# Patient Record
Sex: Female | Born: 1995
Health system: Southern US, Community
[De-identification: ages and names within clinical notes are randomized; demographics above are authoritative.]

## PROBLEM LIST (undated history)

## (undated) DIAGNOSIS — K5090 Crohn's disease, unspecified, without complications: Secondary | ICD-10-CM

---

## 2018-03-16 ENCOUNTER — Emergency Department (HOSPITAL_COMMUNITY): Payer: Self-pay

## 2018-03-16 ENCOUNTER — Encounter (HOSPITAL_COMMUNITY): Payer: Self-pay

## 2018-03-16 ENCOUNTER — Emergency Department (HOSPITAL_COMMUNITY)
Admission: EM | Admit: 2018-03-16 | Discharge: 2018-03-16 | Disposition: A | Discharge status: Home / Self Care | Payer: Self-pay | Attending: Emergency Medicine | Admitting: Emergency Medicine

## 2018-03-16 ENCOUNTER — Other Ambulatory Visit: Payer: Self-pay

## 2018-03-16 DIAGNOSIS — E8809 Other disorders of plasma-protein metabolism, not elsewhere classified: Secondary | ICD-10-CM

## 2018-03-16 DIAGNOSIS — R770 Abnormality of albumin: Secondary | ICD-10-CM | POA: Insufficient documentation

## 2018-03-16 DIAGNOSIS — Z79899 Other long term (current) drug therapy: Secondary | ICD-10-CM | POA: Insufficient documentation

## 2018-03-16 DIAGNOSIS — D649 Anemia, unspecified: Secondary | ICD-10-CM

## 2018-03-16 DIAGNOSIS — R112 Nausea with vomiting, unspecified: Secondary | ICD-10-CM

## 2018-03-16 DIAGNOSIS — R197 Diarrhea, unspecified: Secondary | ICD-10-CM | POA: Insufficient documentation

## 2018-03-16 LAB — URINALYSIS, ROUTINE W REFLEX MICROSCOPIC
Bilirubin Urine: NEGATIVE
Glucose, UA: NEGATIVE mg/dL
Hgb urine dipstick: NEGATIVE
Ketones, ur: 5 mg/dL — AB
Leukocytes, UA: NEGATIVE
Nitrite: NEGATIVE
Protein, ur: 30 mg/dL — AB
Specific Gravity, Urine: 1.025 (ref 1.005–1.030)
pH: 6 (ref 5.0–8.0)

## 2018-03-16 LAB — CBC
HCT: 30.6 % — ABNORMAL LOW (ref 36.0–46.0)
Hemoglobin: 9.9 g/dL — ABNORMAL LOW (ref 12.0–15.0)
MCH: 27.3 pg (ref 26.0–34.0)
MCHC: 32.4 g/dL (ref 30.0–36.0)
MCV: 84.3 fL (ref 78.0–100.0)
Platelets: 421 10*3/uL — ABNORMAL HIGH (ref 150–400)
RBC: 3.63 MIL/uL — ABNORMAL LOW (ref 3.87–5.11)
RDW: 14.6 % (ref 11.5–15.5)
WBC: 5.4 10*3/uL (ref 4.0–10.5)

## 2018-03-16 LAB — TYPE AND SCREEN
ABO/RH(D): O POS
Antibody Screen: NEGATIVE

## 2018-03-16 LAB — RETICULOCYTES
RBC.: 3.56 MIL/uL — ABNORMAL LOW (ref 3.87–5.11)
Retic Count, Absolute: 35.6 10*3/uL (ref 19.0–186.0)
Retic Ct Pct: 1 % (ref 0.4–3.1)

## 2018-03-16 LAB — COMPREHENSIVE METABOLIC PANEL
ALT: 9 U/L — ABNORMAL LOW (ref 14–54)
AST: 11 U/L — ABNORMAL LOW (ref 15–41)
Albumin: 1.9 g/dL — ABNORMAL LOW (ref 3.5–5.0)
Alkaline Phosphatase: 80 U/L (ref 38–126)
Anion gap: 7 (ref 5–15)
BUN: 5 mg/dL — ABNORMAL LOW (ref 6–20)
CO2: 28 mmol/L (ref 22–32)
Calcium: 8.1 mg/dL — ABNORMAL LOW (ref 8.9–10.3)
Chloride: 100 mmol/L — ABNORMAL LOW (ref 101–111)
Creatinine, Ser: 0.54 mg/dL (ref 0.44–1.00)
GFR calc Af Amer: >60 mL/min (ref >60)
GFR calc non Af Amer: >60 mL/min (ref >60)
Glucose, Bld: 76 mg/dL (ref 65–99)
Potassium: 3.6 mmol/L (ref 3.5–5.1)
Sodium: 135 mmol/L (ref 135–145)
Total Bilirubin: 0.7 mg/dL (ref 0.3–1.2)
Total Protein: 6.5 g/dL (ref 6.5–8.1)

## 2018-03-16 LAB — IRON AND TIBC
Iron: 34 ug/dL (ref 28–170)
Saturation Ratios: 33 % — ABNORMAL HIGH (ref 10.4–31.8)
TIBC: 102 ug/dL — ABNORMAL LOW (ref 250–450)
UIBC: 68 ug/dL

## 2018-03-16 LAB — FOLATE: Folate: 7.4 ng/mL (ref >5.9)

## 2018-03-16 LAB — LIPASE, BLOOD: Lipase: 19 U/L (ref 11–51)

## 2018-03-16 LAB — ABO/RH: ABO/RH(D): O POS

## 2018-03-16 LAB — I-STAT BETA HCG BLOOD, ED (MC, WL, AP ONLY): I-stat hCG, quantitative: <5 m[IU]/mL (ref <5)

## 2018-03-16 LAB — RAPID HIV SCREEN (HIV 1/2 AB+AG)
HIV 1/2 Antibodies: NONREACTIVE
HIV-1 P24 Antigen - HIV24: NONREACTIVE

## 2018-03-16 LAB — FERRITIN: Ferritin: 212 ng/mL (ref 11–307)

## 2018-03-16 LAB — TSH: TSH: 1.657 u[IU]/mL (ref 0.350–4.500)

## 2018-03-16 LAB — VITAMIN B12: Vitamin B-12: 849 pg/mL (ref 180–914)

## 2018-03-16 LAB — POC OCCULT BLOOD, ED: Fecal Occult Bld: NEGATIVE

## 2018-03-16 MED ORDER — PREDNISONE 20 MG PO TABS: 40.0000 mg | ORAL_TABLET | Freq: Every day | ORAL | 0 refills | Status: DC

## 2018-03-16 MED ORDER — PREDNISONE 20 MG PO TABS
60.0000 mg | ORAL_TABLET | ORAL | Status: AC
  Administered 2018-03-16: 60 mg via ORAL
  Filled 2018-03-16: qty 3

## 2018-03-16 MED ORDER — SODIUM CHLORIDE 0.9 % IV SOLN
Freq: Once | INTRAVENOUS | Status: AC
Start: 2018-03-16 — End: 2018-03-16
  Administered 2018-03-16: 15:00:00 via INTRAVENOUS

## 2018-03-16 MED ORDER — ONDANSETRON HCL 4 MG/2ML IJ SOLN: 4.0000 mg | Freq: Once | INTRAMUSCULAR | Status: DC

## 2018-03-16 MED ORDER — IOPAMIDOL (ISOVUE-300) INJECTION 61%
INTRAVENOUS | Status: AC
  Administered 2018-03-16: 100 mL
  Filled 2018-03-16: qty 100

## 2018-03-16 MED ORDER — PANTOPRAZOLE SODIUM 20 MG PO TBEC: 20.0000 mg | DELAYED_RELEASE_TABLET | Freq: Two times a day (BID) | ORAL | 0 refills | Status: DC

## 2018-03-16 MED ORDER — DICYCLOMINE HCL 20 MG PO TABS: 20.0000 mg | ORAL_TABLET | Freq: Two times a day (BID) | ORAL | 0 refills | Status: DC

## 2018-03-16 NOTE — ED Notes (Signed)
EDP at bedside  

## 2018-03-16 NOTE — ED Provider Notes (Signed)
[facility] EMERGENCY DEPARTMENT Provider Note   CSN: [id number] Arrival date & time: 03/16/18  1130     History   Chief Complaint Chief Complaint  Patient presents with  . Abdominal Pain  . Emesis    HPI Jennifer Cain is a 22 y.o. female.  HPI   22 yo F with PMHx below here here with abd pain, emesis. Pt reports that for the past 3-4 months, she's had progressively worsening cramp like, diffuse abd pain. Pain comes and goes, is worse after eating. She's been unable to eat/dirnk due to this and has been drinking protein shakes only. She's had >30 lb weight loss. No night sweats. No alleviating factors. She's also had diarrhea, occasionally black/melanotic. No h/o ulcers. No h/o blood transfusions. No h/o anemia. No recent travel outside the [place]. No dietary limitations/new diets.  History reviewed. No pertinent past medical history.  There are no active problems to display for this patient.   History reviewed. No pertinent surgical history.   OB History   None      Home Medications    Prior to Admission medications   Medication Sig Start Date End Date Taking? Authorizing Provider  pantoprazole (PROTONIX) 40 MG tablet Take 40 mg by mouth 2 (two) times daily.    Yes [provider]    Family History No family history on file.  Social History Social History   Tobacco Use  . Smoking status: Never Smoker  . Smokeless tobacco: Never Used  Substance Use Topics  . Alcohol use: Never    Frequency: Never  . Drug use: Never     Allergies   Patient has no known allergies.   Review of Systems Review of Systems  Constitutional: Positive for appetite change, fatigue and unexpected weight change. Negative for chills and fever.  HENT: Negative for congestion, rhinorrhea and sore throat.   Eyes: Negative for visual disturbance.  Respiratory: Positive for shortness of breath. Negative for cough and wheezing.   Cardiovascular: Positive for leg  swelling. Negative for chest pain.  Gastrointestinal: Positive for abdominal pain, diarrhea, nausea and vomiting.  Genitourinary: Negative for dysuria, flank pain, vaginal bleeding and vaginal discharge.  Musculoskeletal: Negative for neck pain.  Skin: Negative for rash.  Allergic/Immunologic: Negative for immunocompromised state.  Neurological: Positive for weakness. Negative for syncope and headaches.  Hematological: Does not bruise/bleed easily.  All other systems reviewed and are negative.    Physical Exam Updated Vital Signs BP 114/82   Pulse 98   Temp 98.7 F (37.1 C) (Oral)   Resp 16   Ht 5' 4"  (1.626 m)   Wt 59 kg (130 lb)   LMP 02/08/2018   SpO2 100%   BMI 22.31 kg/m   Physical Exam  Constitutional: She is oriented to person, place, and time. She appears well-developed and well-nourished. She has a sickly appearance. No distress.  HENT:  Head: Normocephalic and atraumatic.  Subconj pallor  Eyes: Conjunctivae are normal.  Neck: Neck supple.  Cardiovascular: Normal rate, regular rhythm and normal heart sounds. Exam reveals no friction rub.  No murmur heard. Pulmonary/Chest: Effort normal and breath sounds normal. No respiratory distress. She has no wheezes. She has no rales.  Abdominal: She exhibits no distension. There is generalized tenderness. There is no rigidity, no rebound and no guarding.  Musculoskeletal: She exhibits no edema.  Neurological: She is alert and oriented to person, place, and time. She exhibits normal muscle tone.  Skin: Skin is warm. Capillary refill takes  less than 2 seconds. There is pallor.  Psychiatric: She has a normal mood and affect.  Nursing note and vitals reviewed.    ED Treatments / Results  Labs (all labs ordered are listed, but only abnormal results are displayed) Labs Reviewed  COMPREHENSIVE METABOLIC PANEL - Abnormal; Notable for the following components:      Result Value   Chloride 100 (*)    BUN 5 (*)    Calcium 8.1  (*)    Albumin 1.9 (*)    AST 11 (*)    ALT 9 (*)    All other components within normal limits  CBC - Abnormal; Notable for the following components:   RBC 3.63 (*)    Hemoglobin 9.9 (*)    HCT 30.6 (*)    Platelets 421 (*)    All other components within normal limits  URINALYSIS, ROUTINE W REFLEX MICROSCOPIC - Abnormal; Notable for the following components:   Color, Urine AMBER (*)    Ketones, ur 5 (*)    Protein, ur 30 (*)    Bacteria, UA FEW (*)    All other components within normal limits  LIPASE, BLOOD  TSH  RAPID HIV SCREEN (HIV 1/2 AB+AG)  VITAMIN B12  FOLATE  IRON AND TIBC  FERRITIN  RETICULOCYTES  I-STAT BETA HCG BLOOD, ED ([facility], [facility], [facility] ONLY)  POC OCCULT BLOOD, ED  TYPE AND SCREEN  ABO/RH    EKG None  Radiology No results found.  Procedures Procedures (including critical care time)  Medications Ordered in ED Medications  ondansetron (ZOFRAN) injection 4 mg (0 mg Intravenous Hold 03/16/18 1537)  0.9 %  sodium chloride infusion ( Intravenous New Bag/Given 03/16/18 1528)  iopamidol (ISOVUE-300) 61 % injection (100 mLs  Contrast Given 03/16/18 1627)     Initial Impression / Assessment and Plan / ED Course  I have reviewed the triage vital signs and the nursing notes.  Pertinent labs & imaging results that were available during my care of the patient were reviewed by me and considered in my medical decision making (see chart for details).     22 yo F with no known PMhx here with weakness, fatigue, weight loss, and abd pain with n/v/d. On exam, pt appears pale, fatigued, but non-toxic. Abdomen with diffuse TTP. Lab work obtained and is c/f significant malnutrition with albumin of 1.9. CBC also shows anemia with Hgb 9.9 - last value was 12 per records, but this was several months ago. UA with ketonuria and 6-10 WBCs, but pt has no significant dysuria, frequency.   ETiology of weight loss, anemia unclear. DDx includes underlying IBD, celiac's, PUD. Given duration  of sx and extent, will check CT. Will also send anemia panel. If CT is w/o surgical abnormality, suspect pt can be managed with antacids, outpt GI follow-up. I discussed the extreme importance of f/u with GI with pt, family in detail.  Patient care transferred to Dr. Vanita Panda at the end of my shift. Patient presentation, ED course, and plan of care discussed with review of all pertinent labs and imaging. Please see his/her note for further details regarding further ED course and disposition.   Final Clinical Impressions(s) / ED Diagnoses   Final diagnoses:  Nausea vomiting and diarrhea  Anemia, unspecified type  Hypoalbuminemia    ED Discharge Orders    None       Duffy Bruce, MD 03/16/18 [phone]

## 2018-03-16 NOTE — ED Notes (Signed)
Patient transported to CT 

## 2018-03-16 NOTE — ED Triage Notes (Signed)
PT reports abdominal pain, n/v, diarrhea x several months. Pt reports having burning in legs and unable to stand for long period of time.

## 2018-03-16 NOTE — Discharge Instructions (Signed)
As discussed, today's evaluation has been generally reassuring, though there is some consideration of an inflammatory condition in your intestines. It is important that you take medication as directed, and be sure to follow-up with our gastroenterology colleagues. Please call 1 of the groups listed above, to arrange for follow-up care.  Return here for concerning changes in your condition.

## 2018-03-16 NOTE — ED Notes (Signed)
Unsuccessful IV attempt x2.  

## 2018-03-16 NOTE — ED Notes (Signed)
EDP at beside attempting IV [place]

## 2018-03-16 NOTE — ED Provider Notes (Signed)
6:26 PM Patient is awake alert, sitting upright, accompanied by her parents. We discussed CT results, prior findings, prior evaluation. Patient has not seen a gastroenterologist since her initial presentation to another healthcare facility. CT suggests inflammatory bowel condition, consistent with patient's history of ongoing gastrointestinal issues, nausea, vomiting, diarrhea, weight loss. No evidence for malignancy, mass. Patient remains hemodynamically unremarkable. Patient will start Bentyl, steroids, follow-up with GI.   Carmin Muskrat, MD 03/16/18 1827

## 2018-03-16 NOTE — ED Notes (Signed)
Patient verbalizes understanding of discharge instructions. Opportunity for questioning and answers were provided. Armband removed by staff, pt discharged from ED.  

## 2018-04-12 ENCOUNTER — Encounter (HOSPITAL_COMMUNITY): Payer: Self-pay

## 2018-04-12 ENCOUNTER — Other Ambulatory Visit: Payer: Self-pay

## 2018-04-12 ENCOUNTER — Emergency Department (HOSPITAL_COMMUNITY)
Admission: EM | Admit: 2018-04-12 | Discharge: 2018-04-12 | Disposition: A | Discharge status: Home / Self Care | Payer: Self-pay | Attending: Emergency Medicine | Admitting: Emergency Medicine

## 2018-04-12 ENCOUNTER — Emergency Department (HOSPITAL_COMMUNITY): Payer: Self-pay

## 2018-04-12 DIAGNOSIS — R1010 Upper abdominal pain, unspecified: Secondary | ICD-10-CM | POA: Insufficient documentation

## 2018-04-12 DIAGNOSIS — Z79899 Other long term (current) drug therapy: Secondary | ICD-10-CM | POA: Insufficient documentation

## 2018-04-12 DIAGNOSIS — R109 Unspecified abdominal pain: Secondary | ICD-10-CM

## 2018-04-12 DIAGNOSIS — R112 Nausea with vomiting, unspecified: Secondary | ICD-10-CM | POA: Insufficient documentation

## 2018-04-12 LAB — COMPREHENSIVE METABOLIC PANEL
ALT: 7 U/L — ABNORMAL LOW (ref 14–54)
AST: 9 U/L — ABNORMAL LOW (ref 15–41)
Albumin: 2.2 g/dL — ABNORMAL LOW (ref 3.5–5.0)
Alkaline Phosphatase: 72 U/L (ref 38–126)
Anion gap: 10 (ref 5–15)
BUN: 6 mg/dL (ref 6–20)
CO2: 25 mmol/L (ref 22–32)
Calcium: 8.9 mg/dL (ref 8.9–10.3)
Chloride: 101 mmol/L (ref 101–111)
Creatinine, Ser: 0.71 mg/dL (ref 0.44–1.00)
GFR calc Af Amer: >60 mL/min (ref >60)
GFR calc non Af Amer: >60 mL/min (ref >60)
Glucose, Bld: 75 mg/dL (ref 65–99)
Potassium: 4.1 mmol/L (ref 3.5–5.1)
Sodium: 136 mmol/L (ref 135–145)
Total Bilirubin: 0.8 mg/dL (ref 0.3–1.2)
Total Protein: 7.7 g/dL (ref 6.5–8.1)

## 2018-04-12 LAB — CBC
HCT: 32.4 % — ABNORMAL LOW (ref 36.0–46.0)
Hemoglobin: 10.4 g/dL — ABNORMAL LOW (ref 12.0–15.0)
MCH: 27.5 pg (ref 26.0–34.0)
MCHC: 32.1 g/dL (ref 30.0–36.0)
MCV: 85.7 fL (ref 78.0–100.0)
Platelets: 582 10*3/uL — ABNORMAL HIGH (ref 150–400)
RBC: 3.78 MIL/uL — ABNORMAL LOW (ref 3.87–5.11)
RDW: 16.4 % — ABNORMAL HIGH (ref 11.5–15.5)
WBC: 5.2 10*3/uL (ref 4.0–10.5)

## 2018-04-12 LAB — I-STAT BETA HCG BLOOD, ED (MC, WL, AP ONLY): I-stat hCG, quantitative: <5 m[IU]/mL (ref <5)

## 2018-04-12 LAB — LIPASE, BLOOD: Lipase: 19 U/L (ref 11–51)

## 2018-04-12 MED ORDER — ONDANSETRON 4 MG PO TBDP
4.0000 mg | ORAL_TABLET | Freq: Once | ORAL | Status: AC
  Administered 2018-04-12: 4 mg via ORAL
  Filled 2018-04-12: qty 1

## 2018-04-12 MED ORDER — ONDANSETRON 4 MG PO TBDP: 4.0000 mg | ORAL_TABLET | Freq: Three times a day (TID) | ORAL | 0 refills | Status: DC | PRN

## 2018-04-12 NOTE — ED Triage Notes (Signed)
Pt states she was seen here last month and was diagnosed with Chron's disease. Pt states over the last month she has had increased fatigue, vomiting, and just general malaise. Pt appears fatigued in triage. Slightly tachycardic but afebrile.

## 2018-04-12 NOTE — Discharge Instructions (Signed)
Please read and follow all provided instructions.  Your diagnoses today include:  1. Pain of upper abdomen   2. Abdominal pain   3. Non-intractable vomiting with nausea, unspecified vomiting type    Tests performed today include:  Blood counts and electrolytes  Blood tests to check liver and kidney function  Test for pregnancy (in women)  Vital signs. See below for your results today.   Medications prescribed:   Zofran (ondansetron) - for nausea and vomiting  Take any prescribed medications only as directed.  Home care instructions:   Follow any educational materials contained in this packet.  Follow-up instructions: Please follow-up with your primary care provider in the next 3 days for further evaluation of your symptoms.    Return instructions:  SEEK IMMEDIATE MEDICAL ATTENTION IF:  The pain does not go away or becomes severe   A temperature above 101F develops   Repeated vomiting occurs (multiple episodes)   The pain becomes localized to portions of the abdomen. The right side could possibly be appendicitis. In an adult, the left lower portion of the abdomen could be colitis or diverticulitis.   Blood is being passed in stools or vomit (bright red or black tarry stools)   You develop chest pain, difficulty breathing, dizziness or fainting, or become confused, poorly responsive, or inconsolable (young children)  If you have any other emergent concerns regarding your health  Additional Information: Abdominal (belly) pain can be caused by many things. Your caregiver performed an examination and possibly ordered blood/urine tests and imaging (CT scan, x-rays, ultrasound). Many cases can be observed and treated at home after initial evaluation in the emergency department. Even though you are being discharged home, abdominal pain can be unpredictable. Therefore, you need a repeated exam if your pain does not resolve, returns, or worsens. Most patients with abdominal pain  don't have to be admitted to the hospital or have surgery, but serious problems like appendicitis and gallbladder attacks can start out as nonspecific pain. Many abdominal conditions cannot be diagnosed in one visit, so follow-up evaluations are very important.  Your vital signs today were: BP 119/89 (BP Location: Right Arm)    Pulse 99    Temp 98.3 F (36.8 C) (Oral)    Resp 16    LMP 02/10/2018 (Within Days)    SpO2 100%  If your blood pressure (bp) was elevated above 135/85 this visit, please have this repeated by your doctor within one month. --------------

## 2018-04-12 NOTE — ED Provider Notes (Signed)
[facility] EMERGENCY DEPARTMENT Provider Note   CSN: [id number] Arrival date & time: 04/12/18  1039     History   Chief Complaint Chief Complaint  Patient presents with  . Abdominal Pain    HPI Jennifer Cain is a 22 y.o. female.  Patient with no past surgical history presents to the emergency department with complaint of abdominal pain, nausea, and vomiting.  Patient was seen in the emergency department on 03/16/2018.  At that time she had a CT scan showing inflammatory changes.  Patient states that that time she was told she may have Crohn's disease.  She has not followed up with a GI physician and is never had a definitive diagnosis of this.  However, she continues to feel nauseous and have intermittent episodes of vomiting.  She is able to drink some fluids.  She denies any fevers.  Pain is described as waxing and waning with radiation into the chest.  It is in the upper abdomen.  No lower abdominal pain.  No diarrhea, bloody stools, or melena.  No urinary symptoms.  No easy bruising or bleeding reported.  At her last visit patient was prescribed Bentyl, prednisone, Protonix.  She states that these have helped "a little bit".  She states that she called at the 3 GI referral that she was given and was told that she needed a referral.  She does not have a primary care doctor. Denies heavy NSAID or EtOH use.      History reviewed. No pertinent past medical history.  There are no active problems to display for this patient.   History reviewed. No pertinent surgical history.   OB History   None      Home Medications    Prior to Admission medications   Medication Sig Start Date End Date Taking? Authorizing Provider  dicyclomine (BENTYL) 20 MG tablet Take 1 tablet (20 mg total) by mouth 2 (two) times daily. 03/16/18   Carmin Muskrat, MD  pantoprazole (PROTONIX) 20 MG tablet Take 1 tablet (20 mg total) by mouth 2 (two) times daily. 03/16/18   Carmin Muskrat, MD   predniSONE (DELTASONE) 20 MG tablet Take 2 tablets (40 mg total) by mouth daily with breakfast. For the next four days 03/16/18   Carmin Muskrat, MD    Family History History reviewed. No pertinent family history.  Social History Social History   Tobacco Use  . Smoking status: Never Smoker  . Smokeless tobacco: Never Used  Substance Use Topics  . Alcohol use: Never    Frequency: Never  . Drug use: Never     Allergies   Patient has no known allergies.   Review of Systems Review of Systems  Constitutional: Negative for fever.  HENT: Negative for rhinorrhea and sore throat.   Eyes: Negative for redness.  Respiratory: Negative for cough.   Cardiovascular: Positive for chest pain (radiation from abdomen).  Gastrointestinal: Positive for abdominal pain, nausea and vomiting. Negative for diarrhea.  Genitourinary: Negative for dysuria.  Musculoskeletal: Negative for myalgias.  Skin: Negative for rash.  Neurological: Negative for headaches.     Physical Exam Updated Vital Signs BP 119/89 (BP Location: Right Arm)   Pulse 99   Temp 98.3 F (36.8 C) (Oral)   Resp 16   LMP 02/10/2018 (Within Days)   SpO2 100%   Physical Exam  Constitutional: She appears well-developed and well-nourished.  HENT:  Head: Normocephalic and atraumatic.  Eyes: Conjunctivae are normal. Right eye exhibits no discharge. Left eye  exhibits no discharge.  Neck: Normal range of motion. Neck supple.  Cardiovascular: Normal rate, regular rhythm and normal heart sounds.  Pulmonary/Chest: Effort normal and breath sounds normal.  Abdominal: Soft. Bowel sounds are normal. There is tenderness (mild) in the right upper quadrant, epigastric area and left upper quadrant. There is no rebound, no guarding, no CVA tenderness, no tenderness at McBurney's point and negative Murphy's sign.  Neurological: She is alert.  Skin: Skin is warm and dry.  Psychiatric: She has a normal mood and affect.  Nursing note and  vitals reviewed.    ED Treatments / Results  Labs (all labs ordered are listed, but only abnormal results are displayed) Labs Reviewed  COMPREHENSIVE METABOLIC PANEL - Abnormal; Notable for the following components:      Result Value   Albumin 2.2 (*)    AST 9 (*)    ALT 7 (*)    All other components within normal limits  CBC - Abnormal; Notable for the following components:   RBC 3.78 (*)    Hemoglobin 10.4 (*)    HCT 32.4 (*)    RDW 16.4 (*)    Platelets 582 (*)    All other components within normal limits  LIPASE, BLOOD  URINALYSIS, ROUTINE W REFLEX MICROSCOPIC  I-STAT BETA HCG BLOOD, ED ([facility], [facility], [facility] ONLY)    EKG None  Radiology No results found.  Procedures Procedures (including critical care time)  Medications Ordered in ED Medications  ondansetron (ZOFRAN-ODT) disintegrating tablet 4 mg (4 mg Oral Given 04/12/18 1614)     Initial Impression / Assessment and Plan / ED Course  I have reviewed the triage vital signs and the nursing notes.  Pertinent labs & imaging results that were available during my care of the patient were reviewed by me and considered in my medical decision making (see chart for details).     Patient seen and examined.  Labs reviewed.  Given that pain includes the right upper quadrant and is intermittent in nature, will evaluate for gallstones with right upper quadrant ultrasound.  Will give Zofran and oral fluid challenge.  Labs today are reassuring. They seem to be at the patient's baseline with mild anemia. She has low albumin and c/o weight loss.  Unfortunately we are towards the end of an appropriate emergency department work-up and patient will need to be seen by GI for further evaluation. Hopefully she will be able to establish care with referral today. I am not sure why they didn't schedule patient for an appointment after her last visit if she did indeed call.   If patient is eating and drinking and ultrasound is reassuring, plan for  discharge to home with antiemetics, GI and primary care follow-up.  Vital signs reviewed and are as follows: BP 119/89 (BP Location: Right Arm)   Pulse 99   Temp 98.3 F (36.8 C) (Oral)   Resp 16   LMP 02/10/2018 (Within Days)   SpO2 100%   5:57 PM  Patient has been waiting several hours for her ultrasound.  She states that she would like to leave.  We will discharged home with Zofran.  She will continue other medications that she is taking at home as needed.  Patient has not had any additional.vomiting here.  Also given referrals for primary care and GI.  The patient was urged to return to the Emergency Department immediately with worsening of current symptoms, worsening abdominal pain, persistent vomiting, blood noted in stools, fever, or any other concerns.  The patient verbalized understanding.    Final Clinical Impressions(s) / ED Diagnoses   Final diagnoses:  Abdominal pain  Pain of upper abdomen  Non-intractable vomiting with nausea, unspecified vomiting type   Abd pain: Patient with persistent symptoms that are now chronic in nature.  Patient needs GI follow-up.  Labs today are unremarkable.  Reviewed previous CT scan.  We will continue to treat symptoms the best we can.  No indications for emergent hospitalization or GI consultation.  Referrals given.  Return instructions as above.   ED Discharge Orders        Ordered    ondansetron (ZOFRAN ODT) 4 MG disintegrating tablet  Every 8 hours PRN     04/12/18 1752       Carlisle Cater, PA-C 04/12/18 Mikel Cella, MD 04/12/18 2104

## 2018-04-12 NOTE — ED Notes (Signed)
Pt made aware of need for urine sample. Pt given urine specimen cup, but unable to provide sample at this time.

## 2018-04-12 NOTE — ED Notes (Signed)
Patient verbalizes understanding of discharge instructions. Opportunity for questioning and answers were provided. Armband removed by staff, pt discharged from ED.  

## 2018-05-06 ENCOUNTER — Ambulatory Visit: Payer: Self-pay | Admitting: Internal Medicine

## 2018-06-17 ENCOUNTER — Encounter: Payer: Self-pay | Admitting: Internal Medicine

## 2018-06-17 ENCOUNTER — Ambulatory Visit: Payer: Self-pay | Attending: Internal Medicine | Admitting: Internal Medicine

## 2018-06-17 VITALS — BP 128/88 | HR 106 | Temp 98.5°F | Resp 16 | Ht 63.0 in | Wt 109.0 lb

## 2018-06-17 DIAGNOSIS — R0989 Other specified symptoms and signs involving the circulatory and respiratory systems: Secondary | ICD-10-CM | POA: Insufficient documentation

## 2018-06-17 DIAGNOSIS — R634 Abnormal weight loss: Secondary | ICD-10-CM | POA: Insufficient documentation

## 2018-06-17 DIAGNOSIS — R109 Unspecified abdominal pain: Secondary | ICD-10-CM | POA: Insufficient documentation

## 2018-06-17 DIAGNOSIS — G8929 Other chronic pain: Secondary | ICD-10-CM | POA: Insufficient documentation

## 2018-06-17 DIAGNOSIS — R918 Other nonspecific abnormal finding of lung field: Secondary | ICD-10-CM | POA: Insufficient documentation

## 2018-06-17 DIAGNOSIS — N911 Secondary amenorrhea: Secondary | ICD-10-CM | POA: Insufficient documentation

## 2018-06-17 DIAGNOSIS — Z79899 Other long term (current) drug therapy: Secondary | ICD-10-CM | POA: Insufficient documentation

## 2018-06-17 DIAGNOSIS — D649 Anemia, unspecified: Secondary | ICD-10-CM | POA: Insufficient documentation

## 2018-06-17 LAB — POCT URINE PREGNANCY: Preg Test, Ur: NEGATIVE

## 2018-06-17 MED ORDER — MEDROXYPROGESTERONE ACETATE 10 MG PO TABS: 10.0000 mg | ORAL_TABLET | Freq: Every day | ORAL | 0 refills | Status: DC

## 2018-06-17 MED FILL — MEDROXYPROGESTERONE 10 MG T: 10 | 7 days supply | Qty: 7 | Fill #0

## 2018-06-17 NOTE — Progress Notes (Signed)
Pt states she hasn't had her menstrual since February

## 2018-06-17 NOTE — Progress Notes (Signed)
Patient ID: Jennifer Cain, female    DOB: [birth year]  MRN: [id number]  CC: New Patient (Initial Visit) and Hospitalization Follow-up (ED f/u)   Subjective: Jennifer Cain is a 22 y.o. female who presents for ER follow-up and to establish with me as PCP. Her concerns today include:   Pt c/o intermittent severe abdominal pains x 2 mths -In epigastric and lower abdomin BL -occurs with meals, no particular type of foods -vomiting and diarrhea sometimes with the pain. No blood in stools, no fever.  No international travel in past 6 mths. She does not drink water from [place] or [place].  + wgh loss of 21 lbs since April.   No bloating or excessive gas -no fhx of IBD -Not depressed but did have positive depression screen -Seen in the emergency room 3 times since March for this.  CAT scan of the abdomen revealed: IMPRESSION: 1. Circumferential wall thickening of the mid and distal small bowel compatible with enteritis, potentially infectious or inflammatory in etiology. 2. Multiple enlarged small bowel mesenteric lymph nodes may be reactive in etiology. 3. Bilateral lower lobe ground-glass nodularity may be infectious or inflammatory in etiology.  No menses since 12/2017.  Menses were regular up to that point.  Not on any BC method.  Not sexually active and has not been in the past.  Menses last 3 days, moderate bleeding the first day, light the second day and then stops by the third day. -menses started at age 73-15 yrs Soreness in legs  I question her about any respiratory symptoms given the groundglass nodularity seen in the lower lobes of the lungs on CAT scan of the abdomen done back in April.  She reports shortness of breath sometimes.  No cough.      Current Outpatient Medications on File Prior to Visit  Medication Sig Dispense Refill  . dicyclomine (BENTYL) 20 MG tablet Take 1 tablet (20 mg total) by mouth 2 (two) times daily. (Patient not taking: Reported on 06/17/2018) 20 tablet 0  .  ondansetron (ZOFRAN ODT) 4 MG disintegrating tablet Take 1 tablet (4 mg total) by mouth every 8 (eight) hours as needed for nausea or vomiting. (Patient not taking: Reported on 06/17/2018) 10 tablet 0  . pantoprazole (PROTONIX) 20 MG tablet Take 1 tablet (20 mg total) by mouth 2 (two) times daily. (Patient not taking: Reported on 06/17/2018) 30 tablet 0  . predniSONE (DELTASONE) 20 MG tablet Take 2 tablets (40 mg total) by mouth daily with breakfast. For the next four days (Patient not taking: Reported on 06/17/2018) 8 tablet 0   No current facility-administered medications on file prior to visit.     No Known Allergies  Social History   Socioeconomic History  . Marital status: Single    Spouse name: Not on file  . Number of children: Not on file  . Years of education: Not on file  . Highest education level: Not on file  Occupational History  . Not on file  Social Needs  . Financial resource strain: Not on file  . Food insecurity:    Worry: Not on file    Inability: Not on file  . Transportation needs:    Medical: Not on file    Non-medical: Not on file  Tobacco Use  . Smoking status: Never Smoker  . Smokeless tobacco: Never Used  Substance and Sexual Activity  . Alcohol use: Never    Frequency: Never  . Drug use: Never  . Sexual activity: Not  on file  Lifestyle  . Physical activity:    Days per week: Not on file    Minutes per session: Not on file  . Stress: Not on file  Relationships  . Social connections:    Talks on phone: Not on file    Gets together: Not on file    Attends religious service: Not on file    Active member of club or organization: Not on file    Attends meetings of clubs or organizations: Not on file    Relationship status: Not on file  . Intimate partner violence:    Fear of current or ex partner: Not on file    Emotionally abused: Not on file    Physically abused: Not on file    Forced sexual activity: Not on file  Other Topics Concern  . Not  on file  Social History Narrative  . Not on file    No family history on file.  No past surgical history on file.  ROS: Review of Systems MSK: Denies any joint aches or stiffness.  No joint pains or swelling.  She sometimes gets soreness in her legs when she lays down and some tingling in the feet.  PHYSICAL EXAM: BP 128/88   Pulse (!) 106   Temp 98.5 F (36.9 C) (Oral)   Resp 16   Ht 5' 3"  (1.6 m)   Wt 109 lb (49.4 kg)   SpO2 100%   BMI 19.31 kg/m   Wt Readings from Last 3 Encounters:  06/17/18 109 lb (49.4 kg)  03/16/18 130 lb (59 kg)    Physical Exam  General appearance -young African-American female in NAD.   Mental status - normal mood, behavior, speech, dress, motor activity, and thought processes Eyes -pale conjunctiva Mouth - mucous membranes moist, pharynx normal without lesions Neck -no cervical lymphadenopathy.  No thyromegaly or thyroid nodules.   Lymphatics - no palpable lymphadenopathy, no hepatosplenomegaly Chest - clear to auscultation, no wheezes, rales or rhonchi, symmetric air entry Heart - normal rate, regular rhythm, normal S1, S2, no murmurs, rubs, clicks or gallops Abdomen - soft, nontender, nondistended, no masses or organomegaly Musculoskeletal - no joint tenderness, deformity or swelling Extremities -trace lower extremity edema   Depression screen [facility] 2/9 06/17/2018  Decreased Interest 2  Down, Depressed, Hopeless 2  PHQ - 2 Score 4  Altered sleeping 3  Tired, decreased energy 3  Change in appetite 2  Feeling bad or failure about yourself  0  Trouble concentrating 0  Moving slowly or fidgety/restless 0  Suicidal thoughts 0  PHQ-9 Score 12   Lab Results  Component Value Date   WBC 5.2 04/12/2018   HGB 10.4 (L) 04/12/2018   HCT 32.4 (L) 04/12/2018   MCV 85.7 04/12/2018   PLT 582 (H) 04/12/2018   Lab Results  Component Value Date   TSH 1.657 03/16/2018   Lab Results  Component Value Date   IRON 34 03/16/2018   TIBC 102 (L)  03/16/2018   FERRITIN 212 03/16/2018     Chemistry      Component Value Date/Time   NA 136 04/12/2018 1113   K 4.1 04/12/2018 1113   CL 101 04/12/2018 1113   CO2 25 04/12/2018 1113   BUN 6 04/12/2018 1113   CREATININE 0.71 04/12/2018 1113      Component Value Date/Time   CALCIUM 8.9 04/12/2018 1113   ALKPHOS 72 04/12/2018 1113   AST 9 (L) 04/12/2018 1113   ALT 7 (L)  04/12/2018 1113   BILITOT 0.8 04/12/2018 1113     HIV test negative in April Results for orders placed or performed in visit on 06/17/18  POCT urine pregnancy  Result Value Ref Range   Preg Test, Ur Negative Negative    ASSESSMENT AND PLAN: 1. Chronic abdominal pain 2. Unexplained weight loss -Given the abnormal findings seen on CAT scan I think we need to get her to a gastroenterologist.  She will apply for the orange card/cone discount ASAP.  I will bring her back in about 3 to 4 weeks and hopefully we can refer her at that time when she is approved. -We will screen for celiac disease. We will also need to be screened for connective tissue diseases given findings seen in the lower lobes of the lungs. - Sedimentation Rate - Tissue Transglutaminase Abs,IgG,IgA - ANA w/Reflex if Positive -IGRA  3. Secondary amenorrhea We will give her Provera challenge. - TSH - Follicle stimulating hormone - Luteinizing hormone - Prolactin - POCT urine pregnancy  4. Abnormal finding of lung - DG Chest 2 View; Future  5. Anemia, unspecified type - CBC  Patient was given the opportunity to ask questions.  Patient verbalized understanding of the plan and was able to repeat key elements of the plan.   Orders Placed This Encounter  Procedures  . DG Chest 2 View  . TSH  . Follicle stimulating hormone  . Luteinizing hormone  . Prolactin  . CBC  . Sedimentation Rate  . Tissue Transglutaminase Abs,IgG,IgA  . ANA w/Reflex if Positive  . POCT urine pregnancy     Requested Prescriptions   Signed Prescriptions  Disp Refills  . medroxyPROGESTERone (PROVERA) 10 MG tablet 7 tablet 0    Sig: Take 1 tablet (10 mg total) by mouth daily.    Return in about 3 weeks (around 07/08/2018).  Karle Plumber, MD, FACP

## 2018-06-19 ENCOUNTER — Other Ambulatory Visit: Payer: Self-pay | Admitting: Internal Medicine

## 2018-06-19 DIAGNOSIS — N911 Secondary amenorrhea: Secondary | ICD-10-CM

## 2018-06-19 DIAGNOSIS — D649 Anemia, unspecified: Secondary | ICD-10-CM

## 2018-06-19 DIAGNOSIS — R634 Abnormal weight loss: Secondary | ICD-10-CM

## 2018-06-20 ENCOUNTER — Ambulatory Visit: Payer: Self-pay | Attending: Internal Medicine

## 2018-06-20 DIAGNOSIS — R109 Unspecified abdominal pain: Secondary | ICD-10-CM | POA: Insufficient documentation

## 2018-06-20 DIAGNOSIS — N911 Secondary amenorrhea: Secondary | ICD-10-CM | POA: Insufficient documentation

## 2018-06-20 DIAGNOSIS — D649 Anemia, unspecified: Secondary | ICD-10-CM | POA: Insufficient documentation

## 2018-06-20 DIAGNOSIS — R0989 Other specified symptoms and signs involving the circulatory and respiratory systems: Secondary | ICD-10-CM | POA: Insufficient documentation

## 2018-06-20 DIAGNOSIS — R634 Abnormal weight loss: Secondary | ICD-10-CM | POA: Insufficient documentation

## 2018-06-20 DIAGNOSIS — G8929 Other chronic pain: Secondary | ICD-10-CM | POA: Insufficient documentation

## 2018-06-20 NOTE — Progress Notes (Signed)
Patient here for lab visit only 

## 2018-06-21 LAB — CBC

## 2018-06-21 LAB — TSH: TSH: 1.5 u[IU]/mL (ref 0.450–4.500)

## 2018-06-21 LAB — SEDIMENTATION RATE

## 2018-06-21 LAB — LUTEINIZING HORMONE: LH: 0.8 m[IU]/mL

## 2018-06-21 LAB — TISSUE TRANSGLUTAMINASE ABS,IGG,IGA
Tissue Transglut Ab: 7 U/mL — ABNORMAL HIGH (ref 0–5)
Transglutaminase IgA: <2 U/mL (ref 0–3)

## 2018-06-21 LAB — ANA W/REFLEX IF POSITIVE: Anti Nuclear Antibody(ANA): NEGATIVE

## 2018-06-21 LAB — FOLLICLE STIMULATING HORMONE: FSH: 8 m[IU]/mL

## 2018-06-21 LAB — PROLACTIN: Prolactin: 9.8 ng/mL (ref 4.8–23.3)

## 2018-06-26 LAB — CBC
Hematocrit: 30.3 % — ABNORMAL LOW (ref 34.0–46.6)
Hemoglobin: 10 g/dL — ABNORMAL LOW (ref 11.1–15.9)
MCH: 28 pg (ref 26.6–33.0)
MCHC: 33 g/dL (ref 31.5–35.7)
MCV: 85 fL (ref 79–97)
Platelets: 235 10*3/uL (ref 150–450)
RBC: 3.57 x10E6/uL — ABNORMAL LOW (ref 3.77–5.28)
RDW: 15.1 % (ref 12.3–15.4)
WBC: 4.4 10*3/uL (ref 3.4–10.8)

## 2018-06-26 LAB — SEDIMENTATION RATE: Sed Rate: 42 mm/hr — ABNORMAL HIGH (ref 0–32)

## 2018-06-26 LAB — QUANTIFERON-TB GOLD PLUS
QuantiFERON Mitogen Value: 0.78 IU/mL
QuantiFERON Nil Value: 0.04 IU/mL
QuantiFERON TB1 Ag Value: 0.06 IU/mL
QuantiFERON TB2 Ag Value: 0.04 IU/mL
QuantiFERON-TB Gold Plus: NEGATIVE

## 2018-06-26 LAB — TISSUE TRANSGLUTAMINASE ABS,IGG,IGA
Tissue Transglut Ab: 7 U/mL — ABNORMAL HIGH (ref 0–5)
Transglutaminase IgA: <2 U/mL (ref 0–3)

## 2018-06-26 LAB — ESTRADIOL: Estradiol: 34.2 pg/mL

## 2018-06-27 ENCOUNTER — Ambulatory Visit: Payer: Self-pay | Attending: Internal Medicine

## 2018-07-09 ENCOUNTER — Encounter: Payer: Self-pay | Admitting: Internal Medicine

## 2018-07-09 ENCOUNTER — Ambulatory Visit: Payer: Self-pay | Attending: Internal Medicine | Admitting: Internal Medicine

## 2018-07-09 VITALS — BP 132/88 | HR 93 | Temp 98.3°F | Resp 16 | Wt 105.2 lb

## 2018-07-09 DIAGNOSIS — Z7952 Long term (current) use of systemic steroids: Secondary | ICD-10-CM | POA: Insufficient documentation

## 2018-07-09 DIAGNOSIS — Z09 Encounter for follow-up examination after completed treatment for conditions other than malignant neoplasm: Secondary | ICD-10-CM | POA: Insufficient documentation

## 2018-07-09 DIAGNOSIS — G8929 Other chronic pain: Secondary | ICD-10-CM | POA: Insufficient documentation

## 2018-07-09 DIAGNOSIS — Z23 Encounter for immunization: Secondary | ICD-10-CM | POA: Insufficient documentation

## 2018-07-09 DIAGNOSIS — R634 Abnormal weight loss: Secondary | ICD-10-CM | POA: Insufficient documentation

## 2018-07-09 DIAGNOSIS — Z79899 Other long term (current) drug therapy: Secondary | ICD-10-CM | POA: Insufficient documentation

## 2018-07-09 DIAGNOSIS — K529 Noninfective gastroenteritis and colitis, unspecified: Secondary | ICD-10-CM

## 2018-07-09 DIAGNOSIS — R109 Unspecified abdominal pain: Secondary | ICD-10-CM | POA: Insufficient documentation

## 2018-07-09 DIAGNOSIS — R197 Diarrhea, unspecified: Secondary | ICD-10-CM | POA: Insufficient documentation

## 2018-07-09 NOTE — Patient Instructions (Signed)

## 2018-07-09 NOTE — Progress Notes (Signed)
Patient ID: Jennifer Cain, female    DOB: February 02, 1996  MRN: [id number]  CC: Follow-up   Subjective: Jennifer Cain is a 22 y.o. female who presents for 6 weeks follow-up visit Her concerns today include:   Patient with history of chronic abdominal pain, unexplained weight loss, secondary amenorrhea, anemia. Since last visit she has lost additional weight.  Screening test for celiac revealed mild elevation in tissue transglutaminase antibody.  QuantiFERON gold was negative.  Hormone levels suggest the secondary amenorrhea was most likely due to weight loss. -She has applied for the orange card/cone discount on his awaiting a letter. She continues to be bothered by chronic abdominal pain.  She reports that everything she eats comes out as diarrhea.  Reports good appetite. -She did not go to have the chest x-ray done that was ordered on last visit.  She was noted to have some bilateral lower lobe nodules on CT angiogram of the chest that was done back in April  Current Outpatient Medications on File Prior to Visit  Medication Sig Dispense Refill  . dicyclomine (BENTYL) 20 MG tablet Take 1 tablet (20 mg total) by mouth 2 (two) times daily. (Patient not taking: Reported on 06/17/2018) 20 tablet 0  . medroxyPROGESTERone (PROVERA) 10 MG tablet Take 1 tablet (10 mg total) by mouth daily. 7 tablet 0  . ondansetron (ZOFRAN ODT) 4 MG disintegrating tablet Take 1 tablet (4 mg total) by mouth every 8 (eight) hours as needed for nausea or vomiting. (Patient not taking: Reported on 06/17/2018) 10 tablet 0  . pantoprazole (PROTONIX) 20 MG tablet Take 1 tablet (20 mg total) by mouth 2 (two) times daily. (Patient not taking: Reported on 06/17/2018) 30 tablet 0  . predniSONE (DELTASONE) 20 MG tablet Take 2 tablets (40 mg total) by mouth daily with breakfast. For the next four days (Patient not taking: Reported on 06/17/2018) 8 tablet 0   No current facility-administered medications on file prior to visit.     No  Known Allergies  Social History   Socioeconomic History  . Marital status: Single    Spouse name: Not on file  . Number of children: Not on file  . Years of education: Not on file  . Highest education level: Not on file  Occupational History  . Not on file  Social Needs  . Financial resource strain: Not on file  . Food insecurity:    Worry: Not on file    Inability: Not on file  . Transportation needs:    Medical: Not on file    Non-medical: Not on file  Tobacco Use  . Smoking status: Never Smoker  . Smokeless tobacco: Never Used  Substance and Sexual Activity  . Alcohol use: Never    Frequency: Never  . Drug use: Never  . Sexual activity: Not on file  Lifestyle  . Physical activity:    Days per week: Not on file    Minutes per session: Not on file  . Stress: Not on file  Relationships  . Social connections:    Talks on phone: Not on file    Gets together: Not on file    Attends religious service: Not on file    Active member of club or organization: Not on file    Attends meetings of clubs or organizations: Not on file    Relationship status: Not on file  . Intimate partner violence:    Fear of current or ex partner: Not on file  Emotionally abused: Not on file    Physically abused: Not on file    Forced sexual activity: Not on file  Other Topics Concern  . Not on file  Social History Narrative  . Not on file    No family history on file.  No past surgical history on file.  ROS: Review of Systems  PHYSICAL EXAM: BP 132/88   Pulse 93   Temp 98.3 F (36.8 C) (Oral)   Resp 16   Wt 105 lb 3.2 oz (47.7 kg)   SpO2 100%   BMI 18.64 kg/m   Wt Readings from Last 3 Encounters:  07/09/18 105 lb 3.2 oz (47.7 kg)  06/17/18 109 lb (49.4 kg)  03/16/18 130 lb (59 kg)    Physical Exam  General appearance - alert, young African-American female in NAD.  She appears malnourished  Mental status - normal mood, behavior, speech, dress, motor activity, and  thought processes Mouth: No dental cavities seen.  Oral mucosa is moist   ASSESSMENT AND PLAN: 1. Unexplained weight loss We will move forward with referral to GI pending approval of the orange card/cone discount.  Given that the transglutaminase was mildly elevated, will follow it up with gliadin D emanated antibody. -I doubt this is a chronic parasitic infection like Giardia as it usually does not cause weight loss - Gliadin Deamidated Pept Ab,IgA - Ambulatory referral to Gastroenterology  2. Chronic diarrhea - Gliadin Deamidated Pept Ab,IgA - Ambulatory referral to Gastroenterology  3. Chronic abdominal pain - Ambulatory referral to Gastroenterology  4. Need for immunization against influenza - Flu Vaccine QUAD 36+ mos IM  Patient was given the opportunity to ask questions.  Patient verbalized understanding of the plan and was able to repeat key elements of the plan.   Orders Placed This Encounter  Procedures  . Flu Vaccine QUAD 36+ mos IM  . Gliadin Deamidated Pept Ab,IgA  . Ambulatory referral to Gastroenterology     Requested Prescriptions    No prescriptions requested or ordered in this encounter    Return in about 2 months (around 09/08/2018).  Karle Plumber, MD, FACP

## 2018-07-10 LAB — GLIADIN DEAMIDATED PEPT AB,IGA: Antigliadin Abs, IgA: 9 units (ref 0–19)

## 2018-07-16 ENCOUNTER — Encounter: Payer: Self-pay | Admitting: Gastroenterology

## 2018-07-18 ENCOUNTER — Emergency Department (HOSPITAL_BASED_OUTPATIENT_CLINIC_OR_DEPARTMENT_OTHER): Payer: Self-pay

## 2018-07-18 ENCOUNTER — Encounter (HOSPITAL_BASED_OUTPATIENT_CLINIC_OR_DEPARTMENT_OTHER): Payer: Self-pay | Admitting: Emergency Medicine

## 2018-07-18 ENCOUNTER — Encounter: Payer: Self-pay | Admitting: Internal Medicine

## 2018-07-18 ENCOUNTER — Other Ambulatory Visit: Payer: Self-pay

## 2018-07-18 ENCOUNTER — Inpatient Hospital Stay (HOSPITAL_BASED_OUTPATIENT_CLINIC_OR_DEPARTMENT_OTHER)
Admission: EM | Admit: 2018-07-18 | Discharge: 2018-07-24 | DRG: 385 | Disposition: A | Discharge status: Home / Self Care | Payer: Self-pay | Attending: Internal Medicine | Admitting: Internal Medicine

## 2018-07-18 DIAGNOSIS — E46 Unspecified protein-calorie malnutrition: Secondary | ICD-10-CM

## 2018-07-18 DIAGNOSIS — G8929 Other chronic pain: Secondary | ICD-10-CM | POA: Diagnosis present

## 2018-07-18 DIAGNOSIS — K5000 Crohn's disease of small intestine without complications: Principal | ICD-10-CM | POA: Diagnosis present

## 2018-07-18 DIAGNOSIS — R7989 Other specified abnormal findings of blood chemistry: Secondary | ICD-10-CM | POA: Diagnosis present

## 2018-07-18 DIAGNOSIS — D638 Anemia in other chronic diseases classified elsewhere: Secondary | ICD-10-CM | POA: Diagnosis present

## 2018-07-18 DIAGNOSIS — R000 Tachycardia, unspecified: Secondary | ICD-10-CM | POA: Diagnosis present

## 2018-07-18 DIAGNOSIS — D75839 Thrombocytosis, unspecified: Secondary | ICD-10-CM

## 2018-07-18 DIAGNOSIS — F329 Major depressive disorder, single episode, unspecified: Secondary | ICD-10-CM | POA: Diagnosis present

## 2018-07-18 DIAGNOSIS — R197 Diarrhea, unspecified: Secondary | ICD-10-CM

## 2018-07-18 DIAGNOSIS — K529 Noninfective gastroenteritis and colitis, unspecified: Secondary | ICD-10-CM | POA: Diagnosis present

## 2018-07-18 DIAGNOSIS — G43A0 Cyclical vomiting, not intractable: Secondary | ICD-10-CM

## 2018-07-18 DIAGNOSIS — E43 Unspecified severe protein-calorie malnutrition: Secondary | ICD-10-CM | POA: Diagnosis present

## 2018-07-18 DIAGNOSIS — R112 Nausea with vomiting, unspecified: Secondary | ICD-10-CM

## 2018-07-18 DIAGNOSIS — E440 Moderate protein-calorie malnutrition: Secondary | ICD-10-CM

## 2018-07-18 DIAGNOSIS — R109 Unspecified abdominal pain: Secondary | ICD-10-CM

## 2018-07-18 DIAGNOSIS — Z681 Body mass index (BMI) 19 or less, adult: Secondary | ICD-10-CM

## 2018-07-18 DIAGNOSIS — R531 Weakness: Secondary | ICD-10-CM

## 2018-07-18 DIAGNOSIS — E876 Hypokalemia: Secondary | ICD-10-CM

## 2018-07-18 DIAGNOSIS — E860 Dehydration: Secondary | ICD-10-CM | POA: Diagnosis present

## 2018-07-18 DIAGNOSIS — R627 Adult failure to thrive: Secondary | ICD-10-CM | POA: Diagnosis present

## 2018-07-18 DIAGNOSIS — D649 Anemia, unspecified: Secondary | ICD-10-CM

## 2018-07-18 DIAGNOSIS — D473 Essential (hemorrhagic) thrombocythemia: Secondary | ICD-10-CM

## 2018-07-18 LAB — CBC
HCT: 28.3 % — ABNORMAL LOW (ref 36.0–46.0)
Hemoglobin: 9.4 g/dL — ABNORMAL LOW (ref 12.0–15.0)
MCH: 28.7 pg (ref 26.0–34.0)
MCHC: 33.2 g/dL (ref 30.0–36.0)
MCV: 86.3 fL (ref 78.0–100.0)
Platelets: 514 10*3/uL — ABNORMAL HIGH (ref 150–400)
RBC: 3.28 MIL/uL — ABNORMAL LOW (ref 3.87–5.11)
RDW: 17.1 % — ABNORMAL HIGH (ref 11.5–15.5)
WBC: 5.3 10*3/uL (ref 4.0–10.5)

## 2018-07-18 LAB — CBC WITH DIFFERENTIAL/PLATELET
Basophils Absolute: 0 10*3/uL (ref 0.0–0.1)
Basophils Relative: 0 %
Eosinophils Absolute: 0 10*3/uL (ref 0.0–0.7)
Eosinophils Relative: 0 %
HCT: 29.4 % — ABNORMAL LOW (ref 36.0–46.0)
Hemoglobin: 9.7 g/dL — ABNORMAL LOW (ref 12.0–15.0)
Lymphocytes Relative: 54 %
Lymphs Abs: 4 10*3/uL (ref 0.7–4.0)
MCH: 28.1 pg (ref 26.0–34.0)
MCHC: 33 g/dL (ref 30.0–36.0)
MCV: 85.2 fL (ref 78.0–100.0)
Monocytes Absolute: 1 10*3/uL (ref 0.1–1.0)
Monocytes Relative: 14 %
Neutro Abs: 2.3 10*3/uL (ref 1.7–7.7)
Neutrophils Relative %: 32 %
Platelets: 539 10*3/uL — ABNORMAL HIGH (ref 150–400)
RBC: 3.45 MIL/uL — ABNORMAL LOW (ref 3.87–5.11)
RDW: 17.1 % — ABNORMAL HIGH (ref 11.5–15.5)
WBC: 7.3 10*3/uL (ref 4.0–10.5)

## 2018-07-18 LAB — COMPREHENSIVE METABOLIC PANEL
ALT: 14 U/L (ref 0–44)
AST: 30 U/L (ref 15–41)
Albumin: 1.7 g/dL — ABNORMAL LOW (ref 3.5–5.0)
Alkaline Phosphatase: 104 U/L (ref 38–126)
Anion gap: 7 (ref 5–15)
BUN: 7 mg/dL (ref 6–20)
CO2: 27 mmol/L (ref 22–32)
Calcium: 7.8 mg/dL — ABNORMAL LOW (ref 8.9–10.3)
Chloride: 107 mmol/L (ref 98–111)
Creatinine, Ser: 0.45 mg/dL (ref 0.44–1.00)
GFR calc Af Amer: >60 mL/min (ref >60)
GFR calc non Af Amer: >60 mL/min (ref >60)
Glucose, Bld: 69 mg/dL — ABNORMAL LOW (ref 70–99)
Potassium: 4 mmol/L (ref 3.5–5.1)
Sodium: 141 mmol/L (ref 135–145)
Total Bilirubin: 0.5 mg/dL (ref 0.3–1.2)
Total Protein: 5.8 g/dL — ABNORMAL LOW (ref 6.5–8.1)

## 2018-07-18 LAB — URINALYSIS, ROUTINE W REFLEX MICROSCOPIC
Bilirubin Urine: NEGATIVE
Glucose, UA: NEGATIVE mg/dL
Hgb urine dipstick: NEGATIVE
Ketones, ur: NEGATIVE mg/dL
Leukocytes, UA: NEGATIVE
Nitrite: NEGATIVE
Protein, ur: NEGATIVE mg/dL
Specific Gravity, Urine: <1.005 — ABNORMAL LOW (ref 1.005–1.030)
pH: 6.5 (ref 5.0–8.0)

## 2018-07-18 LAB — RAPID URINE DRUG SCREEN, HOSP PERFORMED
Amphetamines: NOT DETECTED
Barbiturates: NOT DETECTED
Benzodiazepines: NOT DETECTED
Cocaine: NOT DETECTED
Opiates: NOT DETECTED
Tetrahydrocannabinol: NOT DETECTED

## 2018-07-18 LAB — SEDIMENTATION RATE: Sed Rate: 30 mm/hr — ABNORMAL HIGH (ref 0–22)

## 2018-07-18 LAB — CREATININE, SERUM
Creatinine, Ser: 0.51 mg/dL (ref 0.44–1.00)
GFR calc Af Amer: >60 mL/min (ref >60)
GFR calc non Af Amer: >60 mL/min (ref >60)

## 2018-07-18 LAB — PREGNANCY, URINE: Preg Test, Ur: NEGATIVE

## 2018-07-18 LAB — C-REACTIVE PROTEIN: CRP: 1.8 mg/dL — ABNORMAL HIGH (ref <1.0)

## 2018-07-18 MED ORDER — MORPHINE SULFATE (PF) 4 MG/ML IV SOLN: 4.0000 mg | INTRAVENOUS | Status: DC | PRN

## 2018-07-18 MED ORDER — LORAZEPAM 2 MG/ML IJ SOLN
0.5000 mg | Freq: Once | INTRAMUSCULAR | Status: AC
  Administered 2018-07-18: 0.5 mg via INTRAMUSCULAR
  Filled 2018-07-18: qty 1

## 2018-07-18 MED ORDER — ONDANSETRON HCL 4 MG/2ML IJ SOLN
4.0000 mg | Freq: Four times a day (QID) | INTRAMUSCULAR | Status: DC | PRN
  Administered 2018-07-20: 4 mg via INTRAVENOUS

## 2018-07-18 MED ORDER — ACETAMINOPHEN 650 MG RE SUPP: 650.0000 mg | Freq: Four times a day (QID) | RECTAL | Status: DC | PRN

## 2018-07-18 MED ORDER — KETOROLAC TROMETHAMINE 15 MG/ML IJ SOLN: 15.0000 mg | Freq: Four times a day (QID) | INTRAMUSCULAR | Status: AC | PRN

## 2018-07-18 MED ORDER — ACETAMINOPHEN 325 MG PO TABS
650.0000 mg | ORAL_TABLET | Freq: Four times a day (QID) | ORAL | Status: DC | PRN
  Administered 2018-07-22: 650 mg via ORAL
  Filled 2018-07-18: qty 2

## 2018-07-18 MED ORDER — ONDANSETRON HCL 4 MG PO TABS: 4.0000 mg | ORAL_TABLET | Freq: Four times a day (QID) | ORAL | Status: DC | PRN

## 2018-07-18 MED ORDER — KCL IN DEXTROSE-NACL 20-5-0.9 MEQ/L-%-% IV SOLN
INTRAVENOUS | Status: DC
  Filled 2018-07-18: qty 1000

## 2018-07-18 MED ORDER — THIAMINE HCL 100 MG/ML IJ SOLN
Freq: Once | INTRAVENOUS | Status: AC
  Administered 2018-07-18: via INTRAVENOUS
  Filled 2018-07-18: qty 1000

## 2018-07-18 MED ORDER — SODIUM CHLORIDE 0.9 % IV BOLUS
1000.0000 mL | Freq: Once | INTRAVENOUS | Status: AC
  Administered 2018-07-18: 1000 mL via INTRAVENOUS

## 2018-07-18 MED ORDER — MORPHINE SULFATE (PF) 4 MG/ML IV SOLN
4.0000 mg | Freq: Once | INTRAVENOUS | Status: AC
  Administered 2018-07-18: 4 mg via INTRAMUSCULAR
  Filled 2018-07-18: qty 1

## 2018-07-18 MED ORDER — ENOXAPARIN SODIUM 40 MG/0.4ML ~~LOC~~ SOLN
40.0000 mg | Freq: Every day | SUBCUTANEOUS | Status: AC
  Administered 2018-07-18 – 2018-07-19 (×2): 40 mg via SUBCUTANEOUS
  Filled 2018-07-18 (×2): qty 0.4

## 2018-07-18 NOTE — ED Notes (Signed)
[place] IV attempted x 2 by Ati, RN, IV attempted x 2 by Crystal, RT without success. EDP made aware.

## 2018-07-18 NOTE — ED Notes (Signed)
Pt c/o abd pain for "awhile", worse in the morning. Has an appt with GI this week. Also c/o "bone pain" which is also worse in the morning.

## 2018-07-18 NOTE — ED Provider Notes (Signed)
[facility] EMERGENCY DEPARTMENT Provider Note   CSN: [id number] Arrival date & time: 07/18/18  1148   History   Chief Complaint Chief Complaint  Patient presents with  . Generalized Body Aches    HPI Jennifer Cain is a 22 y.o. female who presents with generalized pain and unintentional weight loss. Mother and aunt are at bedside. She states that today she is here because of bilateral lower rib pain, low back pain, and bilateral thigh pain. It feels like a deep "bone pain". She has had ~80 pounds of weight loss since the beginning of the year when her symptoms started. She is unsure of how it began but has had multiple work ups for this. She was seen originally in March at [facility] for nausea, vomiting, diarrhea and chronic abdominal pain. She had a RUQ US done which was negative and was given prescriptions for Phenergan and Protonix. These medications did not significantly help. The pain in the epigastric areas comes and goes. She was seen again in April and had a CT of the abdomen/pelvis which showed inflammation of the bowel and ground glass nodularities in the bilateral lower lobes. She was given bentyl and steroids which she cannot recall taking. She was seen once again in the ED in May and referred to GI again. She established care with Dr. Wynetta Emery and has seen her in July and August. She had an elevated transglutaminase level with a normal Antigliadin Abs, IgA. She has become amenorrheic due to the weight loss. She does not smoke. She cannot identify certain foods that make her symptoms worse. She also recently started having headaches as well. She has an appointment with GI next week (9/3) however her mother had so much difficulty getting her up today and she was crying in pain that they decided to bring her here. She states she has a good appetite but when she eats she either vomits or has diarrhea.  HPI  History reviewed. No pertinent past medical history.  There are no active  problems to display for this patient.   History reviewed. No pertinent surgical history.   OB History   None      Home Medications    Prior to Admission medications   Medication Sig Start Date End Date Taking? Authorizing Provider  dicyclomine (BENTYL) 20 MG tablet Take 1 tablet (20 mg total) by mouth 2 (two) times daily. Patient not taking: Reported on 06/17/2018 03/16/18   Carmin Muskrat, MD  medroxyPROGESTERone (PROVERA) 10 MG tablet Take 1 tablet (10 mg total) by mouth daily. 06/17/18   Ladell Pier, MD  ondansetron (ZOFRAN ODT) 4 MG disintegrating tablet Take 1 tablet (4 mg total) by mouth every 8 (eight) hours as needed for nausea or vomiting. Patient not taking: Reported on 06/17/2018 04/12/18   Carlisle Cater, PA-C  pantoprazole (PROTONIX) 20 MG tablet Take 1 tablet (20 mg total) by mouth 2 (two) times daily. Patient not taking: Reported on 06/17/2018 03/16/18   Carmin Muskrat, MD  predniSONE (DELTASONE) 20 MG tablet Take 2 tablets (40 mg total) by mouth daily with breakfast. For the next four days Patient not taking: Reported on 06/17/2018 03/16/18   Carmin Muskrat, MD    Family History No family history on file.  Social History Social History   Tobacco Use  . Smoking status: Never Smoker  . Smokeless tobacco: Never Used  Substance Use Topics  . Alcohol use: Never    Frequency: Never  . Drug use: Never  Allergies   Patient has no known allergies.   Review of Systems Review of Systems  Constitutional: Positive for activity change, appetite change and unexpected weight change. Negative for chills, diaphoresis and fever.  Respiratory: Negative for cough, shortness of breath and wheezing.   Cardiovascular: Positive for chest pain (bilateral rib pain). Negative for palpitations and leg swelling.  Gastrointestinal: Positive for abdominal pain, diarrhea, nausea and vomiting. Negative for blood in stool and constipation.  Genitourinary: Positive for  menstrual problem. Negative for difficulty urinating and dysuria.  Musculoskeletal: Positive for arthralgias, back pain and myalgias. Negative for gait problem and joint swelling.  Skin: Positive for color change (bruising). Negative for wound.  Neurological: Positive for weakness and headaches. Negative for syncope.  All other systems reviewed and are negative.    Physical Exam Updated Vital Signs BP 127/86 (BP Location: Right Arm)   Pulse (!) 111   Temp 98.4 F (36.9 C) (Oral)   Resp 16   Ht 5\' 3"  (1.6 m)   Wt 45.1 kg   SpO2 100%   BMI 17.60 kg/m   Physical Exam  Constitutional: She is oriented to person, place, and time. She appears well-developed and well-nourished. She appears cachectic. She has a sickly appearance. No distress.  Calm, cooperative, reserved  HENT:  Head: Normocephalic and atraumatic.  Mouth/Throat: Mucous membranes are dry.  Eyes: Pupils are equal, round, and reactive to light. Conjunctivae are normal. Right eye exhibits no discharge. Left eye exhibits no discharge. No scleral icterus.  Neck: Normal range of motion.  Cardiovascular: Tachycardia present. Exam reveals no gallop and no friction rub.  No murmur heard. Pulmonary/Chest: Effort normal and breath sounds normal. No respiratory distress. She exhibits tenderness (bilateral lower rib tenderness).  Abdominal: Soft. Bowel sounds are normal. She exhibits no distension and no mass. There is no tenderness. There is no rebound and no guarding.  Musculoskeletal:  No midline spinal tenderness. Some tenderness over the sacral region  Neurological: She is alert and oriented to person, place, and time.  Skin: Skin is warm and dry.  Psychiatric: Her speech is normal. Her mood appears anxious. She is withdrawn. She exhibits a depressed mood.  Nursing note and vitals reviewed.    ED Treatments / Results  Labs (all labs ordered are listed, but only abnormal results are displayed) Labs Reviewed  COMPREHENSIVE  METABOLIC PANEL - Abnormal; Notable for the following components:      Result Value   Glucose, Bld 69 (*)    Calcium 7.8 (*)    Total Protein 5.8 (*)    Albumin 1.7 (*)    All other components within normal limits  CBC WITH DIFFERENTIAL/PLATELET - Abnormal; Notable for the following components:   RBC 3.45 (*)    Hemoglobin 9.7 (*)    HCT 29.4 (*)    RDW 17.1 (*)    Platelets 539 (*)    All other components within normal limits  URINALYSIS, ROUTINE W REFLEX MICROSCOPIC - Abnormal; Notable for the following components:   Specific Gravity, Urine <1.005 (*)    All other components within normal limits  PREGNANCY, URINE  RAPID URINE DRUG SCREEN, HOSP PERFORMED  SEDIMENTATION RATE  C-REACTIVE PROTEIN    EKG None  Radiology Ct Abdomen Pelvis Wo Contrast  Result Date: 07/18/2018 CLINICAL DATA:  Initial evaluation for unintended weight loss, generalized abdominal pain. EXAM: CT CHEST, ABDOMEN AND PELVIS WITHOUT CONTRAST TECHNIQUE: Multidetector CT imaging of the chest, abdomen and pelvis was performed following the standard protocol without  IV contrast. COMPARISON:  Prior CT from 03/16/2018. FINDINGS: CT CHEST FINDINGS Cardiovascular: Limited noncontrast evaluation of the intrathoracic aorta are grossly unremarkable. No appreciable aneurysm. Partially visualized great vessels grossly within normal limits. Heart size normal. Decreased density within the cardiac blood pool, suggesting anemia. No pericardial effusion. Mediastinum/Nodes: Visualized thyroid normal. No enlarged mediastinal, hilar, or axillary lymph nodes identified on this noncontrast examination. No mediastinal mass. Limited noncontrast evaluation of the esophagus grossly unremarkable. Lungs/Pleura: Tracheobronchial tree widely patent and intact. Lungs well inflated bilaterally. Lungs are clear without focal infiltrate, pulmonary edema, or pleural effusion. No pneumothorax. No worrisome pulmonary nodule or mass. Musculoskeletal:  External soft tissues demonstrate no acute finding. No acute osseous abnormality. No worrisome lytic or blastic osseous lesions. Mild S-shaped thoracolumbar scoliosis noted. CT ABDOMEN PELVIS FINDINGS Hepatobiliary: Mild diffuse hypoattenuation liver suggestive of steatosis. Limited noncontrast evaluation of the liver otherwise unremarkable. Gallbladder within normal limits. No biliary dilatation. Pancreas: Pancreas within normal limits. Spleen: Spleen within normal limits. Adrenals/Urinary Tract: Adrenal glands grossly unremarkable. Kidneys equal in size without evidence for nephrolithiasis or hydronephrosis. No appreciable hydroureter. Bladder moderately distended without acute abnormality. Stomach/Bowel: Stomach within normal limits. No evidence for bowel obstruction. Abnormal wall thickening seen about multiple loops of distal bowel, likely ileum, clustered within the mid and right lower abdomen. Findings consistent with an acute enteritis, which could be either infectious or inflammatory in nature. Finding is similar and may be slightly more pronounced as compared to prior CT. Appendix within normal limits. No other acute inflammatory changes about the bowels.: Grossly within normal limits. Vascular/Lymphatic: Intra-abdominal aorta of normal caliber. Again seen are multiple enlarged mesenteric lymph nodes, largest of which measures approximately 16 mm (series 2, image 80). Findings are indeterminate, but could be reactive. No other appreciable adenopathy on this noncontrast examination. Reproductive: Uterus and ovaries within normal limits. Other: No free air or fluid. Musculoskeletal: No acute osseous abnormality. No worrisome lytic or blastic osseous lesions. IMPRESSION: 1. Abnormal circumferential wall thickening about multiple loops of distal small bowel within the mid and lower abdomen, compatible with acute enteritis. Findings are similar relative to prior CT from 03/16/2018, and again could be either  infectious or inflammatory in nature. Findings are relatively similar as compared to prior CT from 03/16/2018. 2. Enlarged mesenteric adenopathy, likely reactive. Findings also similar to previous. 3. Hepatic steatosis. Electronically Signed   By: Jeannine Boga M.D.   On: 07/18/2018 16:36   Ct Head Wo Contrast  Result Date: 07/18/2018 CLINICAL DATA:  Abdominal pain, unintended weight loss. EXAM: CT HEAD WITHOUT CONTRAST TECHNIQUE: Contiguous axial images were obtained from the base of the skull through the vertex without intravenous contrast. COMPARISON:  None. FINDINGS: BRAIN: No intraparenchymal hemorrhage, mass effect nor midline shift. Mild parenchymal brain volume loss. No hydrocephalus. No acute large vascular territory infarcts. No abnormal extra-axial fluid collections. Basal cisterns are patent. Empty sella. VASCULAR: Unremarkable. SKULL/SOFT TISSUES: No skull fracture. Persistent metopic suture. No significant soft tissue swelling. ORBITS/SINUSES: The included ocular globes and orbital contents are normal.Trace paranasal sinus mucosal thickening. Mastoid air cells are well aerated. OTHER: None. IMPRESSION: 1. No acute intracranial process. 2. Mild parenchymal brain volume loss. 3. Empty sella. Electronically Signed   By: Elon Alas M.D.   On: 07/18/2018 16:08   Ct Chest Wo Contrast  Result Date: 07/18/2018 CLINICAL DATA:  Initial evaluation for unintended weight loss, generalized abdominal pain. EXAM: CT CHEST, ABDOMEN AND PELVIS WITHOUT CONTRAST TECHNIQUE: Multidetector CT imaging of the chest, abdomen and pelvis  was performed following the standard protocol without IV contrast. COMPARISON:  Prior CT from 03/16/2018. FINDINGS: CT CHEST FINDINGS Cardiovascular: Limited noncontrast evaluation of the intrathoracic aorta are grossly unremarkable. No appreciable aneurysm. Partially visualized great vessels grossly within normal limits. Heart size normal. Decreased density within the  cardiac blood pool, suggesting anemia. No pericardial effusion. Mediastinum/Nodes: Visualized thyroid normal. No enlarged mediastinal, hilar, or axillary lymph nodes identified on this noncontrast examination. No mediastinal mass. Limited noncontrast evaluation of the esophagus grossly unremarkable. Lungs/Pleura: Tracheobronchial tree widely patent and intact. Lungs well inflated bilaterally. Lungs are clear without focal infiltrate, pulmonary edema, or pleural effusion. No pneumothorax. No worrisome pulmonary nodule or mass. Musculoskeletal: External soft tissues demonstrate no acute finding. No acute osseous abnormality. No worrisome lytic or blastic osseous lesions. Mild S-shaped thoracolumbar scoliosis noted. CT ABDOMEN PELVIS FINDINGS Hepatobiliary: Mild diffuse hypoattenuation liver suggestive of steatosis. Limited noncontrast evaluation of the liver otherwise unremarkable. Gallbladder within normal limits. No biliary dilatation. Pancreas: Pancreas within normal limits. Spleen: Spleen within normal limits. Adrenals/Urinary Tract: Adrenal glands grossly unremarkable. Kidneys equal in size without evidence for nephrolithiasis or hydronephrosis. No appreciable hydroureter. Bladder moderately distended without acute abnormality. Stomach/Bowel: Stomach within normal limits. No evidence for bowel obstruction. Abnormal wall thickening seen about multiple loops of distal bowel, likely ileum, clustered within the mid and right lower abdomen. Findings consistent with an acute enteritis, which could be either infectious or inflammatory in nature. Finding is similar and may be slightly more pronounced as compared to prior CT. Appendix within normal limits. No other acute inflammatory changes about the bowels.: Grossly within normal limits. Vascular/Lymphatic: Intra-abdominal aorta of normal caliber. Again seen are multiple enlarged mesenteric lymph nodes, largest of which measures approximately 16 mm (series 2, image 80).  Findings are indeterminate, but could be reactive. No other appreciable adenopathy on this noncontrast examination. Reproductive: Uterus and ovaries within normal limits. Other: No free air or fluid. Musculoskeletal: No acute osseous abnormality. No worrisome lytic or blastic osseous lesions. IMPRESSION: 1. Abnormal circumferential wall thickening about multiple loops of distal small bowel within the mid and lower abdomen, compatible with acute enteritis. Findings are similar relative to prior CT from 03/16/2018, and again could be either infectious or inflammatory in nature. Findings are relatively similar as compared to prior CT from 03/16/2018. 2. Enlarged mesenteric adenopathy, likely reactive. Findings also similar to previous. 3. Hepatic steatosis. Electronically Signed   By: Jeannine Boga M.D.   On: 07/18/2018 16:36   Dg Femur Min 2 Views Left  Result Date: 07/18/2018 CLINICAL DATA:  Bilateral lower extremity bone pain for months without known injury. EXAM: LEFT FEMUR 2 VIEWS COMPARISON:  Same day CT abdomen and pelvis FINDINGS: Overlapping AP and frog-leg views of the left femur. Joint spaces appear intact. Femoral head is spherical in appearance and maintained within the acetabular component. There is no evidence of fracture or other focal bone lesions. Enteric contrast within overlying bowel loops simulate sclerotic lesions of the sacrum. Soft tissues are unremarkable. IMPRESSION: No acute osseous abnormality. Electronically Signed   By: Ashley Royalty M.D.   On: 07/18/2018 16:18   Dg Femur Min 2 Views Right  Result Date: 07/18/2018 CLINICAL DATA:  Bilateral lower extremity bone pain for months. No known injury. EXAM: RIGHT FEMUR 2 VIEWS COMPARISON:  Same day CT AP FINDINGS: Overlapping AP and frog-leg views of the right femur. Intact joint spaces. No fracture or suspicious osseous lesions. Soft tissues are unremarkable. Contrast within bowel loops project over the lower  pelvis and sacrum  accounting for the areas of hazy increased opacity. No sacral lesion is identified on same day CT. IMPRESSION: No acute osseous abnormality. Electronically Signed   By: Ashley Royalty M.D.   On: 07/18/2018 16:15    Procedures Procedures (including critical care time)  Medications Ordered in ED Medications  sodium chloride 0.9 % bolus 1,000 mL (0 mLs Intravenous Stopped 07/18/18 1626)  LORazepam (ATIVAN) injection 0.5 mg (0.5 mg Intramuscular Given 07/18/18 1426)  morphine 4 MG/ML injection 4 mg (4 mg Intramuscular Given 07/18/18 1427)     Initial Impression / Assessment and Plan / ED Course  I have reviewed the triage vital signs and the nursing notes.  Pertinent labs & imaging results that were available during my care of the patient were reviewed by me and considered in my medical decision making (see chart for details).  22 year old previously healthy female with unintentional weight loss, anorexia, multiple areas of pain over the past several months. Differential is broad: GI related, malignancy, infectious, endocrine, rheumatologic, psychiatric. TSH, HIV, blood glucose levels are normal. She is tachycardic but otherwise vitals are normal. She states that she was approximately 180 pounds at the beginning of the year and now is 99. On exam she is cachetic, sickly appearing. She has mild tachycardia with regular rhythm. Lungs are CTA. Abdomen is soft, nontender. She moves all extremities without severe pain. Will obtain labs, UA, and imaging of head, chest, abdomen/pelvis.   2PM Notified by nursing staff that that they have been unable to establish IV access for fluids or imaging. She was able to tolerate oral contrast. Nursing will attempt to look in her foot and give fluids as the patient appears dehydrated/dry. She was able to tolerate oral contrast.  4:35 PM Still awaiting results. Nursing were able to establish a small IV and run fluids. CT head shows mild brain volume loss. Bilateral femur  xrays are negative.  4:51 PM CT chest, abdomen, pelvis show inflammation of the small bowel and fatty liver disease. CBC is remarkable for anemia which is at baseline. CMp is remarkable for mild hypoglycemia (69), hypocalcemia (7.8), low albumin (1.7).   6:16 PM Discussed with Dr. Havery Moros with GI who states he recommends admission for FTT and would pursue work up in the hospital since the patient is not doing well. Discussed with Dr. Posey Pronto who will admit.   Final Clinical Impressions(s) / ED Diagnoses   Final diagnoses:  Failure to thrive in adult  Chronic abdominal pain  Nausea vomiting and diarrhea  Malnutrition, unspecified type [facility])    ED Discharge Orders    None       Recardo Evangelist, PA-C 07/18/18 1819    Davonna Belling, MD 07/19/18 [phone]

## 2018-07-18 NOTE — ED Triage Notes (Signed)
PT presents with c/o "body pain " all over for months but worse today

## 2018-07-18 NOTE — ED Notes (Signed)
IV attempted without success. 

## 2018-07-18 NOTE — H&P (Signed)
History and Physical    Jennifer Cain ZOX:[id number] DOB: 1996-03-01 DOA: 07/18/2018  PCP: Ladell Pier, MD  Patient coming from: [place] have personally briefly reviewed patient's old medical records in [place]  Chief Complaint: weakness  HPI: Jennifer Cain is a 22 y.o. female with medical history significant of many  months of an unintentional weight loss, abdominal pain, nausea and vomiting.  Patient has had numerous ER visits and she has recently been referred to GI for further work-up.  She is Not currently on any medications.  She has been tried on Protonix, Bentyl and a short stent of steroids a few months ago none of which seem to help her symptoms.  The patient was so weak her mother could not get her out of bed which prompted him to bring her into the ED. Per  Report patient had a elevated transglutaminase level with a normal antigliadin IgA antibody.  sHe has a chronic anemia that is stable. She oes admit to depressed mood but denies any suicidal ideation.  States that she is able to eat however when she eats solids it either results in vomiting or diarrhea.  She states she is able to keep down liquids.   blood work was significant for a albumin of 1.7 .  And hemoglobin of 9.7 .  CT scan showed changes suggestive of enteritis but was similar to prior CT done in April.  ED Course: PA consulted GI,  DR Armbruster who recommended admission for FTT.  They  did not recommend any acute treatment at this time. Dr Posey Pronto accepted for admission.  An IV was unable to be obtained so the patient was given IM Ativan and IM morphine and 1 L IV fluid.  Review of Systems: Positive for months of unintentional weight loss, nausea, vomiting, abdominal pain, depressed mood, All others reviewed with patient  and are  negative unless otherwise stated    History reviewed. No pertinent past medical history.  History reviewed. No pertinent surgical history.   reports that she has never  smoked. She has never used smokeless tobacco. She reports that she does not drink alcohol or use drugs.  No Known Allergies  Family History  Problem Relation Age of Onset  . GI Disease Neg Hx   . Autoimmune disease Neg Hx      Prior to Admission medications   Medication Sig Start Date End Date Taking? Authorizing Provider  dicyclomine (BENTYL) 20 MG tablet Take 1 tablet (20 mg total) by mouth 2 (two) times daily. Patient not taking: Reported on 06/17/2018 03/16/18   Carmin Muskrat, MD  medroxyPROGESTERone (PROVERA) 10 MG tablet Take 1 tablet (10 mg total) by mouth daily. Patient not taking: Reported on 07/18/2018 06/17/18   Ladell Pier, MD  ondansetron (ZOFRAN ODT) 4 MG disintegrating tablet Take 1 tablet (4 mg total) by mouth every 8 (eight) hours as needed for nausea or vomiting. Patient not taking: Reported on 06/17/2018 04/12/18   Carlisle Cater, PA-C  pantoprazole (PROTONIX) 20 MG tablet Take 1 tablet (20 mg total) by mouth 2 (two) times daily. Patient not taking: Reported on 06/17/2018 03/16/18   Carmin Muskrat, MD  predniSONE (DELTASONE) 20 MG tablet Take 2 tablets (40 mg total) by mouth daily with breakfast. For the next four days Patient not taking: Reported on 07/18/2018 03/16/18   Carmin Muskrat, MD    Physical Exam: Vitals:   07/18/18 1815 07/18/18 1830 07/18/18 2016 07/18/18 2028  BP:  (!) 121/95  Marland Kitchen)  132/96  Pulse: (!) 110 (!) 101  (!) 105  Resp:    12  Temp:    99.1 F (37.3 C)  TempSrc:    Oral  SpO2: 100% 100%  100%  Weight:   (P) 46.8 kg   Height:   (P) 5\' 3"  (1.6 m)     Constitutional: NAD, calm, comfortable Vitals:   07/18/18 1815 07/18/18 1830 07/18/18 2016 07/18/18 2028  BP:  (!) 121/95  (!) 132/96  Pulse: (!) 110 (!) 101  (!) 105  Resp:    12  Temp:    99.1 F (37.3 C)  TempSrc:    Oral  SpO2: 100% 100%  100%  Weight:   (P) 46.8 kg   Height:   (P) 5\' 3"  (1.6 m)    Eyes: PERRL, lids and conjunctivae normal ENMT: Mucous membranes are moist.  Posterior pharynx clear of any exudate or lesions.Normal dentition.  Neck: normal, supple, no masses, no thyromegaly LAD: no Cervical or axillary lymphadenopathy Respiratory: clear to auscultation bilaterally, no wheezing, no crackles. Normal respiratory effort. No accessory muscle use.  Cardiovascular: Regular rate and rhythm, no murmurs / rubs / gallops. No extremity edema. 2+ pedal pulses. Abdomen: Mild generalized tenderness, no masses palpated. No hepatosplenomegaly. Bowel sounds positive.  Musculoskeletal: no clubbing / cyanosis. No joint deformity upper and lower extremities. Good ROM, no contractures.  Positive muscle wasting  Skin: no rashes, lesions, ulcers. No induration Neurologic: CN 2-12 grossly intact.  Moves  all extremities equally but generally weak.  Psychiatric: Normal judgment and insight. Alert and oriented x 3. Flat     Labs on Admission: I have personally reviewed following labs and imaging studies  CBC: Recent Labs  Lab 07/18/18 1455  WBC 7.3  NEUTROABS 2.3  HGB 9.7*  HCT 29.4*  MCV 85.2  PLT 413*   Basic Metabolic Panel: Recent Labs  Lab 07/18/18 1455  NA 141  K 4.0  CL 107  CO2 27  GLUCOSE 69*  BUN 7  CREATININE 0.45  CALCIUM 7.8*   GFR: Estimated Creatinine Clearance: 78.5 mL/min (by C-G formula based on SCr of 0.45 mg/dL). Liver Function Tests: Recent Labs  Lab 07/18/18 1455  AST 30  ALT 14  ALKPHOS 104  BILITOT 0.5  PROT 5.8*  ALBUMIN 1.7*   No results for input(s): LIPASE, AMYLASE in the last 168 hours. No results for input(s): AMMONIA in the last 168 hours. Coagulation Profile: No results for input(s): INR, PROTIME in the last 168 hours. Cardiac Enzymes: No results for input(s): CKTOTAL, CKMB, CKMBINDEX, TROPONINI in the last 168 hours. BNP (last 3 results) No results for input(s): PROBNP in the last 8760 hours. HbA1C: No results for input(s): HGBA1C in the last 72 hours. CBG: No results for input(s): GLUCAP in the last 168  hours. Lipid Profile: No results for input(s): CHOL, HDL, LDLCALC, TRIG, CHOLHDL, LDLDIRECT in the last 72 hours. Thyroid Function Tests: No results for input(s): TSH, T4TOTAL, FREET4, T3FREE, THYROIDAB in the last 72 hours. Anemia Panel: No results for input(s): VITAMINB12, FOLATE, FERRITIN, TIBC, IRON, RETICCTPCT in the last 72 hours. Urine analysis:    Component Value Date/Time   COLORURINE YELLOW 07/18/2018 [place] 07/18/2018 1217   LABSPEC <1.005 (L) 07/18/2018 1217   PHURINE 6.5 07/18/2018 [place] 07/18/2018 1217   [place] 07/18/2018 [place] 07/18/2018 1217   KETONESUR NEGATIVE 07/18/2018 1217   PROTEINUR NEGATIVE 07/18/2018 1217   NITRITE NEGATIVE  07/18/2018 [place] 07/18/2018 1217    Radiological Exams on Admission: Ct Abdomen Pelvis Wo Contrast  Result Date: 07/18/2018 CLINICAL DATA:  Initial evaluation for unintended weight loss, generalized abdominal pain. EXAM: CT CHEST, ABDOMEN AND PELVIS WITHOUT CONTRAST TECHNIQUE: Multidetector CT imaging of the chest, abdomen and pelvis was performed following the standard protocol without IV contrast. COMPARISON:  Prior CT from 03/16/2018. FINDINGS: CT CHEST FINDINGS Cardiovascular: Limited noncontrast evaluation of the intrathoracic aorta are grossly unremarkable. No appreciable aneurysm. Partially visualized great vessels grossly within normal limits. Heart size normal. Decreased density within the cardiac blood pool, suggesting anemia. No pericardial effusion. Mediastinum/Nodes: Visualized thyroid normal. No enlarged mediastinal, hilar, or axillary lymph nodes identified on this noncontrast examination. No mediastinal mass. Limited noncontrast evaluation of the esophagus grossly unremarkable. Lungs/Pleura: Tracheobronchial tree widely patent and intact. Lungs well inflated bilaterally. Lungs are clear without focal infiltrate, pulmonary edema, or pleural  effusion. No pneumothorax. No worrisome pulmonary nodule or mass. Musculoskeletal: External soft tissues demonstrate no acute finding. No acute osseous abnormality. No worrisome lytic or blastic osseous lesions. Mild S-shaped thoracolumbar scoliosis noted. CT ABDOMEN PELVIS FINDINGS Hepatobiliary: Mild diffuse hypoattenuation liver suggestive of steatosis. Limited noncontrast evaluation of the liver otherwise unremarkable. Gallbladder within normal limits. No biliary dilatation. Pancreas: Pancreas within normal limits. Spleen: Spleen within normal limits. Adrenals/Urinary Tract: Adrenal glands grossly unremarkable. Kidneys equal in size without evidence for nephrolithiasis or hydronephrosis. No appreciable hydroureter. Bladder moderately distended without acute abnormality. Stomach/Bowel: Stomach within normal limits. No evidence for bowel obstruction. Abnormal wall thickening seen about multiple loops of distal bowel, likely ileum, clustered within the mid and right lower abdomen. Findings consistent with an acute enteritis, which could be either infectious or inflammatory in nature. Finding is similar and may be slightly more pronounced as compared to prior CT. Appendix within normal limits. No other acute inflammatory changes about the bowels.: Grossly within normal limits. Vascular/Lymphatic: Intra-abdominal aorta of normal caliber. Again seen are multiple enlarged mesenteric lymph nodes, largest of which measures approximately 16 mm (series 2, image 80). Findings are indeterminate, but could be reactive. No other appreciable adenopathy on this noncontrast examination. Reproductive: Uterus and ovaries within normal limits. Other: No free air or fluid. Musculoskeletal: No acute osseous abnormality. No worrisome lytic or blastic osseous lesions. IMPRESSION: 1. Abnormal circumferential wall thickening about multiple loops of distal small bowel within the mid and lower abdomen, compatible with acute enteritis.  Findings are similar relative to prior CT from 03/16/2018, and again could be either infectious or inflammatory in nature. Findings are relatively similar as compared to prior CT from 03/16/2018. 2. Enlarged mesenteric adenopathy, likely reactive. Findings also similar to previous. 3. Hepatic steatosis. Electronically Signed   By: Jeannine Boga M.D.   On: 07/18/2018 16:36   Ct Head Wo Contrast  Result Date: 07/18/2018 CLINICAL DATA:  Abdominal pain, unintended weight loss. EXAM: CT HEAD WITHOUT CONTRAST TECHNIQUE: Contiguous axial images were obtained from the base of the skull through the vertex without intravenous contrast. COMPARISON:  None. FINDINGS: BRAIN: No intraparenchymal hemorrhage, mass effect nor midline shift. Mild parenchymal brain volume loss. No hydrocephalus. No acute large vascular territory infarcts. No abnormal extra-axial fluid collections. Basal cisterns are patent. Empty sella. VASCULAR: Unremarkable. SKULL/SOFT TISSUES: No skull fracture. Persistent metopic suture. No significant soft tissue swelling. ORBITS/SINUSES: The included ocular globes and orbital contents are normal.Trace paranasal sinus mucosal thickening. Mastoid air cells are well aerated. OTHER: None. IMPRESSION: 1. No acute intracranial process. 2. Mild  parenchymal brain volume loss. 3. Empty sella. Electronically Signed   By: Elon Alas M.D.   On: 07/18/2018 16:08   Ct Chest Wo Contrast  Result Date: 07/18/2018 CLINICAL DATA:  Initial evaluation for unintended weight loss, generalized abdominal pain. EXAM: CT CHEST, ABDOMEN AND PELVIS WITHOUT CONTRAST TECHNIQUE: Multidetector CT imaging of the chest, abdomen and pelvis was performed following the standard protocol without IV contrast. COMPARISON:  Prior CT from 03/16/2018. FINDINGS: CT CHEST FINDINGS Cardiovascular: Limited noncontrast evaluation of the intrathoracic aorta are grossly unremarkable. No appreciable aneurysm. Partially visualized great  vessels grossly within normal limits. Heart size normal. Decreased density within the cardiac blood pool, suggesting anemia. No pericardial effusion. Mediastinum/Nodes: Visualized thyroid normal. No enlarged mediastinal, hilar, or axillary lymph nodes identified on this noncontrast examination. No mediastinal mass. Limited noncontrast evaluation of the esophagus grossly unremarkable. Lungs/Pleura: Tracheobronchial tree widely patent and intact. Lungs well inflated bilaterally. Lungs are clear without focal infiltrate, pulmonary edema, or pleural effusion. No pneumothorax. No worrisome pulmonary nodule or mass. Musculoskeletal: External soft tissues demonstrate no acute finding. No acute osseous abnormality. No worrisome lytic or blastic osseous lesions. Mild S-shaped thoracolumbar scoliosis noted. CT ABDOMEN PELVIS FINDINGS Hepatobiliary: Mild diffuse hypoattenuation liver suggestive of steatosis. Limited noncontrast evaluation of the liver otherwise unremarkable. Gallbladder within normal limits. No biliary dilatation. Pancreas: Pancreas within normal limits. Spleen: Spleen within normal limits. Adrenals/Urinary Tract: Adrenal glands grossly unremarkable. Kidneys equal in size without evidence for nephrolithiasis or hydronephrosis. No appreciable hydroureter. Bladder moderately distended without acute abnormality. Stomach/Bowel: Stomach within normal limits. No evidence for bowel obstruction. Abnormal wall thickening seen about multiple loops of distal bowel, likely ileum, clustered within the mid and right lower abdomen. Findings consistent with an acute enteritis, which could be either infectious or inflammatory in nature. Finding is similar and may be slightly more pronounced as compared to prior CT. Appendix within normal limits. No other acute inflammatory changes about the bowels.: Grossly within normal limits. Vascular/Lymphatic: Intra-abdominal aorta of normal caliber. Again seen are multiple enlarged  mesenteric lymph nodes, largest of which measures approximately 16 mm (series 2, image 80). Findings are indeterminate, but could be reactive. No other appreciable adenopathy on this noncontrast examination. Reproductive: Uterus and ovaries within normal limits. Other: No free air or fluid. Musculoskeletal: No acute osseous abnormality. No worrisome lytic or blastic osseous lesions. IMPRESSION: 1. Abnormal circumferential wall thickening about multiple loops of distal small bowel within the mid and lower abdomen, compatible with acute enteritis. Findings are similar relative to prior CT from 03/16/2018, and again could be either infectious or inflammatory in nature. Findings are relatively similar as compared to prior CT from 03/16/2018. 2. Enlarged mesenteric adenopathy, likely reactive. Findings also similar to previous. 3. Hepatic steatosis. Electronically Signed   By: Jeannine Boga M.D.   On: 07/18/2018 16:36   Dg Femur Min 2 Views Left  Result Date: 07/18/2018 CLINICAL DATA:  Bilateral lower extremity bone pain for months without known injury. EXAM: LEFT FEMUR 2 VIEWS COMPARISON:  Same day CT abdomen and pelvis FINDINGS: Overlapping AP and frog-leg views of the left femur. Joint spaces appear intact. Femoral head is spherical in appearance and maintained within the acetabular component. There is no evidence of fracture or other focal bone lesions. Enteric contrast within overlying bowel loops simulate sclerotic lesions of the sacrum. Soft tissues are unremarkable. IMPRESSION: No acute osseous abnormality. Electronically Signed   By: Ashley Royalty M.D.   On: 07/18/2018 16:18   Dg Femur Min 2  Views Right  Result Date: 07/18/2018 CLINICAL DATA:  Bilateral lower extremity bone pain for months. No known injury. EXAM: RIGHT FEMUR 2 VIEWS COMPARISON:  Same day CT AP FINDINGS: Overlapping AP and frog-leg views of the right femur. Intact joint spaces. No fracture or suspicious osseous lesions. Soft tissues  are unremarkable. Contrast within bowel loops project over the lower pelvis and sacrum accounting for the areas of hazy increased opacity. No sacral lesion is identified on same day CT. IMPRESSION: No acute osseous abnormality. Electronically Signed   By: Ashley Royalty M.D.   On: 07/18/2018 16:15      Assessment/Plan Principal Problem:   FTT (failure to thrive) in adult Active Problems:   Enteritis-Chronic   Protein calorie malnutrition (HCC)   Chronic diarrhea   Nausea & vomiting-chronic    -Admit for supportive care, will give banana bag, consult GI as above ,await for any further recommendations.  Full liquid diet as tolerated.  Suspect protein-losing enteropathy or small bowel disease     DVT prophylaxis: Lovenox  Code Status: Full  Disposition Plan: Discharge home 2 days  Consults called: GI DR Armbruster Admission status:  Obs  medical   Kacper Cartlidge Johnson-Pitts MD [facility] Pager [phone]  If 7PM-7AM, please contact night-coverage [email or web address] Password [email or web address]  07/18/2018, 10:37 PM

## 2018-07-19 ENCOUNTER — Inpatient Hospital Stay: Payer: Self-pay

## 2018-07-19 DIAGNOSIS — E440 Moderate protein-calorie malnutrition: Secondary | ICD-10-CM

## 2018-07-19 DIAGNOSIS — R634 Abnormal weight loss: Secondary | ICD-10-CM

## 2018-07-19 DIAGNOSIS — R1084 Generalized abdominal pain: Secondary | ICD-10-CM

## 2018-07-19 DIAGNOSIS — E876 Hypokalemia: Secondary | ICD-10-CM

## 2018-07-19 DIAGNOSIS — D649 Anemia, unspecified: Secondary | ICD-10-CM

## 2018-07-19 DIAGNOSIS — D75839 Thrombocytosis, unspecified: Secondary | ICD-10-CM

## 2018-07-19 DIAGNOSIS — R531 Weakness: Secondary | ICD-10-CM

## 2018-07-19 DIAGNOSIS — D473 Essential (hemorrhagic) thrombocythemia: Secondary | ICD-10-CM

## 2018-07-19 DIAGNOSIS — R933 Abnormal findings on diagnostic imaging of other parts of digestive tract: Secondary | ICD-10-CM

## 2018-07-19 LAB — CBC WITH DIFFERENTIAL/PLATELET
Basophils Absolute: 0 10*3/uL (ref 0.0–0.1)
Basophils Relative: 0 %
Eosinophils Absolute: 0 10*3/uL (ref 0.0–0.7)
Eosinophils Relative: 0 %
HCT: 30.3 % — ABNORMAL LOW (ref 36.0–46.0)
Hemoglobin: 9.7 g/dL — ABNORMAL LOW (ref 12.0–15.0)
Lymphocytes Relative: 53 %
Lymphs Abs: 2.8 10*3/uL (ref 0.7–4.0)
MCH: 27.8 pg (ref 26.0–34.0)
MCHC: 32 g/dL (ref 30.0–36.0)
MCV: 86.8 fL (ref 78.0–100.0)
Monocytes Absolute: 0.7 10*3/uL (ref 0.1–1.0)
Monocytes Relative: 13 %
Neutro Abs: 1.8 10*3/uL (ref 1.7–7.7)
Neutrophils Relative %: 34 %
Platelets: 571 10*3/uL — ABNORMAL HIGH (ref 150–400)
RBC: 3.49 MIL/uL — ABNORMAL LOW (ref 3.87–5.11)
RDW: 17.2 % — ABNORMAL HIGH (ref 11.5–15.5)
WBC: 5.3 10*3/uL (ref 4.0–10.5)

## 2018-07-19 LAB — HEMOGLOBIN A1C
Hgb A1c MFr Bld: 4 % — ABNORMAL LOW (ref 4.8–5.6)
Mean Plasma Glucose: 68.1 mg/dL

## 2018-07-19 LAB — COMPREHENSIVE METABOLIC PANEL
ALT: 15 U/L (ref 0–44)
AST: 26 U/L (ref 15–41)
Albumin: 1.8 g/dL — ABNORMAL LOW (ref 3.5–5.0)
Alkaline Phosphatase: 115 U/L (ref 38–126)
Anion gap: 11 (ref 5–15)
BUN: 5 mg/dL — ABNORMAL LOW (ref 6–20)
CO2: 23 mmol/L (ref 22–32)
Calcium: 8.1 mg/dL — ABNORMAL LOW (ref 8.9–10.3)
Chloride: 104 mmol/L (ref 98–111)
Creatinine, Ser: 0.38 mg/dL — ABNORMAL LOW (ref 0.44–1.00)
GFR calc Af Amer: >60 mL/min (ref >60)
GFR calc non Af Amer: >60 mL/min (ref >60)
Glucose, Bld: 66 mg/dL — ABNORMAL LOW (ref 70–99)
Potassium: 3.2 mmol/L — ABNORMAL LOW (ref 3.5–5.1)
Sodium: 138 mmol/L (ref 135–145)
Total Bilirubin: 0.3 mg/dL (ref 0.3–1.2)
Total Protein: 6 g/dL — ABNORMAL LOW (ref 6.5–8.1)

## 2018-07-19 LAB — PHOSPHORUS: Phosphorus: 2.8 mg/dL (ref 2.5–4.6)

## 2018-07-19 LAB — PREALBUMIN: Prealbumin: <5 mg/dL — ABNORMAL LOW (ref 18–38)

## 2018-07-19 LAB — MAGNESIUM: Magnesium: 1.8 mg/dL (ref 1.7–2.4)

## 2018-07-19 MED ORDER — POTASSIUM CHLORIDE CRYS ER 20 MEQ PO TBCR
40.0000 meq | EXTENDED_RELEASE_TABLET | Freq: Once | ORAL | Status: AC
  Administered 2018-07-19: 40 meq via ORAL
  Filled 2018-07-19: qty 2

## 2018-07-19 MED ORDER — PEG-KCL-NACL-NASULF-NA ASC-C 100 G PO SOLR
0.5000 | Freq: Once | ORAL | Status: AC
  Administered 2018-07-20: 100 g via ORAL

## 2018-07-19 MED ORDER — POTASSIUM CHLORIDE 10 MEQ/100ML IV SOLN
10.0000 meq | INTRAVENOUS | Status: AC
  Administered 2018-07-19 (×4): 10 meq via INTRAVENOUS
  Filled 2018-07-19 (×3): qty 100

## 2018-07-19 MED ORDER — POTASSIUM CHLORIDE 10 MEQ/100ML IV SOLN
10.0000 meq | INTRAVENOUS | Status: AC
  Filled 2018-07-19: qty 100

## 2018-07-19 MED ORDER — THIAMINE HCL 100 MG/ML IJ SOLN
INTRAVENOUS | Status: DC
  Administered 2018-07-19 – 2018-07-20 (×3): via INTRAVENOUS
  Filled 2018-07-19 (×5): qty 1000

## 2018-07-19 MED ORDER — PEG-KCL-NACL-NASULF-NA ASC-C 100 G PO SOLR: 1.0000 | Freq: Once | ORAL | Status: DC

## 2018-07-19 MED ORDER — PEG-KCL-NACL-NASULF-NA ASC-C 100 G PO SOLR
0.5000 | Freq: Once | ORAL | Status: AC
  Administered 2018-07-19: 100 g via ORAL
  Filled 2018-07-19: qty 1

## 2018-07-19 NOTE — H&P (View-Only) (Signed)
 Referring Provider: [facility]  Primary Care Physician:  Jennifer Cain, Deborah B, MD Primary Gastroenterologist:  Has a new patient appt with Averlee Swartz scheduled for 07/23/18.   Reason for Consultation:   Nausea, vomiting, diarrhea, abdominal pain, weight loss and abnormal CT scan.     ASSESSMENT AND PLAN:    1. 22 yo female with several month history of abdominal pain , N / V / non-bloody associated with unintentional (documented) weight loss of 27 pound weight loss since late April. Symptoms in setting of CT scan in April and this admission revealing abnormal thickening of several distal small bowel loops with mesenteric adenopathy. Certainly need to exclude small bowel Crohn's disease in this patient.  -patient will be scheduled for EGD / colonoscopy with probably biopsies. The risks and benefits of EGD / colonoscopy were discussed and the patient agrees to proceed.  -check pre-albumin. She is already getting a banana bag.  -I stopped DVT prophylaxis lovenox after tonight's dose. Will need to address whether to resume tomorrow depending on endoscopic findings and intervention.    2. Jennifer Cain anemia. Hgb stable ~ 9.7. Ferritin > 200 in late April. Even in setting of inflammation this makes IDA seem unlikely  HPI: Jennifer Cain is a 22 y.o. female who has been having fatigue, N/V,non-bloody diarrhea and diffuse abdominal pain for months. She in ED late April for evaluation of sx. At that time she reported a 30 pound weight loss. Albumin was < 2, she was anemic with a hgb of 9.9( down from 12). Normal MCV. Ferritin 212.  Lipase and liver chemistries okay.  CT scan showed circumferential wall thickening of the mid and distal small bowel compatible with enteritis, multiple enlarged small bowel mesenteric lymph nodes may be reactive in etiology. ED prescribed bentyl, PPI, and prednisone. Recommended to see GI. Back to ED late May with same sx. Hgb about the same. WBC was normal. Platelets were high  at 582. She  had not seen GI yet probably because she had no PCP to refer her. Established care with Dr. Johnson late July. ANA, tTg ab just above normal at 7,antigliadin ab IgA 9. She was referred to GI and was to see us on 07/23/18 but admitted in interim after presenting to ED with generalized pain   ED evaluation yesterday:  WBC 5.3 Hgb 9.4, MCV 86 Platelets 514 K+ 4.0 Normal renal function Albumin 1.7, remainder of liver chemistries WNL CRP 1.8 UDS negative  CT abd / pelvis w/o constrast IMPRESSION: 1. Abnormal circumferential wall thickening about multiple loops of distal small bowel within the mid and lower abdomen, compatible with acute enteritis. Findings are similar relative to prior CT from 03/16/2018, and again could be either infectious or inflammatory in nature. Findings are relatively similar as compared to prior CT from 03/16/2018. 2. Enlarged mesenteric adenopathy, likely reactive. Findings also similar to previous. 3. Hepatic steatosis.  Jennifer Cain describes loose, non-bloody stools ~ 3 times a day for months with some nocturnal stooling. She has frequent N/V and describes diffuse abdominal pain, worse after eating. She recalls that the 2 weeks of prednisone prescribed by ED in late April didn't help. She doesn't think any of the other meds helped either. No FMH of IBD. She has tingling in legs when getting up from bed sometimes. No definite joint pains. No skin lesions   History reviewed. No pertinent past medical history.  History reviewed. No pertinent surgical history.  Prior to Admission medications   Medication Sig Start Date End   Date Taking? Authorizing Provider  dicyclomine (BENTYL) 20 MG tablet Take 1 tablet (20 mg total) by mouth 2 (two) times daily. Patient not taking: Reported on 06/17/2018 03/16/18   Lockwood, Robert, MD  medroxyPROGESTERone (PROVERA) 10 MG tablet Take 1 tablet (10 mg total) by mouth daily. Patient not taking: Reported on 07/18/2018 06/17/18    Jennifer Cain, Deborah B, MD  ondansetron (ZOFRAN ODT) 4 MG disintegrating tablet Take 1 tablet (4 mg total) by mouth every 8 (eight) hours as needed for nausea or vomiting. Patient not taking: Reported on 06/17/2018 04/12/18   Geiple, Joshua, PA-C  pantoprazole (PROTONIX) 20 MG tablet Take 1 tablet (20 mg total) by mouth 2 (two) times daily. Patient not taking: Reported on 06/17/2018 03/16/18   Lockwood, Robert, MD  predniSONE (DELTASONE) 20 MG tablet Take 2 tablets (40 mg total) by mouth daily with breakfast. For the next four days Patient not taking: Reported on 07/18/2018 03/16/18   Lockwood, Robert, MD    Current Facility-Administered Medications  Medication Dose Route Frequency Provider Last Rate Last Dose  . acetaminophen (TYLENOL) tablet 650 mg  650 mg Oral Q6H PRN Jennifer Cain-Pitts, Endia, MD       Or  . acetaminophen (TYLENOL) suppository 650 mg  650 mg Rectal Q6H PRN Jennifer Cain-Pitts, Endia, MD      . dextrose 5 % and 0.9% NaCl with thiamine 100 mg, folic acid 1 mg, multivitamins adult 10 mL infusion   Intravenous Continuous Sheikh, Omair Latif, DO      . enoxaparin (LOVENOX) injection 40 mg  40 mg Subcutaneous QHS Jennifer Cain-Pitts, Endia, MD   40 mg at 07/18/18 2343  . ketorolac (TORADOL) 15 MG/ML injection 15 mg  15 mg Intravenous Q6H PRN Jennifer Cain-Pitts, Endia, MD      . morphine 4 MG/ML injection 4 mg  4 mg Intravenous Q4H PRN Jennifer Cain-Pitts, Endia, MD      . ondansetron (ZOFRAN) tablet 4 mg  4 mg Oral Q6H PRN Jennifer Cain-Pitts, Endia, MD       Or  . ondansetron (ZOFRAN) injection 4 mg  4 mg Intravenous Q6H PRN Jennifer Cain-Pitts, Endia, MD        Allergies as of 07/18/2018  . (No Known Allergies)    Family History  Problem Relation Age of Onset  . GI Disease Neg Hx   . Autoimmune disease Neg Hx     Social History   Socioeconomic History  . Marital status: Single    Spouse name: Not on file  . Number of children: Not on file  . Years of education: Not on file  . Highest education level: Not on  file  Occupational History  . Not on file  Social Needs  . Financial resource strain: Not on file  . Food insecurity:    Worry: Not on file    Inability: Not on file  . Transportation needs:    Medical: Not on file    Non-medical: Not on file  Tobacco Use  . Smoking status: Never Smoker  . Smokeless tobacco: Never Used  Substance and Sexual Activity  . Alcohol use: Never    Frequency: Never  . Drug use: Never  . Sexual activity: Not on file  Lifestyle  . Physical activity:    Days per week: Not on file    Minutes per session: Not on file  . Stress: Not on file  Relationships  . Social connections:    Talks on phone: Not on file    Gets together: Not on file      Attends religious service: Not on file    Active member of club or organization: Not on file    Attends meetings of clubs or organizations: Not on file    Relationship status: Not on file  . Intimate partner violence:    Fear of current or ex partner: Not on file    Emotionally abused: Not on file    Physically abused: Not on file    Forced sexual activity: Not on file  Other Topics Concern  . Not on file  Social History Narrative  . Not on file    Review of Systems: All systems reviewed and negative except where noted in HPI.  Physical Exam: Vital signs in last 24 hours: Temp:  [98.4 F (36.9 C)-99.1 F (37.3 C)] 98.8 F (37.1 C) (08/30 0613) Pulse Rate:  [90-134] 99 (08/30 0613) Resp:  [12-18] 14 (08/30 0613) BP: (116-132)/(75-100) 121/81 (08/30 0613) SpO2:  [97 %-100 %] 100 % (08/30 0613) Weight:  [45.1 kg-46.8 kg] 46.8 kg (08/29 2016)   General:   Alert, thin female in NAD Psych:  Pleasant, cooperative. Normal mood and affect. Eyes:  Pupils equal, sclera clear, no icterus.   Conjunctiva pink. Ears:  Normal auditory acuity. Nose:  No deformity, discharge,  or lesions. Neck:  Supple; no masses Lungs:  Clear throughout to auscultation.   No wheezes, crackles, or rhonchi.  Heart:  Regular rate  and rhythm; no murmurs, no edema Abdomen:  Soft, non-distended, nontender, BS active, no palp mass    Rectal:  Deferred  Msk:  Symmetrical without gross deformities. . Neurologic:  Alert and  oriented x4;  grossly normal neurologically. Skin:  Intact without significant lesions or rashes..   Intake/Output from previous day: No intake/output data recorded. Intake/Output this shift: Total I/O In: 200 [P.O.:200] Out: 1 [Urine:1]  Lab Results: Recent Labs    07/18/18 1455 07/18/18 2233 07/19/18 0929  WBC 7.3 5.3 5.3  HGB 9.7* 9.4* 9.7*  HCT 29.4* 28.3* 30.3*  PLT 539* 514* 571*   BMET Recent Labs    07/18/18 1455 07/18/18 2233 07/19/18 0929  NA 141  --  138  K 4.0  --  3.2*  CL 107  --  104  CO2 27  --  23  GLUCOSE 69*  --  66*  BUN 7  --  5*  CREATININE 0.45 0.51 0.38*  CALCIUM 7.8*  --  8.1*   LFT Recent Labs    07/19/18 0929  PROT 6.0*  ALBUMIN 1.8*  AST 26  ALT 15  ALKPHOS 115  BILITOT 0.3     Studies/Results: Ct Abdomen Pelvis Wo Contrast  Result Date: 07/18/2018 CLINICAL DATA:  Initial evaluation for unintended weight loss, generalized abdominal pain. EXAM: CT CHEST, ABDOMEN AND PELVIS WITHOUT CONTRAST TECHNIQUE: Multidetector CT imaging of the chest, abdomen and pelvis was performed following the standard protocol without IV contrast. COMPARISON:  Prior CT from 03/16/2018. FINDINGS: CT CHEST FINDINGS Cardiovascular: Limited noncontrast evaluation of the intrathoracic aorta are grossly unremarkable. No appreciable aneurysm. Partially visualized great vessels grossly within normal limits. Heart size normal. Decreased density within the cardiac blood pool, suggesting anemia. No pericardial effusion. Mediastinum/Nodes: Visualized thyroid normal. No enlarged mediastinal, hilar, or axillary lymph nodes identified on this noncontrast examination. No mediastinal mass. Limited noncontrast evaluation of the esophagus grossly unremarkable. Lungs/Pleura:  Tracheobronchial tree widely patent and intact. Lungs well inflated bilaterally. Lungs are clear without focal infiltrate, pulmonary edema, or pleural effusion. No pneumothorax. No worrisome pulmonary nodule or mass.   Musculoskeletal: External soft tissues demonstrate no acute finding. No acute osseous abnormality. No worrisome lytic or blastic osseous lesions. Mild S-shaped thoracolumbar scoliosis noted. CT ABDOMEN PELVIS FINDINGS Hepatobiliary: Mild diffuse hypoattenuation liver suggestive of steatosis. Limited noncontrast evaluation of the liver otherwise unremarkable. Gallbladder within normal limits. No biliary dilatation. Pancreas: Pancreas within normal limits. Spleen: Spleen within normal limits. Adrenals/Urinary Tract: Adrenal glands grossly unremarkable. Kidneys equal in size without evidence for nephrolithiasis or hydronephrosis. No appreciable hydroureter. Bladder moderately distended without acute abnormality. Stomach/Bowel: Stomach within normal limits. No evidence for bowel obstruction. Abnormal wall thickening seen about multiple loops of distal bowel, likely ileum, clustered within the mid and right lower abdomen. Findings consistent with an acute enteritis, which could be either infectious or inflammatory in nature. Finding is similar and may be slightly more pronounced as compared to prior CT. Appendix within normal limits. No other acute inflammatory changes about the bowels.: Grossly within normal limits. Vascular/Lymphatic: Intra-abdominal aorta of normal caliber. Again seen are multiple enlarged mesenteric lymph nodes, largest of which measures approximately 16 mm (series 2, image 80). Findings are indeterminate, but could be reactive. No other appreciable adenopathy on this noncontrast examination. Reproductive: Uterus and ovaries within normal limits. Other: No free air or fluid. Musculoskeletal: No acute osseous abnormality. No worrisome lytic or blastic osseous lesions. IMPRESSION: 1.  Abnormal circumferential wall thickening about multiple loops of distal small bowel within the mid and lower abdomen, compatible with acute enteritis. Findings are similar relative to prior CT from 03/16/2018, and again could be either infectious or inflammatory in nature. Findings are relatively similar as compared to prior CT from 03/16/2018. 2. Enlarged mesenteric adenopathy, likely reactive. Findings also similar to previous. 3. Hepatic steatosis. Electronically Signed   By: Benjamin  McClintock M.D.   On: 07/18/2018 16:36   Ct Head Wo Contrast  Result Date: 07/18/2018 CLINICAL DATA:  Abdominal pain, unintended weight loss. EXAM: CT HEAD WITHOUT CONTRAST TECHNIQUE: Contiguous axial images were obtained from the base of the skull through the vertex without intravenous contrast. COMPARISON:  None. FINDINGS: BRAIN: No intraparenchymal hemorrhage, mass effect nor midline shift. Mild parenchymal brain volume loss. No hydrocephalus. No acute large vascular territory infarcts. No abnormal extra-axial fluid collections. Basal cisterns are patent. Empty sella. VASCULAR: Unremarkable. SKULL/SOFT TISSUES: No skull fracture. Persistent metopic suture. No significant soft tissue swelling. ORBITS/SINUSES: The included ocular globes and orbital contents are normal.Trace paranasal sinus mucosal thickening. Mastoid air cells are well aerated. OTHER: None. IMPRESSION: 1. No acute intracranial process. 2. Mild parenchymal brain volume loss. 3. Empty sella. Electronically Signed   By: Courtnay  Bloomer M.D.   On: 07/18/2018 16:08   Ct Chest Wo Contrast  Result Date: 07/18/2018 CLINICAL DATA:  Initial evaluation for unintended weight loss, generalized abdominal pain. EXAM: CT CHEST, ABDOMEN AND PELVIS WITHOUT CONTRAST TECHNIQUE: Multidetector CT imaging of the chest, abdomen and pelvis was performed following the standard protocol without IV contrast. COMPARISON:  Prior CT from 03/16/2018. FINDINGS: CT CHEST FINDINGS  Cardiovascular: Limited noncontrast evaluation of the intrathoracic aorta are grossly unremarkable. No appreciable aneurysm. Partially visualized great vessels grossly within normal limits. Heart size normal. Decreased density within the cardiac blood pool, suggesting anemia. No pericardial effusion. Mediastinum/Nodes: Visualized thyroid normal. No enlarged mediastinal, hilar, or axillary lymph nodes identified on this noncontrast examination. No mediastinal mass. Limited noncontrast evaluation of the esophagus grossly unremarkable. Lungs/Pleura: Tracheobronchial tree widely patent and intact. Lungs well inflated bilaterally. Lungs are clear without focal infiltrate, pulmonary edema, or pleural effusion. No   pneumothorax. No worrisome pulmonary nodule or mass. Musculoskeletal: External soft tissues demonstrate no acute finding. No acute osseous abnormality. No worrisome lytic or blastic osseous lesions. Mild S-shaped thoracolumbar scoliosis noted. CT ABDOMEN PELVIS FINDINGS Hepatobiliary: Mild diffuse hypoattenuation liver suggestive of steatosis. Limited noncontrast evaluation of the liver otherwise unremarkable. Gallbladder within normal limits. No biliary dilatation. Pancreas: Pancreas within normal limits. Spleen: Spleen within normal limits. Adrenals/Urinary Tract: Adrenal glands grossly unremarkable. Kidneys equal in size without evidence for nephrolithiasis or hydronephrosis. No appreciable hydroureter. Bladder moderately distended without acute abnormality. Stomach/Bowel: Stomach within normal limits. No evidence for bowel obstruction. Abnormal wall thickening seen about multiple loops of distal bowel, likely ileum, clustered within the mid and right lower abdomen. Findings consistent with an acute enteritis, which could be either infectious or inflammatory in nature. Finding is similar and may be slightly more pronounced as compared to prior CT. Appendix within normal limits. No other acute inflammatory  changes about the bowels.: Grossly within normal limits. Vascular/Lymphatic: Intra-abdominal aorta of normal caliber. Again seen are multiple enlarged mesenteric lymph nodes, largest of which measures approximately 16 mm (series 2, image 80). Findings are indeterminate, but could be reactive. No other appreciable adenopathy on this noncontrast examination. Reproductive: Uterus and ovaries within normal limits. Other: No free air or fluid. Musculoskeletal: No acute osseous abnormality. No worrisome lytic or blastic osseous lesions. IMPRESSION: 1. Abnormal circumferential wall thickening about multiple loops of distal small bowel within the mid and lower abdomen, compatible with acute enteritis. Findings are similar relative to prior CT from 03/16/2018, and again could be either infectious or inflammatory in nature. Findings are relatively similar as compared to prior CT from 03/16/2018. 2. Enlarged mesenteric adenopathy, likely reactive. Findings also similar to previous. 3. Hepatic steatosis. Electronically Signed   By: Benjamin  McClintock M.D.   On: 07/18/2018 16:36    Paula Guenther, NP-C @  07/19/2018, 10:51 AM   Attending physician's note   I have taken an interval history, reviewed the chart and examined the patient. I agree with the Advanced Practitioner's note, impression, and recommendations as outlined.  22-year-old female with a several month history of abdominal pain, nausea, nonbloody emesis, loose/watery nonbloody stools presenting with failure to thrive.  She was scheduled for an initial outpatient appointment with me next week, but brought to the emergency department for increasing symptoms.  She has a 25 pound documented weight loss over the last several months and reports overall 80 pound weight loss over the last year.  She has been evaluated for symptoms as an outpatient without any improvement with trials of Protonix, Zofran, Bentyl and a short course of steroids.  Outpatient evaluation to  date notable for negative QuantiFERON gold, gliadin, TTG IgA, ANA, TSH, lipase, FOBT, HIV.  CBC on 06/20/2018 notable for WBC 4.4 hemoglobin/hematocrit 10/30 platelets 235 with MCV/RDW 85/15.  She has had stable normocytic anemia since at least May.  Iron studies in May notable for ferritin 212, iron 34, TIBC 102, iron sat 33% with B12/folate 849/7.4.  CT in April similar to admission CT last evening which demonstrates mild hypoattenuation of the liver without biliary dilatation and a normal-appearing gallbladder, pancreas, spleen.  There is ileal thickening in the mid/right lower quadrant with multiple small mesenteric lymph nodes, with the largest measuring 16 mm.  She states that her symptoms have been progressive over the last several months, without any preceding travel, antibiotics, hospitalizations, trauma, new medications.  She denies alcohol, drug use, supplements, herbals.  No known family history   of IBD, GI malignancy, celiac, hepatobiliary disease, pancreatic disease.  No prior EGD or colonoscopy.  Overall clinical presentation to include hypoalbuminemia (1.7), anemia, CT findings all certainly concerning.  Discussed DDX at length to include Crohn's disease, malignancy, lymphoma, mesenteric lymphadenitis and plan to further evaluate with EGD and colonoscopy.  If unrevealing may need to consider video capsule endoscopy for small bowel interrogation.  Due to significant weight loss and evidence of malnutrition, agree with concern for micronutrient deficiency and empiric treatment with banana bag.  Also cautious for refeeding syndrome when tolerating p.o. again- monitor with serial BMP to include mag and phosphorus with repletion as needed.  We will also obtain pre-albumin as well as micronutrient and fat-soluble vitamin evaluation (vitamin D, INR, vitamin E).    Check stool studies to include fecal lactoferrin, cultures, Giardia.  Clears okay for now with plan for bowel prep overnight and n.p.o. at  midnight for EGD/colonoscopy tomorrow.  Amberlea Spagnuolo, DO, FACG [phone] office     

## 2018-07-19 NOTE — Consult Note (Addendum)
Referring Provider: [facility]  Primary Care Physician:  Ladell Pier, MD Primary Gastroenterologist:  Has a new patient appt with Gerrit Heck scheduled for 07/23/18.   Reason for Consultation:   Nausea, vomiting, diarrhea, abdominal pain, weight loss and abnormal CT scan.     ASSESSMENT AND PLAN:    1. 22 yo female with several month history of abdominal pain , N / V / non-bloody associated with unintentional (documented) weight loss of 27 pound weight loss since late April. Symptoms in setting of CT scan in April and this admission revealing abnormal thickening of several distal small bowel loops with mesenteric adenopathy. Certainly need to exclude small bowel Crohn's disease in this patient.  -patient will be scheduled for EGD / colonoscopy with probably biopsies. The risks and benefits of EGD / colonoscopy were discussed and the patient agrees to proceed.  -check pre-albumin. She is already getting a banana bag.  -I stopped DVT prophylaxis lovenox after tonight's dose. Will need to address whether to resume tomorrow depending on endoscopic findings and intervention.    2. [place] anemia. Hgb stable ~ 9.7. Ferritin > 200 in late April. Even in setting of inflammation this makes IDA seem unlikely  HPI: Jennifer Cain is a 22 y.o. female who has been having fatigue, N/V,non-bloody diarrhea and diffuse abdominal pain for months. She in ED late April for evaluation of sx. At that time she reported a 30 pound weight loss. Albumin was < 2, she was anemic with a hgb of 9.9( down from 12). Normal MCV. Ferritin 212.  Lipase and liver chemistries okay.  CT scan showed circumferential wall thickening of the mid and distal small bowel compatible with enteritis, multiple enlarged small bowel mesenteric lymph nodes may be reactive in etiology. ED prescribed bentyl, PPI, and prednisone. Recommended to see GI. Back to ED late May with same sx. Hgb about the same. WBC was normal. Platelets were high  at 582. She  had not seen GI yet probably because she had no PCP to refer her. Established care with Dr. Wynetta Emery late July. ANA, tTg ab just above normal at 7,antigliadin ab IgA 9. She was referred to GI and was to see [place] on 07/23/18 but admitted in interim after presenting to ED with generalized pain   ED evaluation yesterday:  WBC 5.3 Hgb 9.4, MCV 86 Platelets 514 K+ 4.0 Normal renal function Albumin 1.7, remainder of liver chemistries WNL CRP 1.8 UDS negative  CT abd / pelvis w/o constrast IMPRESSION: 1. Abnormal circumferential wall thickening about multiple loops of distal small bowel within the mid and lower abdomen, compatible with acute enteritis. Findings are similar relative to prior CT from 03/16/2018, and again could be either infectious or inflammatory in nature. Findings are relatively similar as compared to prior CT from 03/16/2018. 2. Enlarged mesenteric adenopathy, likely reactive. Findings also similar to previous. 3. Hepatic steatosis.  Jennifer describes loose, non-bloody stools ~ 3 times a day for months with some nocturnal stooling. She has frequent N/V and describes diffuse abdominal pain, worse after eating. She recalls that the 2 weeks of prednisone prescribed by ED in late April didn't help. She doesn't think any of the other meds helped either. No Basin of IBD. She has tingling in legs when getting up from bed sometimes. No definite joint pains. No skin lesions   History reviewed. No pertinent past medical history.  History reviewed. No pertinent surgical history.  Prior to Admission medications   Medication Sig Start Date End  Date Taking? Authorizing Provider  dicyclomine (BENTYL) 20 MG tablet Take 1 tablet (20 mg total) by mouth 2 (two) times daily. Patient not taking: Reported on 06/17/2018 03/16/18   Carmin Muskrat, MD  medroxyPROGESTERone (PROVERA) 10 MG tablet Take 1 tablet (10 mg total) by mouth daily. Patient not taking: Reported on 07/18/2018 06/17/18    Ladell Pier, MD  ondansetron (ZOFRAN ODT) 4 MG disintegrating tablet Take 1 tablet (4 mg total) by mouth every 8 (eight) hours as needed for nausea or vomiting. Patient not taking: Reported on 06/17/2018 04/12/18   Carlisle Cater, PA-C  pantoprazole (PROTONIX) 20 MG tablet Take 1 tablet (20 mg total) by mouth 2 (two) times daily. Patient not taking: Reported on 06/17/2018 03/16/18   Carmin Muskrat, MD  predniSONE (DELTASONE) 20 MG tablet Take 2 tablets (40 mg total) by mouth daily with breakfast. For the next four days Patient not taking: Reported on 07/18/2018 03/16/18   Carmin Muskrat, MD    Current Facility-Administered Medications  Medication Dose Route Frequency Provider Last Rate Last Dose  . acetaminophen (TYLENOL) tablet 650 mg  650 mg Oral Q6H PRN Johnson-Pitts, Endia, MD       Or  . acetaminophen (TYLENOL) suppository 650 mg  650 mg Rectal Q6H PRN Johnson-Pitts, Endia, MD      . dextrose 5 % and 0.9% NaCl with thiamine 175 mg, folic acid 1 mg, multivitamins adult 10 mL infusion   Intravenous Continuous Sheikh, Omair Latif, DO      . enoxaparin (LOVENOX) injection 40 mg  40 mg Subcutaneous QHS Johnson-Pitts, Endia, MD   40 mg at 07/18/18 2343  . ketorolac (TORADOL) 15 MG/ML injection 15 mg  15 mg Intravenous Q6H PRN Johnson-Pitts, Endia, MD      . morphine 4 MG/ML injection 4 mg  4 mg Intravenous Q4H PRN Johnson-Pitts, Endia, MD      . ondansetron (ZOFRAN) tablet 4 mg  4 mg Oral Q6H PRN Johnson-Pitts, Endia, MD       Or  . ondansetron (ZOFRAN) injection 4 mg  4 mg Intravenous Q6H PRN Johnson-Pitts, Endia, MD        Allergies as of 07/18/2018  . (No Known Allergies)    Family History  Problem Relation Age of Onset  . GI Disease Neg Hx   . Autoimmune disease Neg Hx     Social History   Socioeconomic History  . Marital status: Single    Spouse name: Not on file  . Number of children: Not on file  . Years of education: Not on file  . Highest education level: Not on  file  Occupational History  . Not on file  Social Needs  . Financial resource strain: Not on file  . Food insecurity:    Worry: Not on file    Inability: Not on file  . Transportation needs:    Medical: Not on file    Non-medical: Not on file  Tobacco Use  . Smoking status: Never Smoker  . Smokeless tobacco: Never Used  Substance and Sexual Activity  . Alcohol use: Never    Frequency: Never  . Drug use: Never  . Sexual activity: Not on file  Lifestyle  . Physical activity:    Days per week: Not on file    Minutes per session: Not on file  . Stress: Not on file  Relationships  . Social connections:    Talks on phone: Not on file    Gets together: Not on file  Attends religious service: Not on file    Active member of club or organization: Not on file    Attends meetings of clubs or organizations: Not on file    Relationship status: Not on file  . Intimate partner violence:    Fear of current or ex partner: Not on file    Emotionally abused: Not on file    Physically abused: Not on file    Forced sexual activity: Not on file  Other Topics Concern  . Not on file  Social History Narrative  . Not on file    Review of Systems: All systems reviewed and negative except where noted in HPI.  Physical Exam: Vital signs in last 24 hours: Temp:  [98.4 F (36.9 C)-99.1 F (37.3 C)] 98.8 F (37.1 C) (08/30 3825) Pulse Rate:  [90-134] 99 (08/30 0613) Resp:  [12-18] 14 (08/30 0613) BP: (116-132)/(75-100) 121/81 (08/30 0613) SpO2:  [97 %-100 %] 100 % (08/30 0539) Weight:  [45.1 kg-46.8 kg] 46.8 kg (08/29 2016)   General:   Alert, thin female in NAD Psych:  Pleasant, cooperative. Normal mood and affect. Eyes:  Pupils equal, sclera clear, no icterus.   Conjunctiva pink. Ears:  Normal auditory acuity. Nose:  No deformity, discharge,  or lesions. Neck:  Supple; no masses Lungs:  Clear throughout to auscultation.   No wheezes, crackles, or rhonchi.  Heart:  Regular rate  and rhythm; no murmurs, no edema Abdomen:  Soft, non-distended, nontender, BS active, no palp mass    Rectal:  Deferred  Msk:  Symmetrical without gross deformities. . Neurologic:  Alert and  oriented x4;  grossly normal neurologically. Skin:  Intact without significant lesions or rashes..   Intake/Output from previous day: No intake/output data recorded. Intake/Output this shift: Total I/O In: 200 [P.O.:200] Out: 1 [Urine:1]  Lab Results: Recent Labs    07/18/18 1455 07/18/18 2233 07/19/18 0929  WBC 7.3 5.3 5.3  HGB 9.7* 9.4* 9.7*  HCT 29.4* 28.3* 30.3*  PLT 539* 514* 571*   BMET Recent Labs    07/18/18 1455 07/18/18 2233 07/19/18 0929  NA 141  --  138  K 4.0  --  3.2*  CL 107  --  104  CO2 27  --  23  GLUCOSE 69*  --  66*  BUN 7  --  5*  CREATININE 0.45 0.51 0.38*  CALCIUM 7.8*  --  8.1*   LFT Recent Labs    07/19/18 0929  PROT 6.0*  ALBUMIN 1.8*  AST 26  ALT 15  ALKPHOS 115  BILITOT 0.3     Studies/Results: Ct Abdomen Pelvis Wo Contrast  Result Date: 07/18/2018 CLINICAL DATA:  Initial evaluation for unintended weight loss, generalized abdominal pain. EXAM: CT CHEST, ABDOMEN AND PELVIS WITHOUT CONTRAST TECHNIQUE: Multidetector CT imaging of the chest, abdomen and pelvis was performed following the standard protocol without IV contrast. COMPARISON:  Prior CT from 03/16/2018. FINDINGS: CT CHEST FINDINGS Cardiovascular: Limited noncontrast evaluation of the intrathoracic aorta are grossly unremarkable. No appreciable aneurysm. Partially visualized great vessels grossly within normal limits. Heart size normal. Decreased density within the cardiac blood pool, suggesting anemia. No pericardial effusion. Mediastinum/Nodes: Visualized thyroid normal. No enlarged mediastinal, hilar, or axillary lymph nodes identified on this noncontrast examination. No mediastinal mass. Limited noncontrast evaluation of the esophagus grossly unremarkable. Lungs/Pleura:  Tracheobronchial tree widely patent and intact. Lungs well inflated bilaterally. Lungs are clear without focal infiltrate, pulmonary edema, or pleural effusion. No pneumothorax. No worrisome pulmonary nodule or mass.  Musculoskeletal: External soft tissues demonstrate no acute finding. No acute osseous abnormality. No worrisome lytic or blastic osseous lesions. Mild S-shaped thoracolumbar scoliosis noted. CT ABDOMEN PELVIS FINDINGS Hepatobiliary: Mild diffuse hypoattenuation liver suggestive of steatosis. Limited noncontrast evaluation of the liver otherwise unremarkable. Gallbladder within normal limits. No biliary dilatation. Pancreas: Pancreas within normal limits. Spleen: Spleen within normal limits. Adrenals/Urinary Tract: Adrenal glands grossly unremarkable. Kidneys equal in size without evidence for nephrolithiasis or hydronephrosis. No appreciable hydroureter. Bladder moderately distended without acute abnormality. Stomach/Bowel: Stomach within normal limits. No evidence for bowel obstruction. Abnormal wall thickening seen about multiple loops of distal bowel, likely ileum, clustered within the mid and right lower abdomen. Findings consistent with an acute enteritis, which could be either infectious or inflammatory in nature. Finding is similar and may be slightly more pronounced as compared to prior CT. Appendix within normal limits. No other acute inflammatory changes about the bowels.: Grossly within normal limits. Vascular/Lymphatic: Intra-abdominal aorta of normal caliber. Again seen are multiple enlarged mesenteric lymph nodes, largest of which measures approximately 16 mm (series 2, image 80). Findings are indeterminate, but could be reactive. No other appreciable adenopathy on this noncontrast examination. Reproductive: Uterus and ovaries within normal limits. Other: No free air or fluid. Musculoskeletal: No acute osseous abnormality. No worrisome lytic or blastic osseous lesions. IMPRESSION: 1.  Abnormal circumferential wall thickening about multiple loops of distal small bowel within the mid and lower abdomen, compatible with acute enteritis. Findings are similar relative to prior CT from 03/16/2018, and again could be either infectious or inflammatory in nature. Findings are relatively similar as compared to prior CT from 03/16/2018. 2. Enlarged mesenteric adenopathy, likely reactive. Findings also similar to previous. 3. Hepatic steatosis. Electronically Signed   By: Jeannine Boga M.D.   On: 07/18/2018 16:36   Ct Head Wo Contrast  Result Date: 07/18/2018 CLINICAL DATA:  Abdominal pain, unintended weight loss. EXAM: CT HEAD WITHOUT CONTRAST TECHNIQUE: Contiguous axial images were obtained from the base of the skull through the vertex without intravenous contrast. COMPARISON:  None. FINDINGS: BRAIN: No intraparenchymal hemorrhage, mass effect nor midline shift. Mild parenchymal brain volume loss. No hydrocephalus. No acute large vascular territory infarcts. No abnormal extra-axial fluid collections. Basal cisterns are patent. Empty sella. VASCULAR: Unremarkable. SKULL/SOFT TISSUES: No skull fracture. Persistent metopic suture. No significant soft tissue swelling. ORBITS/SINUSES: The included ocular globes and orbital contents are normal.Trace paranasal sinus mucosal thickening. Mastoid air cells are well aerated. OTHER: None. IMPRESSION: 1. No acute intracranial process. 2. Mild parenchymal brain volume loss. 3. Empty sella. Electronically Signed   By: Elon Alas M.D.   On: 07/18/2018 16:08   Ct Chest Wo Contrast  Result Date: 07/18/2018 CLINICAL DATA:  Initial evaluation for unintended weight loss, generalized abdominal pain. EXAM: CT CHEST, ABDOMEN AND PELVIS WITHOUT CONTRAST TECHNIQUE: Multidetector CT imaging of the chest, abdomen and pelvis was performed following the standard protocol without IV contrast. COMPARISON:  Prior CT from 03/16/2018. FINDINGS: CT CHEST FINDINGS  Cardiovascular: Limited noncontrast evaluation of the intrathoracic aorta are grossly unremarkable. No appreciable aneurysm. Partially visualized great vessels grossly within normal limits. Heart size normal. Decreased density within the cardiac blood pool, suggesting anemia. No pericardial effusion. Mediastinum/Nodes: Visualized thyroid normal. No enlarged mediastinal, hilar, or axillary lymph nodes identified on this noncontrast examination. No mediastinal mass. Limited noncontrast evaluation of the esophagus grossly unremarkable. Lungs/Pleura: Tracheobronchial tree widely patent and intact. Lungs well inflated bilaterally. Lungs are clear without focal infiltrate, pulmonary edema, or pleural effusion. No  pneumothorax. No worrisome pulmonary nodule or mass. Musculoskeletal: External soft tissues demonstrate no acute finding. No acute osseous abnormality. No worrisome lytic or blastic osseous lesions. Mild S-shaped thoracolumbar scoliosis noted. CT ABDOMEN PELVIS FINDINGS Hepatobiliary: Mild diffuse hypoattenuation liver suggestive of steatosis. Limited noncontrast evaluation of the liver otherwise unremarkable. Gallbladder within normal limits. No biliary dilatation. Pancreas: Pancreas within normal limits. Spleen: Spleen within normal limits. Adrenals/Urinary Tract: Adrenal glands grossly unremarkable. Kidneys equal in size without evidence for nephrolithiasis or hydronephrosis. No appreciable hydroureter. Bladder moderately distended without acute abnormality. Stomach/Bowel: Stomach within normal limits. No evidence for bowel obstruction. Abnormal wall thickening seen about multiple loops of distal bowel, likely ileum, clustered within the mid and right lower abdomen. Findings consistent with an acute enteritis, which could be either infectious or inflammatory in nature. Finding is similar and may be slightly more pronounced as compared to prior CT. Appendix within normal limits. No other acute inflammatory  changes about the bowels.: Grossly within normal limits. Vascular/Lymphatic: Intra-abdominal aorta of normal caliber. Again seen are multiple enlarged mesenteric lymph nodes, largest of which measures approximately 16 mm (series 2, image 80). Findings are indeterminate, but could be reactive. No other appreciable adenopathy on this noncontrast examination. Reproductive: Uterus and ovaries within normal limits. Other: No free air or fluid. Musculoskeletal: No acute osseous abnormality. No worrisome lytic or blastic osseous lesions. IMPRESSION: 1. Abnormal circumferential wall thickening about multiple loops of distal small bowel within the mid and lower abdomen, compatible with acute enteritis. Findings are similar relative to prior CT from 03/16/2018, and again could be either infectious or inflammatory in nature. Findings are relatively similar as compared to prior CT from 03/16/2018. 2. Enlarged mesenteric adenopathy, likely reactive. Findings also similar to previous. 3. Hepatic steatosis. Electronically Signed   By: Jeannine Boga M.D.   On: 07/18/2018 16:36    Tye Savoy, NP-C @  07/19/2018, 10:51 AM   Attending physician's note   I have taken an interval history, reviewed the chart and examined the patient. I agree with the Advanced Practitioner's note, impression, and recommendations as outlined.  22 year old female with a several month history of abdominal pain, nausea, nonbloody emesis, loose/watery nonbloody stools presenting with failure to thrive.  She was scheduled for an initial outpatient appointment with me next week, but brought to the emergency department for increasing symptoms.  She has a 25 pound documented weight loss over the last several months and reports overall 80 pound weight loss over the last year.  She has been evaluated for symptoms as an outpatient without any improvement with trials of Protonix, Zofran, Bentyl and a short course of steroids.  Outpatient evaluation to  date notable for negative QuantiFERON gold, gliadin, TTG IgA, ANA, TSH, lipase, FOBT, HIV.  CBC on 06/20/2018 notable for WBC 4.4 hemoglobin/hematocrit 10/30 platelets 235 with MCV/RDW 85/15.  She has had stable normocytic anemia since at least May.  Iron studies in May notable for ferritin 212, iron 34, TIBC 102, iron sat 33% with B12/folate 849/7.4.  CT in April similar to admission CT last evening which demonstrates mild hypoattenuation of the liver without biliary dilatation and a normal-appearing gallbladder, pancreas, spleen.  There is ileal thickening in the mid/right lower quadrant with multiple small mesenteric lymph nodes, with the largest measuring 16 mm.  She states that her symptoms have been progressive over the last several months, without any preceding travel, antibiotics, hospitalizations, trauma, new medications.  She denies alcohol, drug use, supplements, herbals.  No known family history  of IBD, GI malignancy, celiac, hepatobiliary disease, pancreatic disease.  No prior EGD or colonoscopy.  Overall clinical presentation to include hypoalbuminemia (1.7), anemia, CT findings all certainly concerning.  Discussed DDX at length to include Crohn's disease, malignancy, lymphoma, mesenteric lymphadenitis and plan to further evaluate with EGD and colonoscopy.  If unrevealing may need to consider video capsule endoscopy for small bowel interrogation.  Due to significant weight loss and evidence of malnutrition, agree with concern for micronutrient deficiency and empiric treatment with banana bag.  Also cautious for refeeding syndrome when tolerating p.o. again- monitor with serial BMP to include mag and phosphorus with repletion as needed.  We will also obtain pre-albumin as well as micronutrient and fat-soluble vitamin evaluation (vitamin D, INR, vitamin E).    Check stool studies to include fecal lactoferrin, cultures, Giardia.  Clears okay for now with plan for bowel prep overnight and n.p.o. at  midnight for EGD/colonoscopy tomorrow.  [address] DO, FACG [phone] office

## 2018-07-19 NOTE — Progress Notes (Signed)
PROGRESS NOTE    Jennifer Cain  IFO:[id number] DOB: Apr 12, 1996 DOA: 07/18/2018 PCP: Ladell Pier, MD   Brief Narrative:   HPI per Dr. Wells [name] is a 22 y.o. female with medical history significant of many  months of an unintentional weight loss, abdominal pain, nausea and vomiting.  Patient has had numerous ER visits and she has recently been referred to GI for further work-up.  She is Not currently on any medications.  She has been tried on Protonix, Bentyl and a short stent of steroids a few months ago none of which seem to help her symptoms.  The patient was so weak her mother could not get her out of bed which prompted him to bring her into the ED. Per  Report patient had a elevated transglutaminase level with a normal antigliadin IgA antibody.  sHe has a chronic anemia that is stable. She oes admit to depressed mood but denies any suicidal ideation.  States that she is able to eat however when she eats solids it either results in vomiting or diarrhea.  She states she is able to keep down liquids.   blood work was significant for a albumin of 1.7 .  And hemoglobin of 9.7 .  CT scan showed changes suggestive of enteritis but was similar to prior CT done in April.  ED Course: PA consulted GI,  DR Armbruster who recommended admission for FTT.  They  did not recommend any acute treatment at this time. Dr Posey Pronto accepted for admission.  An IV was unable to be obtained so the patient was given IM Ativan and IM morphine and 1 L IV fluid.  **GI Evaluated and patient to undergo EGD and Colonoscopy in AM.   Assessment & Plan:   Principal Problem:   FTT (failure to thrive) in adult Active Problems:   Enteritis-Chronic   Protein calorie malnutrition (HCC)   Chronic diarrhea   Nausea & vomiting-chronic   Normocytic anemia   Generalized weakness   Thrombocytosis (HCC)   Hypokalemia  Nausea/Vomiting/Abdominal Pain/Diarrhea and Weight loss in the setting of Acute Enteritis  r/o Infectious vs. Inflammatory conditions -Admitted to Med-Surge -Patient states that she has lost approximately 90 pounds since last year and lost about 27 since April -States that she has significant nausea, vomiting, and loose stools every morning -Patient states that eating makes her feel nauseous and vomit -CT scan of the Abdomen and Pelvis showed abnormal circumferential wall thickening about multiple loops of the distal small bowel with in the mid and lower abdomen which was compatible with acute enteritis.  These findings are similar relative to the prior CT from 03/16/2018 there is also enlarged mesenteric adenopathy which is likely reactive and similar to previous -Was going to obtain a GI pathogen panel to rule out infectious etiologies but after discussion with gastroenterology will not do so as they feel this is not infectious given no fever or white count -Patient CRP was 1.8 and ESR was 30 -Given a banana bag yesterday and will repeat today at 100 and mL/hr -Gastroenterology consulted for further evaluation recommendations and are planning to take the patient for EGD and colonoscopy in the a.m.  Generalized weakness/Failure to Thrive secondary to above -Continue with IV fluid hydration with normal saline and banana bag at 100 and mils per hour -Nutrition is consulted -PT to evaluate and treat  Normocytic Anemia However patient was therefore 6.8 -Hemoglobin/hematocrit was 9.7/30.3 today similar to previous -Had an anemia panel done on 427/2019 which  showed an iron level 34, U IBC of 68, TIBC 102, saturation ratios of 33%, ferritin level 212, folate level 7.4, and a Vitamin B12 level of 849 -Continue to monitor for signs and symptoms of bleeding -Repeat CBC in the a.m.  Thrombocytosis -Likely Reactive -Patient had a platelet count 514 on admission and today platelet count was 571 -Continue to monitor and Trend -Repeat CBC in AM   Protein Calorie Malnutrition -Patient's  albumin level is 1.8 and prealbumin was less than 5 -Nutrition is consulted for further evaluation recommendations  Hypo-kalemia -Patient potassium was 3.2 this morning -Replete with IV potassium chloride 40 mEq x1 along with p.o. potassium chloride 40 mEq x1 -Continue monitor and replete as necessary -Repeat CMP in a.m.  DVT prophylaxis: Lovenox 30 mg sq q24h (held for EGD and Colon tomorrow)  Code Status: FULL CODE Family Communication: No family present at bedside  Disposition Plan: Anticipate D/C in the next 24-48 hours if medically stable  Consultants:  [facility]   Procedures: EGD and Colonoscopy scheduled for AM  Antimicrobials:  Antibiotics Given (last 72 hours)    None     Subjective: Seen and examined at bedside states that she was tired.  States she has significant nausea and vomiting and has loose stools every morning.  No chest pain, arms breath.  Has lost significant amount of weight.  Denies any fevers or chills and feels okay at this time and understands that she needs further work-up.  Objective: Vitals:   07/18/18 1830 07/18/18 2016 07/18/18 2028 07/19/18 0613  BP: (!) 121/95  (!) 132/96 121/81  Pulse: (!) 101  (!) 105 99  Resp:   12 14  Temp:   99.1 F (37.3 C) 98.8 F (37.1 C)  TempSrc:   Oral Oral  SpO2: 100%  100% 100%  Weight:  46.8 kg    Height:  5' 3"  (1.6 m)      Intake/Output Summary (Last 24 hours) at 07/19/2018 1420 Last data filed at 07/19/2018 1050 Gross per 24 hour  Intake 200 ml  Output 1 ml  Net 199 ml   Filed Weights   07/18/18 1206 07/18/18 2016  Weight: 45.1 kg 46.8 kg   Examination: Physical Exam:  Constitutional: Thin AAF in NAD and appears calm and comfortable Eyes: Lids and conjunctivae normal, sclerae anicteric  ENMT: External Ears, Nose appear normal. Grossly normal hearing. Mucous membranes are moist.  Neck: Appears normal, supple, no cervical masses, normal ROM, no appreciable thyromegaly, no  JVD Respiratory: Diminished to auscultation bilaterally, no wheezing, rales, rhonchi or crackles. Normal respiratory effort and patient is not tachypenic. No accessory muscle use.  Cardiovascular: Slightly tachycardic rate but regular rhythm, no murmurs / rubs / gallops. S1 and S2 auscultated. No extremity edema.  Abdomen: Soft, non-tender, non-distended.  Bowel sounds positive x4.  GU: Deferred. Musculoskeletal: No clubbing / cyanosis of digits/nails. Good ROM, no contractures. Normal strength and muscle tone.  Skin: No rashes, lesions, ulcers on a limited skin evaluation. No induration; Warm and dry.  Neurologic: CN 2-12 grossly intact with no focal deficits. Strength 5/5 in all 4. Romberg sign cerebellar reflexes not assessed.  Psychiatric: Normal judgment and insight. Alert and oriented x 3. Normal mood and appropriate affect.   Data Reviewed: I have personally reviewed following labs and imaging studies  CBC: Recent Labs  Lab 07/18/18 1455 07/18/18 2233 07/19/18 0929  WBC 7.3 5.3 5.3  NEUTROABS 2.3  --  1.8  HGB 9.7* 9.4* 9.7*  HCT  29.4* 28.3* 30.3*  MCV 85.2 86.3 86.8  PLT 539* 514* 401*   Basic Metabolic Panel: Recent Labs  Lab 07/18/18 1455 07/18/18 2233 07/19/18 0929  NA 141  --  138  K 4.0  --  3.2*  CL 107  --  104  CO2 27  --  23  GLUCOSE 69*  --  66*  BUN 7  --  5*  CREATININE 0.45 0.51 0.38*  CALCIUM 7.8*  --  8.1*  MG  --   --  1.8  PHOS  --   --  2.8   GFR: Estimated Creatinine Clearance: 81.5 mL/min (A) (by C-G formula based on SCr of 0.38 mg/dL (L)). Liver Function Tests: Recent Labs  Lab 07/18/18 1455 07/19/18 0929  AST 30 26  ALT 14 15  ALKPHOS 104 115  BILITOT 0.5 0.3  PROT 5.8* 6.0*  ALBUMIN 1.7* 1.8*   No results for input(s): LIPASE, AMYLASE in the last 168 hours. No results for input(s): AMMONIA in the last 168 hours. Coagulation Profile: No results for input(s): INR, PROTIME in the last 168 hours. Cardiac Enzymes: No results for  input(s): CKTOTAL, CKMB, CKMBINDEX, TROPONINI in the last 168 hours. BNP (last 3 results) No results for input(s): PROBNP in the last 8760 hours. HbA1C: Recent Labs    07/19/18 0929  HGBA1C 4.0*   CBG: No results for input(s): GLUCAP in the last 168 hours. Lipid Profile: No results for input(s): CHOL, HDL, LDLCALC, TRIG, CHOLHDL, LDLDIRECT in the last 72 hours. Thyroid Function Tests: No results for input(s): TSH, T4TOTAL, FREET4, T3FREE, THYROIDAB in the last 72 hours. Anemia Panel: No results for input(s): VITAMINB12, FOLATE, FERRITIN, TIBC, IRON, RETICCTPCT in the last 72 hours. Sepsis Labs: No results for input(s): PROCALCITON, LATICACIDVEN in the last 168 hours.  No results found for this or any previous visit (from the past 240 hour(s)).   Radiology Studies: Ct Abdomen Pelvis Wo Contrast  Result Date: 07/18/2018 CLINICAL DATA:  Initial evaluation for unintended weight loss, generalized abdominal pain. EXAM: CT CHEST, ABDOMEN AND PELVIS WITHOUT CONTRAST TECHNIQUE: Multidetector CT imaging of the chest, abdomen and pelvis was performed following the standard protocol without IV contrast. COMPARISON:  Prior CT from 03/16/2018. FINDINGS: CT CHEST FINDINGS Cardiovascular: Limited noncontrast evaluation of the intrathoracic aorta are grossly unremarkable. No appreciable aneurysm. Partially visualized great vessels grossly within normal limits. Heart size normal. Decreased density within the cardiac blood pool, suggesting anemia. No pericardial effusion. Mediastinum/Nodes: Visualized thyroid normal. No enlarged mediastinal, hilar, or axillary lymph nodes identified on this noncontrast examination. No mediastinal mass. Limited noncontrast evaluation of the esophagus grossly unremarkable. Lungs/Pleura: Tracheobronchial tree widely patent and intact. Lungs well inflated bilaterally. Lungs are clear without focal infiltrate, pulmonary edema, or pleural effusion. No pneumothorax. No worrisome  pulmonary nodule or mass. Musculoskeletal: External soft tissues demonstrate no acute finding. No acute osseous abnormality. No worrisome lytic or blastic osseous lesions. Mild S-shaped thoracolumbar scoliosis noted. CT ABDOMEN PELVIS FINDINGS Hepatobiliary: Mild diffuse hypoattenuation liver suggestive of steatosis. Limited noncontrast evaluation of the liver otherwise unremarkable. Gallbladder within normal limits. No biliary dilatation. Pancreas: Pancreas within normal limits. Spleen: Spleen within normal limits. Adrenals/Urinary Tract: Adrenal glands grossly unremarkable. Kidneys equal in size without evidence for nephrolithiasis or hydronephrosis. No appreciable hydroureter. Bladder moderately distended without acute abnormality. Stomach/Bowel: Stomach within normal limits. No evidence for bowel obstruction. Abnormal wall thickening seen about multiple loops of distal bowel, likely ileum, clustered within the mid and right lower abdomen. Findings consistent with  an acute enteritis, which could be either infectious or inflammatory in nature. Finding is similar and may be slightly more pronounced as compared to prior CT. Appendix within normal limits. No other acute inflammatory changes about the bowels.: Grossly within normal limits. Vascular/Lymphatic: Intra-abdominal aorta of normal caliber. Again seen are multiple enlarged mesenteric lymph nodes, largest of which measures approximately 16 mm (series 2, image 80). Findings are indeterminate, but could be reactive. No other appreciable adenopathy on this noncontrast examination. Reproductive: Uterus and ovaries within normal limits. Other: No free air or fluid. Musculoskeletal: No acute osseous abnormality. No worrisome lytic or blastic osseous lesions. IMPRESSION: 1. Abnormal circumferential wall thickening about multiple loops of distal small bowel within the mid and lower abdomen, compatible with acute enteritis. Findings are similar relative to prior CT  from 03/16/2018, and again could be either infectious or inflammatory in nature. Findings are relatively similar as compared to prior CT from 03/16/2018. 2. Enlarged mesenteric adenopathy, likely reactive. Findings also similar to previous. 3. Hepatic steatosis. Electronically Signed   By: Jeannine Boga M.D.   On: 07/18/2018 16:36   Ct Head Wo Contrast  Result Date: 07/18/2018 CLINICAL DATA:  Abdominal pain, unintended weight loss. EXAM: CT HEAD WITHOUT CONTRAST TECHNIQUE: Contiguous axial images were obtained from the base of the skull through the vertex without intravenous contrast. COMPARISON:  None. FINDINGS: BRAIN: No intraparenchymal hemorrhage, mass effect nor midline shift. Mild parenchymal brain volume loss. No hydrocephalus. No acute large vascular territory infarcts. No abnormal extra-axial fluid collections. Basal cisterns are patent. Empty sella. VASCULAR: Unremarkable. SKULL/SOFT TISSUES: No skull fracture. Persistent metopic suture. No significant soft tissue swelling. ORBITS/SINUSES: The included ocular globes and orbital contents are normal.Trace paranasal sinus mucosal thickening. Mastoid air cells are well aerated. OTHER: None. IMPRESSION: 1. No acute intracranial process. 2. Mild parenchymal brain volume loss. 3. Empty sella. Electronically Signed   By: Elon Alas M.D.   On: 07/18/2018 16:08   Ct Chest Wo Contrast  Result Date: 07/18/2018 CLINICAL DATA:  Initial evaluation for unintended weight loss, generalized abdominal pain. EXAM: CT CHEST, ABDOMEN AND PELVIS WITHOUT CONTRAST TECHNIQUE: Multidetector CT imaging of the chest, abdomen and pelvis was performed following the standard protocol without IV contrast. COMPARISON:  Prior CT from 03/16/2018. FINDINGS: CT CHEST FINDINGS Cardiovascular: Limited noncontrast evaluation of the intrathoracic aorta are grossly unremarkable. No appreciable aneurysm. Partially visualized great vessels grossly within normal limits. Heart size  normal. Decreased density within the cardiac blood pool, suggesting anemia. No pericardial effusion. Mediastinum/Nodes: Visualized thyroid normal. No enlarged mediastinal, hilar, or axillary lymph nodes identified on this noncontrast examination. No mediastinal mass. Limited noncontrast evaluation of the esophagus grossly unremarkable. Lungs/Pleura: Tracheobronchial tree widely patent and intact. Lungs well inflated bilaterally. Lungs are clear without focal infiltrate, pulmonary edema, or pleural effusion. No pneumothorax. No worrisome pulmonary nodule or mass. Musculoskeletal: External soft tissues demonstrate no acute finding. No acute osseous abnormality. No worrisome lytic or blastic osseous lesions. Mild S-shaped thoracolumbar scoliosis noted. CT ABDOMEN PELVIS FINDINGS Hepatobiliary: Mild diffuse hypoattenuation liver suggestive of steatosis. Limited noncontrast evaluation of the liver otherwise unremarkable. Gallbladder within normal limits. No biliary dilatation. Pancreas: Pancreas within normal limits. Spleen: Spleen within normal limits. Adrenals/Urinary Tract: Adrenal glands grossly unremarkable. Kidneys equal in size without evidence for nephrolithiasis or hydronephrosis. No appreciable hydroureter. Bladder moderately distended without acute abnormality. Stomach/Bowel: Stomach within normal limits. No evidence for bowel obstruction. Abnormal wall thickening seen about multiple loops of distal bowel, likely ileum, clustered within the mid  and right lower abdomen. Findings consistent with an acute enteritis, which could be either infectious or inflammatory in nature. Finding is similar and may be slightly more pronounced as compared to prior CT. Appendix within normal limits. No other acute inflammatory changes about the bowels.: Grossly within normal limits. Vascular/Lymphatic: Intra-abdominal aorta of normal caliber. Again seen are multiple enlarged mesenteric lymph nodes, largest of which measures  approximately 16 mm (series 2, image 80). Findings are indeterminate, but could be reactive. No other appreciable adenopathy on this noncontrast examination. Reproductive: Uterus and ovaries within normal limits. Other: No free air or fluid. Musculoskeletal: No acute osseous abnormality. No worrisome lytic or blastic osseous lesions. IMPRESSION: 1. Abnormal circumferential wall thickening about multiple loops of distal small bowel within the mid and lower abdomen, compatible with acute enteritis. Findings are similar relative to prior CT from 03/16/2018, and again could be either infectious or inflammatory in nature. Findings are relatively similar as compared to prior CT from 03/16/2018. 2. Enlarged mesenteric adenopathy, likely reactive. Findings also similar to previous. 3. Hepatic steatosis. Electronically Signed   By: Jeannine Boga M.D.   On: 07/18/2018 16:36   Dg Femur Min 2 Views Left  Result Date: 07/18/2018 CLINICAL DATA:  Bilateral lower extremity bone pain for months without known injury. EXAM: LEFT FEMUR 2 VIEWS COMPARISON:  Same day CT abdomen and pelvis FINDINGS: Overlapping AP and frog-leg views of the left femur. Joint spaces appear intact. Femoral head is spherical in appearance and maintained within the acetabular component. There is no evidence of fracture or other focal bone lesions. Enteric contrast within overlying bowel loops simulate sclerotic lesions of the sacrum. Soft tissues are unremarkable. IMPRESSION: No acute osseous abnormality. Electronically Signed   By: Ashley Royalty M.D.   On: 07/18/2018 16:18   Dg Femur Min 2 Views Right  Result Date: 07/18/2018 CLINICAL DATA:  Bilateral lower extremity bone pain for months. No known injury. EXAM: RIGHT FEMUR 2 VIEWS COMPARISON:  Same day CT AP FINDINGS: Overlapping AP and frog-leg views of the right femur. Intact joint spaces. No fracture or suspicious osseous lesions. Soft tissues are unremarkable. Contrast within bowel loops  project over the lower pelvis and sacrum accounting for the areas of hazy increased opacity. No sacral lesion is identified on same day CT. IMPRESSION: No acute osseous abnormality. Electronically Signed   By: Ashley Royalty M.D.   On: 07/18/2018 16:15   Scheduled Meds: . enoxaparin (LOVENOX) injection  40 mg Subcutaneous QHS  . peg 3350 powder  0.5 kit Oral Once   And  . [START ON 07/20/2018] peg 3350 powder  0.5 kit Oral Once  . potassium chloride  40 mEq Oral Once   Continuous Infusions: . banana bag IV 1000 mL    . potassium chloride      LOS: 0 days   Kerney Elbe, DO [facility] PAGER is on [place]  If 7PM-7AM, please contact night-coverage [email or web address] Password [email or web address] 07/19/2018, 2:20 PM

## 2018-07-19 NOTE — Progress Notes (Signed)
Initial Nutrition Assessment  DOCUMENTATION CODES:   Non-severe (moderate) malnutrition in context of chronic illness, Underweight  INTERVENTION:   -Diet advancement per MD -Once diet is advanced, provide Ensure Enlive po BID, each supplement provides 350 kcal and 20 grams of protein -Provide Multivitamin with minerals daily once on diet (and not receiving banana bag)  NUTRITION DIAGNOSIS:   Moderate Malnutrition related to chronic illness(enteritis) as evidenced by percent weight loss, moderate fat depletion, moderate muscle depletion.  GOAL:   Patient will meet greater than or equal to 90% of their needs  MONITOR:   PO intake, Supplement acceptance, Labs, Weight trends, I & O's  REASON FOR ASSESSMENT:   Consult Assessment of nutrition requirement/status, Poor PO  ASSESSMENT:   22 y.o. female with medical history significant of many  months of an unintentional weight loss, abdominal pain, nausea and vomiting.  Admitted for FTT.  Patient reports tolerating clears today with no nausea. Per chart, pt had yogurt this morning, however pt reports only consuming broth. Pt to be NPO after midnight for pending EGD tomorrow. Pt states she tolerated a burger yesterday. Denies N/V afterwards. Per MD note, pt has been having N/V every day. Pt states only certain foods would make her sick but could not identify a pattern. Pt tried to drink Ensure supplements at home since starting to lose weight.   Per patient, UBW is 160-170 lb. Pt weighed 130 lb in April 2019 (pt has had a 21% wt loss x 4 months, significant for time frame).   Medications: K-DUR tablet once, Banana bag @ 100 ml/hr -provides 408 kcal, KCl infusion Labs reviewed: Low K Mg/Phos WNL   NUTRITION - FOCUSED PHYSICAL EXAM:    Most Recent Value  Orbital Region  No depletion  Upper Arm Region  Moderate depletion  Thoracic and Lumbar Region  Unable to assess  Buccal Region  No depletion  Temple Region  Mild depletion   Clavicle Bone Region  Moderate depletion  Clavicle and Acromion Bone Region  Moderate depletion  Scapular Bone Region  Unable to assess  Dorsal Hand  No depletion  Patellar Region  Unable to assess  Anterior Thigh Region  Unable to assess  Posterior Calf Region  Unable to assess  Edema (RD Assessment)  None       Diet Order:   Diet Order            Diet NPO time specified  Diet effective midnight        Diet clear liquid Room service appropriate? Yes; Fluid consistency: Thin  Diet effective now              EDUCATION NEEDS:   Not appropriate for education at this time  Skin:  Skin Assessment: Reviewed RN Assessment  Last BM:  8/30  Height:   Ht Readings from Last 1 Encounters:  07/18/18 5\' 3"  (1.6 m)    Weight:   Wt Readings from Last 1 Encounters:  07/18/18 46.8 kg    Ideal Body Weight:  52.3 kg  BMI:  Body mass index is 18.28 kg/m.  Estimated Nutritional Needs:   Kcal:  1600-1800  Protein:  80-90g  Fluid:  1.8L/day   Clayton Bibles, MS, RD, LDN [place] [facility] Pager: [phone] After Hours Pager: [phone]

## 2018-07-20 ENCOUNTER — Encounter (HOSPITAL_COMMUNITY)
Admission: EM | Disposition: A | Discharge status: Home / Self Care | Payer: Self-pay | Source: Home / Self Care | Attending: Internal Medicine

## 2018-07-20 ENCOUNTER — Inpatient Hospital Stay (HOSPITAL_COMMUNITY): Payer: Self-pay | Admitting: Certified Registered Nurse Anesthetist

## 2018-07-20 ENCOUNTER — Encounter (HOSPITAL_COMMUNITY): Payer: Self-pay | Admitting: Anesthesiology

## 2018-07-20 DIAGNOSIS — R112 Nausea with vomiting, unspecified: Secondary | ICD-10-CM

## 2018-07-20 HISTORY — PX: BIOPSY: SHX5522

## 2018-07-20 HISTORY — PX: COLONOSCOPY WITH PROPOFOL: SHX5780

## 2018-07-20 HISTORY — PX: ESOPHAGOGASTRODUODENOSCOPY (EGD) WITH PROPOFOL: SHX5813

## 2018-07-20 LAB — COMPREHENSIVE METABOLIC PANEL
ALT: 12 U/L (ref 0–44)
AST: 16 U/L (ref 15–41)
Albumin: 1.3 g/dL — ABNORMAL LOW (ref 3.5–5.0)
Alkaline Phosphatase: 81 U/L (ref 38–126)
Anion gap: 7 (ref 5–15)
BUN: <5 mg/dL — ABNORMAL LOW (ref 6–20)
CO2: 24 mmol/L (ref 22–32)
Calcium: 8 mg/dL — ABNORMAL LOW (ref 8.9–10.3)
Chloride: 111 mmol/L (ref 98–111)
Creatinine, Ser: 0.37 mg/dL — ABNORMAL LOW (ref 0.44–1.00)
GFR calc Af Amer: >60 mL/min (ref >60)
GFR calc non Af Amer: >60 mL/min (ref >60)
Glucose, Bld: 77 mg/dL (ref 70–99)
Potassium: 3.9 mmol/L (ref 3.5–5.1)
Sodium: 142 mmol/L (ref 135–145)
Total Bilirubin: 0.3 mg/dL (ref 0.3–1.2)
Total Protein: 4.7 g/dL — ABNORMAL LOW (ref 6.5–8.1)

## 2018-07-20 LAB — TYPE AND SCREEN
ABO/RH(D): O POS
Antibody Screen: NEGATIVE

## 2018-07-20 LAB — CBC WITH DIFFERENTIAL/PLATELET
Basophils Absolute: 0 10*3/uL (ref 0.0–0.1)
Basophils Relative: 0 %
Eosinophils Absolute: 0 10*3/uL (ref 0.0–0.7)
Eosinophils Relative: 0 %
HCT: 24.5 % — ABNORMAL LOW (ref 36.0–46.0)
Hemoglobin: 8.1 g/dL — ABNORMAL LOW (ref 12.0–15.0)
Lymphocytes Relative: 47 %
Lymphs Abs: 2.4 10*3/uL (ref 0.7–4.0)
MCH: 28.3 pg (ref 26.0–34.0)
MCHC: 33.1 g/dL (ref 30.0–36.0)
MCV: 85.7 fL (ref 78.0–100.0)
Monocytes Absolute: 0.7 10*3/uL (ref 0.1–1.0)
Monocytes Relative: 13 %
Neutro Abs: 2 10*3/uL (ref 1.7–7.7)
Neutrophils Relative %: 40 %
Platelets: 453 10*3/uL — ABNORMAL HIGH (ref 150–400)
RBC: 2.86 MIL/uL — ABNORMAL LOW (ref 3.87–5.11)
RDW: 17.1 % — ABNORMAL HIGH (ref 11.5–15.5)
WBC: 5.1 10*3/uL (ref 4.0–10.5)

## 2018-07-20 LAB — ABO/RH: ABO/RH(D): O POS

## 2018-07-20 LAB — PHOSPHORUS: Phosphorus: 3 mg/dL (ref 2.5–4.6)

## 2018-07-20 LAB — MAGNESIUM: Magnesium: 1.6 mg/dL — ABNORMAL LOW (ref 1.7–2.4)

## 2018-07-20 SURGERY — ESOPHAGOGASTRODUODENOSCOPY (EGD) WITH PROPOFOL
Anesthesia: Monitor Anesthesia Care

## 2018-07-20 MED ORDER — MIDAZOLAM HCL 5 MG/5ML IJ SOLN
INTRAMUSCULAR | Status: DC | PRN
  Administered 2018-07-20: 2 mg via INTRAVENOUS

## 2018-07-20 MED ORDER — SODIUM CHLORIDE 0.9 % IV SOLN
INTRAVENOUS | Status: DC | PRN
  Administered 2018-07-20: 12:00:00 via INTRAVENOUS

## 2018-07-20 MED ORDER — MIDAZOLAM HCL 2 MG/2ML IJ SOLN
INTRAMUSCULAR | Status: AC
  Filled 2018-07-20: qty 2

## 2018-07-20 MED ORDER — LACTATED RINGERS IV SOLN: INTRAVENOUS | Status: DC | PRN

## 2018-07-20 MED ORDER — PROPOFOL 500 MG/50ML IV EMUL
INTRAVENOUS | Status: DC | PRN
  Administered 2018-07-20: 175 ug/kg/min via INTRAVENOUS

## 2018-07-20 MED ORDER — PROPOFOL 500 MG/50ML IV EMUL
INTRAVENOUS | Status: DC | PRN
  Administered 2018-07-20: 30 mg via INTRAVENOUS
  Administered 2018-07-20: 20 mg via INTRAVENOUS

## 2018-07-20 MED ORDER — PROPOFOL 10 MG/ML IV BOLUS
INTRAVENOUS | Status: AC
  Filled 2018-07-20: qty 40

## 2018-07-20 MED ORDER — MAGNESIUM SULFATE 2 GM/50ML IV SOLN
2.0000 g | Freq: Once | INTRAVENOUS | Status: AC
  Administered 2018-07-20: 2 g via INTRAVENOUS
  Filled 2018-07-20: qty 50

## 2018-07-20 SURGICAL SUPPLY — 25 items

## 2018-07-20 NOTE — Op Note (Signed)
[facility] Patient Name: Jennifer Cain Procedure Date: 07/20/2018 MRN: [id number] Attending MD: Carol Ada , MD Date of Birth: [birth year] CSN: [id number] Age: 22 Admit Type: Inpatient Procedure:                Colonoscopy Indications:              Chronic diarrhea, Abnormal CT of the GI tract Providers:                Carol Ada, MD, Baird Cancer, RN, Nevin Bloodgood,                            Technician, Arnoldo Hooker, CRNA Referring MD:              Medicines:                Propofol per Anesthesia Complications:            No immediate complications. Estimated Blood Loss:     Estimated blood loss was minimal. Procedure:                Pre-Anesthesia Assessment:                           - Prior to the procedure, a History and Physical                            was performed, and patient medications and                            allergies were reviewed. The patient's tolerance of                            previous anesthesia was also reviewed. The risks                            and benefits of the procedure and the sedation                            options and risks were discussed with the patient.                            All questions were answered, and informed consent                            was obtained. Prior Anticoagulants: The patient has                            taken no previous anticoagulant or antiplatelet                            agents. ASA Grade Assessment: II - A patient with                            mild systemic disease. After reviewing the risks  and benefits, the patient was deemed in                            satisfactory condition to undergo the procedure.                           - Sedation was administered by an anesthesia                            professional. Deep sedation was attained.                           After obtaining informed consent, the colonoscope                            was  passed under direct vision. Throughout the                            procedure, the patient's blood pressure, pulse, and                            oxygen saturations were monitored continuously. The                            [id number]-[id number] ([id number] Olympus peds colonscope was                            introduced through the anus and advanced to the the                            terminal ileum. The colonoscopy was performed                            without difficulty. The patient tolerated the                            procedure well. The quality of the bowel                            preparation was good. The terminal ileum, ileocecal                            valve, appendiceal orifice, and rectum were                            photographed. Scope In: 11:91:47 PM Scope Out: 1:01:41 PM Scope Withdrawal Time: 0 hours 20 minutes 11 seconds  Total Procedure Duration: 0 hours 24 minutes 52 seconds  Findings:      The colon (entire examined portion) appeared normal. Biopsies were taken       with a cold forceps for histology. Impression:               - The entire examined colon is normal. Biopsied. Moderate Sedation:      N/A- Per Anesthesia Care Recommendation:           -  Patient has a contact number available for                            emergencies. The signs and symptoms of potential                            delayed complications were discussed with the                            patient. Return to normal activities tomorrow.                            Written discharge instructions were provided to the                            patient.                           - Resume previous diet.                           - Continue present medications.                           - Await pathology results.                           - Repeat colonoscopy at age 65 for surveillance. Procedure Code(s):        --- Professional ---                           [phone], Colonoscopy, flexible;  with biopsy, single                            or multiple Diagnosis Code(s):        --- Professional ---                           K52.9, Noninfective gastroenteritis and colitis,                            unspecified                           R93.3, Abnormal findings on diagnostic imaging of                            other parts of digestive tract CPT copyright 2017 [employer]. All rights reserved. The codes documented in this report are preliminary and upon coder review may  be revised to meet current compliance requirements. Carol Ada, MD Carol Ada, MD 07/20/2018 1:07:02 PM This report has been signed electronically. Number of Addenda: 0

## 2018-07-20 NOTE — Progress Notes (Signed)
PROGRESS NOTE    Jennifer Stegall  MIW:[id number] DOB: 1996-06-28 DOA: 07/18/2018 PCP: Ladell Pier, MD   Brief Narrative:   HPI per Dr. Wells [name] is a 22 y.o. female with medical history significant of many  months of an unintentional weight loss, abdominal pain, nausea and vomiting.  Patient has had numerous ER visits and she has recently been referred to GI for further work-up.  She is Not currently on any medications.  She has been tried on Protonix, Bentyl and a short stent of steroids a few months ago none of which seem to help her symptoms.  The patient was so weak her mother could not get her out of bed which prompted him to bring her into the ED. Per  Report patient had a elevated transglutaminase level with a normal antigliadin IgA antibody.  sHe has a chronic anemia that is stable. She oes admit to depressed mood but denies any suicidal ideation.  States that she is able to eat however when she eats solids it either results in vomiting or diarrhea.  She states she is able to keep down liquids.   blood work was significant for a albumin of 1.7 .  And hemoglobin of 9.7 .  CT scan showed changes suggestive of enteritis but was similar to prior CT done in April.  ED Course: PA consulted GI,  DR Armbruster who recommended admission for FTT.  They  did not recommend any acute treatment at this time. Dr Posey Pronto accepted for admission.  An IV was unable to be obtained so the patient was given IM Ativan and IM morphine and 1 L IV fluid.  Interval Course: **GI Evaluated and patient to undergo EGD and Colonoscopy this AM.  Dr. Bryan Lemma also recommended checking stool studies including lactoferrin as well as cultures and Giardia.  Per GI if EGD and colonoscopy is unrevealing we will need to consider video capsule endoscopy for small bowel interrogation.  Assessment & Plan:   Principal Problem:   FTT (failure to thrive) in adult Active Problems:   Enteritis-Chronic  Protein calorie malnutrition (HCC)   Chronic diarrhea   Nausea & vomiting-chronic   Normocytic anemia   Generalized weakness   Thrombocytosis (HCC)   Hypokalemia   Malnutrition of moderate degree   Hypomagnesemia  Nausea/Vomiting/Abdominal Pain/Diarrhea and Weight loss in the setting of Acute Enteritis r/o Infectious vs. Inflammatory conditions -Admitted to Med-Surge -Patient states that she has lost approximately 90 pounds since last year and lost about 58 since April -States that she has significant nausea, vomiting, and loose stools every morning -Patient states that eating makes her feel nauseous and vomit -CT scan of the Abdomen and Pelvis showed abnormal circumferential wall thickening about multiple loops of the distal small bowel with in the mid and lower abdomen which was compatible with acute enteritis.  These findings are similar relative to the prior CT from 03/16/2018 there is also enlarged mesenteric adenopathy which is likely reactive and similar to previous -Was going to obtain a GI pathogen panel to rule out infectious etiologies but after discussion with Gastroenterology will not do so as they feel this is not infectious given no fever or white count however they have not changed her mind and stool studies have been checked including fecal lactoferrin stool cultures and Giardia -Patient CRP was 1.8 and ESR was 30 -Given a banana bag yesterday and will repeat today at 100 and mL/hr and continue so far because of her micronutrient deficiency.  Patient  is also high risk for refeeding syndrome when tolerating p.o. we will continue to monitor electrolytes closely -Gastroenterology consulted for further evaluation recommendations and are planning to take the patient for EGD and colonoscopy sometime this a.m. patient was placed on clear liquids yesterday and was n.p.o. at midnight. -Patient kept losing IV access yesterday so PICC line was ordered however she is to provide IVs and also  PICC line will be canceled  Generalized weakness/Failure to Thrive secondary to above -Continue with IV fluid hydration with normal saline and banana bag at 100 and mL's per hour for now -Nutrition is consulted -PT to evaluate and treat  Normocytic Anemia However patient was therefore 6.8 -Hemoglobin/hematocrit was 9.7/30.3 today similar to previous today hemoglobin/hematocrit was 8.1/24.5 and likely dilutional drop -Had an anemia panel done on 427/2019 which showed an iron level 34, U IBC of 68, TIBC 102, saturation ratios of 33%, ferritin level 212, folate level 7.4, and a Vitamin B12 level of 849 -Continue to monitor for signs and symptoms of bleeding -Repeat CBC in the a.m. -Follow-up on GI EGD and colonoscopy findings  Thrombocytosis -Likely Reactive -Patient had a platelet count 514 on admission yesterday platelet count was 571.  Now trended down to 453 -Continue to monitor and Trend -Repeat CBC in AM   Moderate protein Calorie Malnutrition -Patient's albumin level was 1.8 and prealbumin was less than 5; Albumin level today is 1.3 -We will advance diet after colonoscopy and EGD -We will need to start Ensure Enlive p.o. twice daily and daily multivitamin after she has stopped receiving a banana bag -Nutrition is consulted for further evaluation recommendations  Hypo-kalemia -Patient potassium was 3.2 and improved to 3.9 -Replete with IV potassium chloride 40 mEq x1 along with p.o. potassium chloride 40 mEq x1 yesterday -Continue monitor and replete as necessary -Repeat CMP in a.m.  Hypomagnesemia -Patient magnesium levels 1.6 -Replete with IV mag sulfate 2 g -Continue monitor and replete as necessary -Repeat magnesium level in a.m.  DVT prophylaxis: Lovenox 30 mg sq q24h (held for EGD and Colon tomorrow)  Code Status: FULL CODE Family Communication: No family present at bedside  Disposition Plan: Anticipate D/C in the next 24-48 hours if medically stable  Consultants:   [facility]   Procedures: EGD and Colonoscopy scheduled for AM  Antimicrobials:  Antibiotics Given (last 72 hours)    None     Subjective: Seen and examined at bedside states her nausea vomiting is improved.  States she feels a little bit better however she still continues to have abdominal pain and some loose watery diarrhea.  Denying chest pain, shortness breath, lightheadedness or dizziness.  Still feels fatigued somewhat but better after being fluid hydrated.  Objective: Vitals:   07/18/18 2028 07/19/18 0613 07/19/18 2245 07/20/18 0521  BP: (!) 132/96 121/81 125/84 103/70  Pulse: (!) 105 99 (!) 101 94  Resp: _0 Temp: 99.1 F (37.3 C) 98.8 F (37.1 C) 99.6 F (37.6 C) 98.6 F (37 C)  TempSrc: Oral Oral Oral Oral  SpO2: 100% 100% 100% 100%  Weight:      Height:        Intake/Output Summary (Last 24 hours) at 07/20/2018 1141 Last data filed at 07/20/2018 0800 Gross per 24 hour  Intake 0 ml  Output -  Net 0 ml   Filed Weights   07/18/18 1206 07/18/18 2016  Weight: 45.1 kg 46.8 kg   Examination: Physical Exam:  Constitutional: Thin African-American female who is currently  no acute distress appears calm and comfortable awaiting colonoscopy and EGD today Eyes: Sclera anicteric.  Lids and conjunctive are normal ENMT: External ears and nose appear normal.  Grossly normal hearing Neck: Neck is supple no appreciable JVD Respiratory: Diminished to auscultation bilaterally no appreciable wheezing, rales, rhonchi.  Patient was not tachypneic wheezing excess muscle breathe Cardiovascular: Patient has regular rate and rhythm however site on the faster side.  No appreciable murmurs, rubs or gallops.  No lower extremity edema noted Abdomen: Soft, slightly tender to palpate in the mid abdomen, nondistended bowel sounds present all 4 quadrants GU: Deferred Musculoskeletal: No contractures cyanosis.  No joint deformity noted Skin: Skin is warm and dry no  appreciable rashes or lesions limited skin evaluation Neurologic: Cranial nerves II through XII grossly intact no appreciable focal deficits.  Romberg sign and cerebellar reflexes were not assessed Psychiatric: Normal mood and affect.  Intact judgment intact.  Patient is awake, alert, and oriented x3  Data Reviewed: I have personally reviewed following labs and imaging studies  CBC: Recent Labs  Lab 07/18/18 1455 07/18/18 2233 07/19/18 0929 07/20/18 0459  WBC 7.3 5.3 5.3 5.1  NEUTROABS 2.3  --  1.8 2.0  HGB 9.7* 9.4* 9.7* 8.1*  HCT 29.4* 28.3* 30.3* 24.5*  MCV 85.2 86.3 86.8 85.7  PLT 539* 514* 571* 032*   Basic Metabolic Panel: Recent Labs  Lab 07/18/18 1455 07/18/18 2233 07/19/18 0929 07/20/18 0459  NA 141  --  138 142  K 4.0  --  3.2* 3.9  CL 107  --  104 111  CO2 27  --  23 24  GLUCOSE 69*  --  66* 77  BUN 7  --  5* <5*  CREATININE 0.45 0.51 0.38* 0.37*  CALCIUM 7.8*  --  8.1* 8.0*  MG  --   --  1.8 1.6*  PHOS  --   --  2.8 3.0   GFR: Estimated Creatinine Clearance: 81.5 mL/min (A) (by C-G formula based on SCr of 0.37 mg/dL (L)). Liver Function Tests: Recent Labs  Lab 07/18/18 1455 07/19/18 0929 07/20/18 0459  AST _0 ALT _1 ALKPHOS 104 115 81  BILITOT 0.5 0.3 0.3  PROT 5.8* 6.0* 4.7*  ALBUMIN 1.7* 1.8* 1.3*   No results for input(s): LIPASE, AMYLASE in the last 168 hours. No results for input(s): AMMONIA in the last 168 hours. Coagulation Profile: No results for input(s): INR, PROTIME in the last 168 hours. Cardiac Enzymes: No results for input(s): CKTOTAL, CKMB, CKMBINDEX, TROPONINI in the last 168 hours. BNP (last 3 results) No results for input(s): PROBNP in the last 8760 hours. HbA1C: Recent Labs    07/19/18 0929  HGBA1C 4.0*   CBG: No results for input(s): GLUCAP in the last 168 hours. Lipid Profile: No results for input(s): CHOL, HDL, LDLCALC, TRIG, CHOLHDL, LDLDIRECT in the last 72 hours. Thyroid Function Tests: No  results for input(s): TSH, T4TOTAL, FREET4, T3FREE, THYROIDAB in the last 72 hours. Anemia Panel: No results for input(s): VITAMINB12, FOLATE, FERRITIN, TIBC, IRON, RETICCTPCT in the last 72 hours. Sepsis Labs: No results for input(s): PROCALCITON, LATICACIDVEN in the last 168 hours.  No results found for this or any previous visit (from the past 240 hour(s)).   Radiology Studies: Ct Abdomen Pelvis Wo Contrast  Result Date: 07/18/2018 CLINICAL DATA:  Initial evaluation for unintended weight loss, generalized abdominal pain. EXAM: CT CHEST, ABDOMEN AND PELVIS WITHOUT CONTRAST TECHNIQUE: Multidetector CT imaging of the chest, abdomen  and pelvis was performed following the standard protocol without IV contrast. COMPARISON:  Prior CT from 03/16/2018. FINDINGS: CT CHEST FINDINGS Cardiovascular: Limited noncontrast evaluation of the intrathoracic aorta are grossly unremarkable. No appreciable aneurysm. Partially visualized great vessels grossly within normal limits. Heart size normal. Decreased density within the cardiac blood pool, suggesting anemia. No pericardial effusion. Mediastinum/Nodes: Visualized thyroid normal. No enlarged mediastinal, hilar, or axillary lymph nodes identified on this noncontrast examination. No mediastinal mass. Limited noncontrast evaluation of the esophagus grossly unremarkable. Lungs/Pleura: Tracheobronchial tree widely patent and intact. Lungs well inflated bilaterally. Lungs are clear without focal infiltrate, pulmonary edema, or pleural effusion. No pneumothorax. No worrisome pulmonary nodule or mass. Musculoskeletal: External soft tissues demonstrate no acute finding. No acute osseous abnormality. No worrisome lytic or blastic osseous lesions. Mild S-shaped thoracolumbar scoliosis noted. CT ABDOMEN PELVIS FINDINGS Hepatobiliary: Mild diffuse hypoattenuation liver suggestive of steatosis. Limited noncontrast evaluation of the liver otherwise unremarkable. Gallbladder within  normal limits. No biliary dilatation. Pancreas: Pancreas within normal limits. Spleen: Spleen within normal limits. Adrenals/Urinary Tract: Adrenal glands grossly unremarkable. Kidneys equal in size without evidence for nephrolithiasis or hydronephrosis. No appreciable hydroureter. Bladder moderately distended without acute abnormality. Stomach/Bowel: Stomach within normal limits. No evidence for bowel obstruction. Abnormal wall thickening seen about multiple loops of distal bowel, likely ileum, clustered within the mid and right lower abdomen. Findings consistent with an acute enteritis, which could be either infectious or inflammatory in nature. Finding is similar and may be slightly more pronounced as compared to prior CT. Appendix within normal limits. No other acute inflammatory changes about the bowels.: Grossly within normal limits. Vascular/Lymphatic: Intra-abdominal aorta of normal caliber. Again seen are multiple enlarged mesenteric lymph nodes, largest of which measures approximately 16 mm (series 2, image 80). Findings are indeterminate, but could be reactive. No other appreciable adenopathy on this noncontrast examination. Reproductive: Uterus and ovaries within normal limits. Other: No free air or fluid. Musculoskeletal: No acute osseous abnormality. No worrisome lytic or blastic osseous lesions. IMPRESSION: 1. Abnormal circumferential wall thickening about multiple loops of distal small bowel within the mid and lower abdomen, compatible with acute enteritis. Findings are similar relative to prior CT from 03/16/2018, and again could be either infectious or inflammatory in nature. Findings are relatively similar as compared to prior CT from 03/16/2018. 2. Enlarged mesenteric adenopathy, likely reactive. Findings also similar to previous. 3. Hepatic steatosis. Electronically Signed   By: Jeannine Boga M.D.   On: 07/18/2018 16:36   Ct Head Wo Contrast  Result Date: 07/18/2018 CLINICAL DATA:   Abdominal pain, unintended weight loss. EXAM: CT HEAD WITHOUT CONTRAST TECHNIQUE: Contiguous axial images were obtained from the base of the skull through the vertex without intravenous contrast. COMPARISON:  None. FINDINGS: BRAIN: No intraparenchymal hemorrhage, mass effect nor midline shift. Mild parenchymal brain volume loss. No hydrocephalus. No acute large vascular territory infarcts. No abnormal extra-axial fluid collections. Basal cisterns are patent. Empty sella. VASCULAR: Unremarkable. SKULL/SOFT TISSUES: No skull fracture. Persistent metopic suture. No significant soft tissue swelling. ORBITS/SINUSES: The included ocular globes and orbital contents are normal.Trace paranasal sinus mucosal thickening. Mastoid air cells are well aerated. OTHER: None. IMPRESSION: 1. No acute intracranial process. 2. Mild parenchymal brain volume loss. 3. Empty sella. Electronically Signed   By: Elon Alas M.D.   On: 07/18/2018 16:08   Ct Chest Wo Contrast  Result Date: 07/18/2018 CLINICAL DATA:  Initial evaluation for unintended weight loss, generalized abdominal pain. EXAM: CT CHEST, ABDOMEN AND PELVIS WITHOUT CONTRAST TECHNIQUE:  Multidetector CT imaging of the chest, abdomen and pelvis was performed following the standard protocol without IV contrast. COMPARISON:  Prior CT from 03/16/2018. FINDINGS: CT CHEST FINDINGS Cardiovascular: Limited noncontrast evaluation of the intrathoracic aorta are grossly unremarkable. No appreciable aneurysm. Partially visualized great vessels grossly within normal limits. Heart size normal. Decreased density within the cardiac blood pool, suggesting anemia. No pericardial effusion. Mediastinum/Nodes: Visualized thyroid normal. No enlarged mediastinal, hilar, or axillary lymph nodes identified on this noncontrast examination. No mediastinal mass. Limited noncontrast evaluation of the esophagus grossly unremarkable. Lungs/Pleura: Tracheobronchial tree widely patent and intact. Lungs  well inflated bilaterally. Lungs are clear without focal infiltrate, pulmonary edema, or pleural effusion. No pneumothorax. No worrisome pulmonary nodule or mass. Musculoskeletal: External soft tissues demonstrate no acute finding. No acute osseous abnormality. No worrisome lytic or blastic osseous lesions. Mild S-shaped thoracolumbar scoliosis noted. CT ABDOMEN PELVIS FINDINGS Hepatobiliary: Mild diffuse hypoattenuation liver suggestive of steatosis. Limited noncontrast evaluation of the liver otherwise unremarkable. Gallbladder within normal limits. No biliary dilatation. Pancreas: Pancreas within normal limits. Spleen: Spleen within normal limits. Adrenals/Urinary Tract: Adrenal glands grossly unremarkable. Kidneys equal in size without evidence for nephrolithiasis or hydronephrosis. No appreciable hydroureter. Bladder moderately distended without acute abnormality. Stomach/Bowel: Stomach within normal limits. No evidence for bowel obstruction. Abnormal wall thickening seen about multiple loops of distal bowel, likely ileum, clustered within the mid and right lower abdomen. Findings consistent with an acute enteritis, which could be either infectious or inflammatory in nature. Finding is similar and may be slightly more pronounced as compared to prior CT. Appendix within normal limits. No other acute inflammatory changes about the bowels.: Grossly within normal limits. Vascular/Lymphatic: Intra-abdominal aorta of normal caliber. Again seen are multiple enlarged mesenteric lymph nodes, largest of which measures approximately 16 mm (series 2, image 80). Findings are indeterminate, but could be reactive. No other appreciable adenopathy on this noncontrast examination. Reproductive: Uterus and ovaries within normal limits. Other: No free air or fluid. Musculoskeletal: No acute osseous abnormality. No worrisome lytic or blastic osseous lesions. IMPRESSION: 1. Abnormal circumferential wall thickening about multiple  loops of distal small bowel within the mid and lower abdomen, compatible with acute enteritis. Findings are similar relative to prior CT from 03/16/2018, and again could be either infectious or inflammatory in nature. Findings are relatively similar as compared to prior CT from 03/16/2018. 2. Enlarged mesenteric adenopathy, likely reactive. Findings also similar to previous. 3. Hepatic steatosis. Electronically Signed   By: Jeannine Boga M.D.   On: 07/18/2018 16:36   Dg Femur Min 2 Views Left  Result Date: 07/18/2018 CLINICAL DATA:  Bilateral lower extremity bone pain for months without known injury. EXAM: LEFT FEMUR 2 VIEWS COMPARISON:  Same day CT abdomen and pelvis FINDINGS: Overlapping AP and frog-leg views of the left femur. Joint spaces appear intact. Femoral head is spherical in appearance and maintained within the acetabular component. There is no evidence of fracture or other focal bone lesions. Enteric contrast within overlying bowel loops simulate sclerotic lesions of the sacrum. Soft tissues are unremarkable. IMPRESSION: No acute osseous abnormality. Electronically Signed   By: Ashley Royalty M.D.   On: 07/18/2018 16:18   Dg Femur Min 2 Views Right  Result Date: 07/18/2018 CLINICAL DATA:  Bilateral lower extremity bone pain for months. No known injury. EXAM: RIGHT FEMUR 2 VIEWS COMPARISON:  Same day CT AP FINDINGS: Overlapping AP and frog-leg views of the right femur. Intact joint spaces. No fracture or suspicious osseous lesions. Soft tissues are  unremarkable. Contrast within bowel loops project over the lower pelvis and sacrum accounting for the areas of hazy increased opacity. No sacral lesion is identified on same day CT. IMPRESSION: No acute osseous abnormality. Electronically Signed   By: Ashley Royalty M.D.   On: 07/18/2018 16:15   [place] Ekg Site Rite  Result Date: 07/19/2018 If Site Rite image not attached, placement could not be confirmed due to current cardiac rhythm.  Scheduled  Meds:  Continuous Infusions: . banana bag IV 1000 mL 100 mL/hr at 07/20/18 0425    LOS: 1 day   Kerney Elbe, DO [facility] PAGER is on [place]  If 7PM-7AM, please contact night-coverage [email or web address] Password [facility] 07/20/2018, 11:41 AM

## 2018-07-20 NOTE — Progress Notes (Signed)
Spoke with Jenny Reichmann RN and Raynelle Fanning re PICC placement today. Pt being prepped for colonoscopy this am, will hold PICC placement until after.  Lattie Haw RN to obtain consent prior to Colonoscopy and Jenny Reichmann RN to place IV team consult once pt returns from procedure.  Current plan to place PICC this afternoon.  Pt has adequate PIV access at this time.

## 2018-07-20 NOTE — Anesthesia Preprocedure Evaluation (Signed)
Anesthesia Evaluation  Patient identified by MRN, date of birth, ID band Patient awake    Reviewed: Allergy & Precautions, NPO status , Patient's Chart, lab work & pertinent test results  Airway Mallampati: II  TM Distance: >3 FB Neck ROM: Full    Dental  (+) Dental Advisory Given   Pulmonary neg pulmonary ROS,    breath sounds clear to auscultation       Cardiovascular negative cardio ROS   Rhythm:Regular Rate:Normal     Neuro/Psych negative neurological ROS     GI/Hepatic Neg liver ROS, N/v/ diarrhea and subsequent malnutrition. Abnormal CT scan   Endo/Other  negative endocrine ROS  Renal/GU negative Renal ROS     Musculoskeletal   Abdominal   Peds  Hematology  (+) anemia ,   Anesthesia Other Findings   Reproductive/Obstetrics                             Lab Results  Component Value Date   WBC 5.1 07/20/2018   HGB 8.1 (L) 07/20/2018   HCT 24.5 (L) 07/20/2018   MCV 85.7 07/20/2018   PLT 453 (H) 07/20/2018   Lab Results  Component Value Date   CREATININE 0.37 (L) 07/20/2018   BUN <5 (L) 07/20/2018   NA 142 07/20/2018   K 3.9 07/20/2018   CL 111 07/20/2018   CO2 24 07/20/2018    Anesthesia Physical Anesthesia Plan  ASA: III  Anesthesia Plan: MAC   Post-op Pain Management:    Induction: Intravenous  PONV Risk Score and Plan: 2 and Propofol infusion, Ondansetron and Treatment may vary due to age or medical condition  Airway Management Planned: Natural Airway and Nasal Cannula  Additional Equipment:   Intra-op Plan:   Post-operative Plan:   Informed Consent: I have reviewed the patients History and Physical, chart, labs and discussed the procedure including the risks, benefits and alternatives for the proposed anesthesia with the patient or authorized representative who has indicated his/her understanding and acceptance.     Plan Discussed with:  CRNA  Anesthesia Plan Comments:         Anesthesia Quick Evaluation

## 2018-07-20 NOTE — Op Note (Signed)
[facility] Patient Name: Jennifer Cain Procedure Date: 07/20/2018 MRN: [id number] Attending MD: Carol Ada , MD Date of Birth: 01-17-1996 CSN: [id number] Age: 22 Admit Type: Inpatient Procedure:                Upper GI endoscopy Indications:              Diarrhea, Nausea with vomiting Providers:                Carol Ada, MD, Baird Cancer, RN, Nevin Bloodgood,                            Technician, Arnoldo Hooker, CRNA Referring MD:              Medicines:                Propofol per Anesthesia Complications:            No immediate complications. Estimated Blood Loss:     Estimated blood loss was minimal. Procedure:                Pre-Anesthesia Assessment:                           - Prior to the procedure, a History and Physical                            was performed, and patient medications and                            allergies were reviewed. The patient's tolerance of                            previous anesthesia was also reviewed. The risks                            and benefits of the procedure and the sedation                            options and risks were discussed with the patient.                            All questions were answered, and informed consent                            was obtained. Prior Anticoagulants: The patient has                            taken no previous anticoagulant or antiplatelet                            agents. ASA Grade Assessment: II - A patient with                            mild systemic disease. After reviewing the risks  and benefits, the patient was deemed in                            satisfactory condition to undergo the procedure.                           - Sedation was administered by an anesthesia                            professional. Deep sedation was attained.                           After obtaining informed consent, the endoscope was                            passed under  direct vision. Throughout the                            procedure, the patient's blood pressure, pulse, and                            oxygen saturations were monitored continuously. The                            [id number]-[id number] ([id number] Olympus peds colonscope was                            introduced through the mouth, and advanced to the                            third part of duodenum. The upper GI endoscopy was                            accomplished without difficulty. The patient                            tolerated the procedure well. Scope In: Scope Out: Findings:      The esophagus was normal.      The stomach was normal.      The examined duodenum was normal. Biopsies were taken with a cold       forceps for histology. Impression:               - Normal esophagus.                           - Normal stomach.                           - Normal examined duodenum. Biopsied. Moderate Sedation:      N/A- Per Anesthesia Care Recommendation:           - Return patient to hospital ward for ongoing care.                           - Resume regular diet.                           -  Continue present medications.                           - Await pathology results. Procedure Code(s):        --- Professional ---                           [phone], Esophagogastroduodenoscopy, flexible,                            transoral; with biopsy, single or multiple Diagnosis Code(s):        --- Professional ---                           R19.7, Diarrhea, unspecified                           R11.2, Nausea with vomiting, unspecified CPT copyright 2017 American Medical Association. All rights reserved. The codes documented in this report are preliminary and upon coder review may  be revised to meet current compliance requirements. Carol Ada, MD Carol Ada, MD 07/20/2018 1:08:52 PM This report has been signed electronically. Number of Addenda: 0

## 2018-07-20 NOTE — Interval H&P Note (Signed)
History and Physical Interval Note:  07/20/2018 12:17 PM  Jennifer Cain  has presented today for surgery, with the diagnosis of nausea, vomiting, abd pain, abnormal imaging of small bowel, weight loss  The various methods of treatment have been discussed with the patient and family. After consideration of risks, benefits and other options for treatment, the patient has consented to  Procedure(s): ESOPHAGOGASTRODUODENOSCOPY (EGD) WITH PROPOFOL (N/A) COLONOSCOPY WITH PROPOFOL (N/A) as a surgical intervention .  The patient's history has been reviewed, patient examined, no change in status, stable for surgery.  I have reviewed the patient's chart and labs.  Questions were answered to the patient's satisfaction.     Karolee Meloni D

## 2018-07-20 NOTE — Anesthesia Postprocedure Evaluation (Signed)
Anesthesia Post Note  Patient: Jennifer Cain  Procedure(s) Performed: ESOPHAGOGASTRODUODENOSCOPY (EGD) WITH PROPOFOL (N/A ) COLONOSCOPY WITH PROPOFOL (N/A ) BIOPSY     Patient location during evaluation: PACU Anesthesia Type: MAC Level of consciousness: awake and alert Pain management: pain level controlled Vital Signs Assessment: post-procedure vital signs reviewed and stable Respiratory status: spontaneous breathing, nonlabored ventilation, respiratory function stable and patient connected to nasal cannula oxygen Cardiovascular status: stable and blood pressure returned to baseline Postop Assessment: no apparent nausea or vomiting Anesthetic complications: no    Last Vitals:  Vitals:   07/20/18 1315 07/20/18 1322  BP: 119/79 (!) 129/98  Pulse:  83  Resp: (!) 29 20  Temp:    SpO2: 99% 99%    Last Pain:  Vitals:   07/20/18 1322  TempSrc:   PainSc: 0-No pain                 Tiajuana Amass

## 2018-07-20 NOTE — Transfer of Care (Signed)
Immediate Anesthesia Transfer of Care Note  Patient: [place]  Procedure(s) Performed: Procedure(s): ESOPHAGOGASTRODUODENOSCOPY (EGD) WITH PROPOFOL (N/A) COLONOSCOPY WITH PROPOFOL (N/A) BIOPSY  Patient Location: Endoscopy Unit  Anesthesia Type:MAC  Level of Consciousness:  sedated, patient cooperative and responds to stimulation  Airway & Oxygen Therapy:Patient Spontanous Breathing and Patient connected to face mask oxgen  Post-op Assessment:  Report given to PACU RN and Post -op Vital signs reviewed and stable  Post vital signs:  Reviewed and stable  Last Vitals:  Vitals:   07/20/18 0521 07/20/18 1220  BP: 103/70 (!) 140/92  Pulse: 94   Resp: 16 15  Temp: 37 C 36.9 C  SpO2: 940% 768%    Complications: No apparent anesthesia complications

## 2018-07-21 LAB — GASTROINTESTINAL PANEL BY PCR, STOOL (REPLACES STOOL CULTURE)

## 2018-07-21 LAB — CBC WITH DIFFERENTIAL/PLATELET
Basophils Absolute: 0 10*3/uL (ref 0.0–0.1)
Basophils Relative: 0 %
Eosinophils Absolute: 0 10*3/uL (ref 0.0–0.7)
Eosinophils Relative: 1 %
HCT: 23.5 % — ABNORMAL LOW (ref 36.0–46.0)
Hemoglobin: 7.6 g/dL — ABNORMAL LOW (ref 12.0–15.0)
Lymphocytes Relative: 40 %
Lymphs Abs: 1.7 10*3/uL (ref 0.7–4.0)
MCH: 27.8 pg (ref 26.0–34.0)
MCHC: 32.3 g/dL (ref 30.0–36.0)
MCV: 86.1 fL (ref 78.0–100.0)
Monocytes Absolute: 0.5 10*3/uL (ref 0.1–1.0)
Monocytes Relative: 11 %
Neutro Abs: 2 10*3/uL (ref 1.7–7.7)
Neutrophils Relative %: 48 %
Platelets: 482 10*3/uL — ABNORMAL HIGH (ref 150–400)
RBC: 2.73 MIL/uL — ABNORMAL LOW (ref 3.87–5.11)
RDW: 17.2 % — ABNORMAL HIGH (ref 11.5–15.5)
WBC: 4.2 10*3/uL (ref 4.0–10.5)

## 2018-07-21 LAB — COMPREHENSIVE METABOLIC PANEL
ALT: 13 U/L (ref 0–44)
AST: 19 U/L (ref 15–41)
Albumin: 1.2 g/dL — ABNORMAL LOW (ref 3.5–5.0)
Alkaline Phosphatase: 101 U/L (ref 38–126)
Anion gap: 6 (ref 5–15)
BUN: 6 mg/dL (ref 6–20)
CO2: 24 mmol/L (ref 22–32)
Calcium: 7.6 mg/dL — ABNORMAL LOW (ref 8.9–10.3)
Chloride: 113 mmol/L — ABNORMAL HIGH (ref 98–111)
Creatinine, Ser: 0.5 mg/dL (ref 0.44–1.00)
GFR calc Af Amer: >60 mL/min (ref >60)
GFR calc non Af Amer: >60 mL/min (ref >60)
Glucose, Bld: 95 mg/dL (ref 70–99)
Potassium: 3.5 mmol/L (ref 3.5–5.1)
Sodium: 143 mmol/L (ref 135–145)
Total Bilirubin: <0.1 mg/dL — ABNORMAL LOW (ref 0.3–1.2)
Total Protein: 4.6 g/dL — ABNORMAL LOW (ref 6.5–8.1)

## 2018-07-21 LAB — MAGNESIUM: Magnesium: 1.8 mg/dL (ref 1.7–2.4)

## 2018-07-21 LAB — PHOSPHORUS: Phosphorus: 3.6 mg/dL (ref 2.5–4.6)

## 2018-07-21 MED ORDER — THIAMINE HCL 100 MG/ML IJ SOLN
INTRAVENOUS | Status: DC
  Administered 2018-07-22 – 2018-07-23 (×2): via INTRAVENOUS
  Filled 2018-07-21 (×2): qty 1000

## 2018-07-21 MED ORDER — DEXTROSE-NACL 5-0.9 % IV SOLN
INTRAVENOUS | Status: DC
  Administered 2018-07-21: 17:00:00 via INTRAVENOUS

## 2018-07-21 MED ORDER — METHYLPREDNISOLONE SODIUM SUCC 40 MG IJ SOLR
40.0000 mg | Freq: Two times a day (BID) | INTRAMUSCULAR | Status: DC
  Administered 2018-07-21 – 2018-07-23 (×6): 40 mg via INTRAVENOUS
  Filled 2018-07-21 (×6): qty 1

## 2018-07-21 MED ORDER — ENSURE ENLIVE PO LIQD
237.0000 mL | Freq: Two times a day (BID) | ORAL | Status: DC
  Administered 2018-07-21 – 2018-07-24 (×3): 237 mL via ORAL

## 2018-07-21 NOTE — Progress Notes (Signed)
Subjective: Abdominal cramping and diarrhea at 1:30 AM.  Pain resolved once the diarrhea resolved.  This was secondary to eating a hamburger and fries earlier in the day.  Objective: Vital signs in last 24 hours: Temp:  [97.9 F (36.6 C)-98.8 F (37.1 C)] 98.8 F (37.1 C) (09/01 0528) Pulse Rate:  [72-101] 101 (09/01 0528) Resp:  [15-29] 16 (09/01 0528) BP: (101-140)/(70-98) 125/70 (09/01 0528) SpO2:  [93 %-100 %] 99 % (09/01 0528) Last BM Date: 07/20/18  Intake/Output from previous day: 08/31 0701 - 09/01 0700 In: 3433.6 [P.O.:240; I.V.:3193.6] Out: 2 [Urine:2] Intake/Output this shift: No intake/output data recorded.  General appearance: alert and no distress GI: soft, non-tender; bowel sounds normal; no masses,  no organomegaly  Lab Results: Recent Labs    07/19/18 0929 07/20/18 0459 07/21/18 0518  WBC 5.3 5.1 4.2  HGB 9.7* 8.1* 7.6*  HCT 30.3* 24.5* 23.5*  PLT 571* 453* 482*   BMET Recent Labs    07/19/18 0929 07/20/18 0459 07/21/18 0518  NA 138 142 143  K 3.2* 3.9 3.5  CL 104 111 113*  CO2 23 24 24   GLUCOSE 66* 77 95  BUN 5* <5* 6  CREATININE 0.38* 0.37* 0.50  CALCIUM 8.1* 8.0* 7.6*   LFT Recent Labs    07/21/18 0518  PROT 4.6*  ALBUMIN 1.2*  AST 19  ALT 13  ALKPHOS 101  BILITOT <0.1*   PT/INR No results for input(s): LABPROT, INR in the last 72 hours. Hepatitis Panel No results for input(s): HEPBSAG, HCVAB, HEPAIGM, HEPBIGM in the last 72 hours. C-Diff No results for input(s): CDIFFTOX in the last 72 hours. Fecal Lactopherrin No results for input(s): FECLLACTOFRN in the last 72 hours.  Studies/Results: [place] [employer]  Result Date: 07/19/2018 If [employer] not attached, placement could not be confirmed due to current cardiac rhythm.   Medications:  Scheduled:  Continuous: . [START ON 07/22/2018] banana bag IV 1000 mL    . dextrose 5 % and 0.9% NaCl      Assessment/Plan: 1) Small bowel inflammation. 2) Diarrhea.   She  is clinically stable and the EGD/Colonoscopy were negative for any overt inflammation.  The TI was not able to be deeply intubated.  No erosions or overt inflammation identified, but there was the possibility of some pus.  This was a subjective observation.  Plan: 1) Trial of Solumedrol 40 mg IV q12 hours.  LOS: 2 days   Rhythm Gubbels D 07/21/2018, 8:52 AM

## 2018-07-21 NOTE — Progress Notes (Signed)
PROGRESS NOTE    Jennifer Cain  BOF:[id number] DOB: 03-13-1996 DOA: 07/18/2018 PCP: Ladell Pier, MD   Brief Narrative:   HPI per Dr. Wells [name] is a 22 y.o. female with medical history significant of many  months of an unintentional weight loss, abdominal pain, nausea and vomiting.  Patient has had numerous ER visits and she has recently been referred to GI for further work-up.  She is Not currently on any medications.  She has been tried on Protonix, Bentyl and a short stent of steroids a few months ago none of which seem to help her symptoms.  The patient was so weak her mother could not get her out of bed which prompted him to bring her into the ED. Per  Report patient had a elevated transglutaminase level with a normal antigliadin IgA antibody.  sHe has a chronic anemia that is stable. She oes admit to depressed mood but denies any suicidal ideation.  States that she is able to eat however when she eats solids it either results in vomiting or diarrhea.  She states she is able to keep down liquids.   blood work was significant for a albumin of 1.7 .  And hemoglobin of 9.7 .  CT scan showed changes suggestive of enteritis but was similar to prior CT done in April.  ED Course: PA consulted GI,  DR Armbruster who recommended admission for FTT.  They  did not recommend any acute treatment at this time. Dr Posey Pronto accepted for admission.  An IV was unable to be obtained so the patient was given IM Ativan and IM morphine and 1 L IV fluid.  Interval Course: **GI Evaluated and patient to underwent EGD and Colonoscopy yesterday AM.  Dr. Bryan Lemma had also recommended checking stool studies including lactoferrin as well as cultures and Giardia.  Per GI if EGD and colonoscopy is unrevealing we will need to consider video capsule endoscopy for small bowel interrogation but Dr. Benson Norway feels as if this may be Crohn's Disease so has started the patient on IV Solumedrol. GI Pathogen Panel  is still pending and EGD and Colonoscopy were negative for inflammation but the Terminal Ileum was not abl to examined.   Assessment & Plan:   Principal Problem:   FTT (failure to thrive) in adult Active Problems:   Enteritis-Chronic   Protein calorie malnutrition (HCC)   Chronic diarrhea   Nausea & vomiting-chronic   Normocytic anemia   Generalized weakness   Thrombocytosis (HCC)   Hypokalemia   Malnutrition of moderate degree   Hypomagnesemia  Nausea/Vomiting/Abdominal Pain/Diarrhea and Weight loss in the setting of Chronic Enteritis r/o Infectious vs. Inflammatory conditions; GI Suspecting Crohn's Disease  -Admitted to Med-[facility] -Patient states that she has lost approximately 90 pounds since last year and lost about 63 since April -States that she has significant nausea, vomiting, and loose stools every morning -Patient states that eating makes her feel nauseous and vomit -CT scan of the Abdomen and Pelvis showed abnormal circumferential wall thickening about multiple loops of the distal small bowel with in the mid and lower abdomen which was compatible with acute enteritis.  These findings are similar relative to the prior CT from 03/16/2018 there is also enlarged mesenteric adenopathy which is likely reactive and similar to previous -Stool studies have been checked including fecal lactoferrin stool cultures and Giardia; Enteric Precautions for now until stool studies result  -Patient CRP was 1.8 and ESR was 30 -C/w Banana Bag Daily for micronutrient deficiency  ad with D5W NS at 100 mL/hr when Banana Bag is not running.  Patient is also high risk for refeeding syndrome when tolerating p.o. we will continue to monitor electrolytes closely -Gastroenterology consulted for further evaluation recommendations and took the patient for EGD and colonoscopy; Biopsies were taken -EGD showed Normal esophagus. Normal stomach. Normal examined duodenum. Biopsied. -Colonoscopy showed The colon  (entire examined portion) appeared normal. Biopsies were taken with a cold forceps for histology -GI starting patient on a trial of Solumedrol 40 mg IV q12h for suspected Crohn's Disease   Generalized weakness/Failure to Thrive secondary to above -Continue with IV fluid hydration with D5W NS at 100 mL/hr and Banana Bag Daily  -Nutrition is consulted and appreciate further evaluation and recommendations  -PT to evaluate and treat  Normocytic Anemia -Hemoglobin/hematocrit trending down and was 9.7/30.3 -> 8.1/24.5 -> 7.6/23.5 and likely dilutional drop from IVF as above  -Had an anemia panel done on 427/2019 which showed an iron level 34, U IBC of 68, TIBC 102, saturation ratios of 33%, ferritin level 212, folate level 7.4, and a Vitamin B12 level of 849 -Continue to monitor for signs and symptoms of bleeding -Repeat CBC in the a.m. -Follow-up on GI EGD and colonoscopy findings  Thrombocytosis -Likely Reactive -Patient had a platelet count 514 on admission yesterday platelet count was 571.  Trended down to 453 but had a slight uptick to 482 -Continue to monitor and Trend -Repeat CBC in AM   Moderate protein Calorie Malnutrition -Patient's albumin level was 1.8 and prealbumin was less than 5; Albumin level today is 1.2 -We will advance diet after colonoscopy and EGD -Started Ensure Enlive p.o. twice daily and daily multivitamin to start after she has stopped receiving a banana bag -Nutrition is consulted for further evaluation recommendations  Hypo-kalemia -Patient potassium was 3.2 and improved to 3.5 -Replete with po Potassium Chloride 40 mEQ BID x 2 doses -Continue monitor and replete as necessary -Repeat CMP in a.m.  Hypomagnesemia -Patient magnesium levels 1.8 -Replete with IV mag sulfate 2 g yesterday  -Continue monitor and replete as necessary -Repeat magnesium level in a.m.  DVT prophylaxis: Lovenox 30 mg sq q24h  Code Status: FULL CODE Family Communication: No family  present at bedside  Disposition Plan: Anticipate D/C in the next 24-48 hours if medically stable and cleared by GI  Consultants:  [facility]/Dr. Benson Norway who is cross covering    Procedures:  EGD 07/20/18 Findings:      The esophagus was normal.      The stomach was normal.      The examined duodenum was normal. Biopsies were taken with a cold       forceps for histology. Impression:               - Normal esophagus.                           - Normal stomach.                           - Normal examined duodenum. Biopsied.  Colonoscopy 07/20/18 Findings:      The colon (entire examined portion) appeared normal. Biopsies were taken       with a cold forceps for histology. Impression:               - The entire examined colon is normal. Biopsied.  Antimicrobials:  Antibiotics Given (  last 72 hours)    None     Subjective: Seen and examined at bedside states she is feeling ok today. Had some abdominal pain yesterday but improved after she had abdominal pain no chest pain, lightheadedness or dizziness.  States that she slept okay as well.  No other concerns or complaints at this time and understands that she will be started on steroids per GI recommendations.  Objective: Vitals:   07/20/18 1322 07/20/18 1611 07/20/18 2052 07/21/18 0528  BP: (!) 129/98 (!) 123/96 (!) 131/97 125/70  Pulse: 83 72 93 (!) 101  Resp: 20 18 19 16   Temp:  98.5 F (36.9 C) 98.7 F (37.1 C) 98.8 F (37.1 C)  TempSrc:  Oral Oral Oral  SpO2: 99% 100% 93% 99%  Weight:      Height:        Intake/Output Summary (Last 24 hours) at 07/21/2018 1220 Last data filed at 07/21/2018 0600 Gross per 24 hour  Intake 3433.55 ml  Output 2 ml  Net 3431.55 ml   Filed Weights   07/18/18 1206 07/18/18 2016  Weight: 45.1 kg 46.8 kg   Examination: Physical Exam:  Constitutional: Patient is a thin African-American female is currently in no acute distress and ambulating well Eyes: Sclerae anicteric.  Lids  and conjunctive normal ENMT: External ears and nose appear normal.  Grossly normal hearing Neck: Appears supple with no JVD Respiratory: Diminished to auscultation bilaterally no appreciable wheezing, rales, rhonchi.  Patient was not using any accessory muscles to breathe Cardiovascular: Tachycardic rate but regular rhythm.  No appreciable murmurs, rubs, gallops.  No lower extremity edema noted Abdomen: Soft, nontender to palpate.  Nondistended.  Bowel sounds present all 4 quadrants GU: Deferred Musculoskeletal: No contractures or cyanosis.  No joint deformities noted Skin: Skin is warm and dry with no appreciable rashes or lesions limited skin evaluation Neurologic: Cranial nerves II through XII grossly intact no appreciable focal deficits. Psychiatric: Affect.  Intact judgment insight.  Patient is awake, alert, oriented x3  Data Reviewed: I have personally reviewed following labs and imaging studies  CBC: Recent Labs  Lab 07/18/18 1455 07/18/18 2233 07/19/18 0929 07/20/18 0459 07/21/18 0518  WBC 7.3 5.3 5.3 5.1 4.2  NEUTROABS 2.3  --  1.8 2.0 2.0  HGB 9.7* 9.4* 9.7* 8.1* 7.6*  HCT 29.4* 28.3* 30.3* 24.5* 23.5*  MCV 85.2 86.3 86.8 85.7 86.1  PLT 539* 514* 571* 453* 321*   Basic Metabolic Panel: Recent Labs  Lab 07/18/18 1455 07/18/18 2233 07/19/18 0929 07/20/18 0459 07/21/18 0518  NA 141  --  138 142 143  K 4.0  --  3.2* 3.9 3.5  CL 107  --  104 111 113*  CO2 27  --  23 24 24   GLUCOSE 69*  --  66* 77 95  BUN 7  --  5* <5* 6  CREATININE 0.45 0.51 0.38* 0.37* 0.50  CALCIUM 7.8*  --  8.1* 8.0* 7.6*  MG  --   --  1.8 1.6* 1.8  PHOS  --   --  2.8 3.0 3.6   GFR: Estimated Creatinine Clearance: 81.5 mL/min (by C-G formula based on SCr of 0.5 mg/dL). Liver Function Tests: Recent Labs  Lab 07/18/18 1455 07/19/18 0929 07/20/18 0459 07/21/18 0518  AST 30 26 16 19   ALT 14 15 12 13   ALKPHOS 104 115 81 101  BILITOT 0.5 0.3 0.3 <0.1*  PROT 5.8* 6.0* 4.7* 4.6*  ALBUMIN  1.7* 1.8* 1.3* 1.2*   No  results for input(s): LIPASE, AMYLASE in the last 168 hours. No results for input(s): AMMONIA in the last 168 hours. Coagulation Profile: No results for input(s): INR, PROTIME in the last 168 hours. Cardiac Enzymes: No results for input(s): CKTOTAL, CKMB, CKMBINDEX, TROPONINI in the last 168 hours. BNP (last 3 results) No results for input(s): PROBNP in the last 8760 hours. HbA1C: Recent Labs    07/19/18 0929  HGBA1C 4.0*   CBG: No results for input(s): GLUCAP in the last 168 hours. Lipid Profile: No results for input(s): CHOL, HDL, LDLCALC, TRIG, CHOLHDL, LDLDIRECT in the last 72 hours. Thyroid Function Tests: No results for input(s): TSH, T4TOTAL, FREET4, T3FREE, THYROIDAB in the last 72 hours. Anemia Panel: No results for input(s): VITAMINB12, FOLATE, FERRITIN, TIBC, IRON, RETICCTPCT in the last 72 hours. Sepsis Labs: No results for input(s): PROCALCITON, LATICACIDVEN in the last 168 hours.  No results found for this or any previous visit (from the past 240 hour(s)).   Radiology Studies: [place] [employer]  Result Date: 07/19/2018 If [facility] image not attached, placement could not be confirmed due to current cardiac rhythm.  Scheduled Meds: . methylPREDNISolone (SOLU-MEDROL) injection  40 mg Intravenous Q12H   Continuous Infusions: . [START ON 07/22/2018] banana bag IV 1000 mL    . dextrose 5 % and 0.9% NaCl      LOS: 2 days   Kerney Elbe, DO [facility] PAGER is on [place]  If 7PM-7AM, please contact night-coverage [email or web address] Password [email or web address] 07/21/2018, 12:20 PM

## 2018-07-22 LAB — COMPREHENSIVE METABOLIC PANEL
ALT: 15 U/L (ref 0–44)
AST: 18 U/L (ref 15–41)
Albumin: 1.5 g/dL — ABNORMAL LOW (ref 3.5–5.0)
Alkaline Phosphatase: 109 U/L (ref 38–126)
Anion gap: 5 (ref 5–15)
BUN: 5 mg/dL — ABNORMAL LOW (ref 6–20)
CO2: 23 mmol/L (ref 22–32)
Calcium: 8.4 mg/dL — ABNORMAL LOW (ref 8.9–10.3)
Chloride: 112 mmol/L — ABNORMAL HIGH (ref 98–111)
Creatinine, Ser: 0.34 mg/dL — ABNORMAL LOW (ref 0.44–1.00)
GFR calc Af Amer: >60 mL/min (ref >60)
GFR calc non Af Amer: >60 mL/min (ref >60)
Glucose, Bld: 111 mg/dL — ABNORMAL HIGH (ref 70–99)
Potassium: 4.6 mmol/L (ref 3.5–5.1)
Sodium: 140 mmol/L (ref 135–145)
Total Bilirubin: 0.3 mg/dL (ref 0.3–1.2)
Total Protein: 5.4 g/dL — ABNORMAL LOW (ref 6.5–8.1)

## 2018-07-22 LAB — CBC WITH DIFFERENTIAL/PLATELET
Basophils Absolute: 0 10*3/uL (ref 0.0–0.1)
Basophils Relative: 0 %
Eosinophils Absolute: 0 10*3/uL (ref 0.0–0.7)
Eosinophils Relative: 0 %
HCT: 28.2 % — ABNORMAL LOW (ref 36.0–46.0)
Hemoglobin: 9.1 g/dL — ABNORMAL LOW (ref 12.0–15.0)
Lymphocytes Relative: 32 %
Lymphs Abs: 1.9 10*3/uL (ref 0.7–4.0)
MCH: 28.2 pg (ref 26.0–34.0)
MCHC: 32.3 g/dL (ref 30.0–36.0)
MCV: 87.3 fL (ref 78.0–100.0)
Monocytes Absolute: 0.3 10*3/uL (ref 0.1–1.0)
Monocytes Relative: 5 %
Neutro Abs: 3.7 10*3/uL (ref 1.7–7.7)
Neutrophils Relative %: 63 %
Platelets: 590 10*3/uL — ABNORMAL HIGH (ref 150–400)
RBC: 3.23 MIL/uL — ABNORMAL LOW (ref 3.87–5.11)
RDW: 16.9 % — ABNORMAL HIGH (ref 11.5–15.5)
WBC: 6 10*3/uL (ref 4.0–10.5)

## 2018-07-22 LAB — PHOSPHORUS: Phosphorus: 3.2 mg/dL (ref 2.5–4.6)

## 2018-07-22 LAB — MAGNESIUM: Magnesium: 1.9 mg/dL (ref 1.7–2.4)

## 2018-07-22 NOTE — Progress Notes (Signed)
PROGRESS NOTE    Jennifer Cain  EXB:[id number] DOB: [birth year] DOA: 07/18/2018 PCP: Ladell Pier, MD   Brief Narrative:   HPI per Dr. Wells [name] is a 22 y.o. female with medical history significant of many  months of an unintentional weight loss, abdominal pain, nausea and vomiting.  Patient has had numerous ER visits and she has recently been referred to GI for further work-up.  She is Not currently on any medications.  She has been tried on Protonix, Bentyl and a short stent of steroids a few months ago none of which seem to help her symptoms.  The patient was so weak her mother could not get her out of bed which prompted him to bring her into the ED. Per  Report patient had a elevated transglutaminase level with a normal antigliadin IgA antibody.  sHe has a chronic anemia that is stable. She oes admit to depressed mood but denies any suicidal ideation.  States that she is able to eat however when she eats solids it either results in vomiting or diarrhea.  She states she is able to keep down liquids.   blood work was significant for a albumin of 1.7 .  And hemoglobin of 9.7 .  CT scan showed changes suggestive of enteritis but was similar to prior CT done in April.  ED Course: PA consulted GI,  DR Armbruster who recommended admission for FTT.  They  did not recommend any acute treatment at this time. Dr Posey Pronto accepted for admission.  An IV was unable to be obtained so the patient was given IM Ativan and IM morphine and 1 L IV fluid.  Interval Course: **GI Evaluated and patient to underwent EGD and Colonoscopy yesterday AM.  Dr. Bryan Lemma had also recommended checking stool studies including lactoferrin as well as cultures and Giardia.  Per GI if EGD and colonoscopy is unrevealing we will need to consider video capsule endoscopy for small bowel interrogation but Dr. Benson Norway feels as if this may be Crohn's Disease so has started the patient on IV Solumedrol. GI Pathogen Panel  was negative and EGD and Colonoscopy were negative for inflammation but the Terminal Ileum was not abl to examined.  Patient improving and will continue IV steroids today and possible discharge in a.m.  Assessment & Plan:   Principal Problem:   FTT (failure to thrive) in adult Active Problems:   Enteritis-Chronic   Protein calorie malnutrition (HCC)   Chronic diarrhea   Nausea & vomiting-chronic   Normocytic anemia   Generalized weakness   Thrombocytosis (HCC)   Hypokalemia   Malnutrition of moderate degree   Hypomagnesemia  Nausea/Vomiting/Abdominal Pain/Diarrhea and Weight loss in the setting of Chronic Enteritis r/o Infectious vs. Inflammatory conditions; GI Suspecting Crohn's Disease, improved -Admitted to Med-Surge -Patient states that she has lost approximately 90 pounds since last year and lost about 96 since April -States that she has significant nausea, vomiting, and loose stools every morning -Patient states that eating makes her feel nauseous and vomit -CT scan of the Abdomen and Pelvis showed abnormal circumferential wall thickening about multiple loops of the distal small bowel with in the mid and lower abdomen which was compatible with acute enteritis.  These findings are similar relative to the prior CT from 03/16/2018 there is also enlarged mesenteric adenopathy which is likely reactive and similar to previous -Stool studies have been checked including fecal lactoferrin stool cultures and Giardia, Patient's GI pathogen panel was negative; Enteric Precautions now discontinued -Patient CRP  was 1.8 and ESR was 30 -C/w Banana Bag Daily for micronutrient deficiency ad with D5W NS at 50 mL/hr when Banana Bag is not running.  Patient is also high risk for refeeding syndrome when tolerating p.o. we will continue to monitor electrolytes closely -Gastroenterology consulted for further evaluation recommendations and took the patient for EGD and colonoscopy; Biopsies were taken and are  pending -EGD showed Normal esophagus. Normal stomach. Normal examined duodenum. Biopsied. -Colonoscopy showed The colon (entire examined portion) appeared normal. Biopsies were taken with a cold forceps for histology -GI starting patient on a trial of Solumedrol 40 mg IV q12h for suspected Crohn's Disease and will continue today  Generalized weakness/Failure to Thrive secondary to above -Given IV fluid hydration with D5W NS at 100 mL/hr and Banana Bag Daily when banana bag is not running; Will decrease rate to 50 mL/hr as patient is eating better now  -Nutrition is consulted and appreciate further evaluation and recommendations; recommended Ensure Enlive and mils p.o. twice daily between meals -PT to evaluate and treat we will cancel order as patient is ambulating the room well without assistance  Normocytic Anemia -Hemoglobin/hematocrit trending down and was 9.7/30.3 -> 8.1/24.5 -> 7.6/23.5 and likely dilutional drop from IVF as above; hemoglobin/hematocrit is now 9.1/28.2 -Had an anemia panel done on 427/2019 which showed an iron level 34, U IBC of 68, TIBC 102, saturation ratios of 33%, ferritin level 212, folate level 7.4, and a Vitamin B12 level of 849 -Continue to monitor for signs and symptoms of bleeding -Repeat CBC in the a.m. -Follow-up on GI EGD and colonoscopy findings  Thrombocytosis -Likely Reactive -Patient had a platelet count 514 on admission yesterday platelet count was 571.  Trended down to 453 but had a slight uptick to 482 and now is 590 -Continue to monitor and Trend -Repeat CBC in AM   Moderate protein Calorie Malnutrition -Patient's albumin level was 1.8 and prealbumin was less than 5; Albumin level today is 1.5 -We will advance diet after colonoscopy and EGD -Started Ensure Enlive p.o. twice daily and daily multivitamin to start after she has stopped receiving a banana bag -Nutrition is consulted for further evaluation recommendations  Hypo-kalemia -Patient  potassium was 3.2 and improved to 4.6 -Replete with po Potassium Chloride 40 mEQ BID x 2 doses yesterday -Continue monitor and replete as necessary -Repeat CMP in a.m.  Hypomagnesemia -Patient magnesium levels 1.9 -Continue monitor and replete as necessary -Repeat magnesium level in a.m.  DVT prophylaxis: Lovenox 30 mg sq q24h  Code Status: FULL CODE Family Communication: No family present at bedside  Disposition Plan: Anticipate D/C in the next 24-48 hours if medically stable and cleared by GI and transitioned from IV steroids to p.o.  Consultants:  [facility]/Dr. Benson Norway who is cross covering    Procedures:  EGD 07/20/18 Findings:      The esophagus was normal.      The stomach was normal.      The examined duodenum was normal. Biopsies were taken with a cold       forceps for histology. Impression:               - Normal esophagus.                           - Normal stomach.                           -  Normal examined duodenum. Biopsied.  Colonoscopy 07/20/18 Findings:      The colon (entire examined portion) appeared normal. Biopsies were taken       with a cold forceps for histology. Impression:               - The entire examined colon is normal. Biopsied.  Antimicrobials:  Antibiotics Given (last 72 hours)    None     Subjective: Seen and examined at bedside and she is sitting on the bench next to bed and had no complaints.  Denied any chest pain, shortness breath, nausea, vomiting lightheadedness or dizziness.  States her abdominal pain has improved and she is tolerating food and eating better.  No other concerns or complaints at this time.  Objective: Vitals:   07/21/18 0528 07/21/18 1422 07/21/18 2022 07/22/18 0458  BP: 125/70 129/90 (!) 132/93 (!) 129/98  Pulse: (!) 101 89 83 79  Resp: 16 20 18 16   Temp: 98.8 F (37.1 C) 98.6 F (37 C) 98.3 F (36.8 C) 97.6 F (36.4 C)  TempSrc: Oral Oral Oral Oral  SpO2: 99% 100% 100% 99%  Weight:        Height:        Intake/Output Summary (Last 24 hours) at 07/22/2018 1245 Last data filed at 07/21/2018 1851 Gross per 24 hour  Intake 480 ml  Output 3 ml  Net 477 ml   Filed Weights   07/18/18 1206 07/18/18 2016  Weight: 45.1 kg 46.8 kg   Examination: Physical Exam:  Constitutional: Patient is a thin African-American female is currently in no acute distress and is ambulating well and feeling well Eyes: Sclera anicteric.  Lids and conjunctive are normal ENMT: External ears and nose appear normal.  Grossly normal hearing.  Extremities appear moist Neck: Neck is supple with no JVD Respiratory: Diminished to auscultation bilaterally no appreciable wheezing, rales, rhonchi.  Patient had unlabored breathing and was not in any acute respiratory distress Cardiovascular: Tachycardic rate but regular rhythm.  No appreciable murmurs, rubs, gallops.  No lower extremity edema noted Abdomen: Soft, nontender, nondistended.  Bowel sounds present all 4 quadrants GU: Deferred Musculoskeletal: Dr. cyanosis.  No joint deformities noted Skin: Skin is warm and dry no appreciable rashes or lesions limited skin evaluation Neurologic: Renal nerves II through XII grossly intact no appreciable focal deficits Psychiatric: Normal mood and affect.  Intact judgment and insight.  Patient is awake, alert, and oriented x3  Data Reviewed: I have personally reviewed following labs and imaging studies  CBC: Recent Labs  Lab 07/18/18 1455 07/18/18 2233 07/19/18 0929 07/20/18 0459 07/21/18 0518 07/22/18 0546  WBC 7.3 5.3 5.3 5.1 4.2 6.0  NEUTROABS 2.3  --  1.8 2.0 2.0 3.7  HGB 9.7* 9.4* 9.7* 8.1* 7.6* 9.1*  HCT 29.4* 28.3* 30.3* 24.5* 23.5* 28.2*  MCV 85.2 86.3 86.8 85.7 86.1 87.3  PLT 539* 514* 571* 453* 482* 191*   Basic Metabolic Panel: Recent Labs  Lab 07/18/18 1455 07/18/18 2233 07/19/18 0929 07/20/18 0459 07/21/18 0518 07/22/18 0546  NA 141  --  138 142 143 140  K 4.0  --  3.2* 3.9 3.5 4.6  CL  107  --  104 111 113* 112*  CO2 27  --  23 24 24 23   GLUCOSE 69*  --  66* 77 95 111*  BUN 7  --  5* <5* 6 5*  CREATININE 0.45 0.51 0.38* 0.37* 0.50 0.34*  CALCIUM 7.8*  --  8.1* 8.0* 7.6* 8.4*  MG  --   --  1.8 1.6* 1.8 1.9  PHOS  --   --  2.8 3.0 3.6 3.2   GFR: Estimated Creatinine Clearance: 81.5 mL/min (A) (by C-G formula based on SCr of 0.34 mg/dL (L)). Liver Function Tests: Recent Labs  Lab 07/18/18 1455 07/19/18 0929 07/20/18 0459 07/21/18 0518 07/22/18 0546  AST 30 26 16 19 18   ALT 14 15 12 13 15   ALKPHOS 104 115 81 101 109  BILITOT 0.5 0.3 0.3 <0.1* 0.3  PROT 5.8* 6.0* 4.7* 4.6* 5.4*  ALBUMIN 1.7* 1.8* 1.3* 1.2* 1.5*   No results for input(s): LIPASE, AMYLASE in the last 168 hours. No results for input(s): AMMONIA in the last 168 hours. Coagulation Profile: No results for input(s): INR, PROTIME in the last 168 hours. Cardiac Enzymes: No results for input(s): CKTOTAL, CKMB, CKMBINDEX, TROPONINI in the last 168 hours. BNP (last 3 results) No results for input(s): PROBNP in the last 8760 hours. HbA1C: No results for input(s): HGBA1C in the last 72 hours. CBG: No results for input(s): GLUCAP in the last 168 hours. Lipid Profile: No results for input(s): CHOL, HDL, LDLCALC, TRIG, CHOLHDL, LDLDIRECT in the last 72 hours. Thyroid Function Tests: No results for input(s): TSH, T4TOTAL, FREET4, T3FREE, THYROIDAB in the last 72 hours. Anemia Panel: No results for input(s): VITAMINB12, FOLATE, FERRITIN, TIBC, IRON, RETICCTPCT in the last 72 hours. Sepsis Labs: No results for input(s): PROCALCITON, LATICACIDVEN in the last 168 hours.  Recent Results (from the past 240 hour(s))  Gastrointestinal Panel by PCR , Stool     Status: None   Collection Time: 07/21/18  1:40 AM  Result Value Ref Range Status   Campylobacter species NOT DETECTED NOT DETECTED Final   Plesimonas shigelloides NOT DETECTED NOT DETECTED Final   Salmonella species NOT DETECTED NOT DETECTED Final    Yersinia enterocolitica NOT DETECTED NOT DETECTED Final   Vibrio species NOT DETECTED NOT DETECTED Final   Vibrio cholerae NOT DETECTED NOT DETECTED Final   Enteroaggregative E coli (EAEC) NOT DETECTED NOT DETECTED Final   Enteropathogenic E coli (EPEC) NOT DETECTED NOT DETECTED Final   Enterotoxigenic E coli (ETEC) NOT DETECTED NOT DETECTED Final   Shiga like toxin producing E coli (STEC) NOT DETECTED NOT DETECTED Final   Shigella/Enteroinvasive E coli (EIEC) NOT DETECTED NOT DETECTED Final   Cryptosporidium NOT DETECTED NOT DETECTED Final   Cyclospora cayetanensis NOT DETECTED NOT DETECTED Final   Entamoeba histolytica NOT DETECTED NOT DETECTED Final   Giardia lamblia NOT DETECTED NOT DETECTED Final   Adenovirus F40/41 NOT DETECTED NOT DETECTED Final   Astrovirus NOT DETECTED NOT DETECTED Final   Norovirus GI/GII NOT DETECTED NOT DETECTED Final   Rotavirus A NOT DETECTED NOT DETECTED Final   Sapovirus (I, II, IV, and V) NOT DETECTED NOT DETECTED Final    Comment: Performed at [facility], [address], [place], [place] [address]    Radiology Studies: No results found. Scheduled Meds: . feeding supplement (ENSURE ENLIVE)  237 mL Oral BID BM  . methylPREDNISolone (SOLU-MEDROL) injection  40 mg Intravenous Q12H   Continuous Infusions: . banana bag IV 1000 mL 100 mL/hr at 07/22/18 0549  . dextrose 5 % and 0.9% NaCl Stopped (07/22/18 0547)    LOS: 3 days   Kerney Elbe, DO [facility] PAGER is on AMION  If 7PM-7AM, please contact night-coverage [email or web address] Password [email or web address] 07/22/2018, 12:45 PM

## 2018-07-22 NOTE — Progress Notes (Deleted)
Subjective: Feeling better.  He felt poorly most of the day yesterday, but last evening he was able to tolerate some PO.    Objective: Vital signs in last 24 hours: Temp:  [97.6 F (36.4 C)-98.6 F (37 C)] 97.6 F (36.4 C) (09/02 0458) Pulse Rate:  [79-89] 79 (09/02 0458) Resp:  [16-20] 16 (09/02 0458) BP: (129-132)/(90-98) 129/98 (09/02 0458) SpO2:  [99 %-100 %] 99 % (09/02 0458) Last BM Date: 07/20/18  Intake/Output from previous day: 09/01 0701 - 09/02 0700 In: 720 [P.O.:720] Out: 4 [Urine:4] Intake/Output this shift: No intake/output data recorded.  General appearance: alert and no distress GI: soft, non-tender; bowel sounds normal; no masses,  no organomegaly and no distension  Lab Results: Recent Labs    07/20/18 0459 07/21/18 0518 07/22/18 0546  WBC 5.1 4.2 6.0  HGB 8.1* 7.6* 9.1*  HCT 24.5* 23.5* 28.2*  PLT 453* 482* 590*   BMET Recent Labs    07/20/18 0459 07/21/18 0518 07/22/18 0546  NA 142 143 140  K 3.9 3.5 4.6  CL 111 113* 112*  CO2 24 24 23   GLUCOSE 77 95 111*  BUN <5* 6 5*  CREATININE 0.37* 0.50 0.34*  CALCIUM 8.0* 7.6* 8.4*   LFT Recent Labs    07/22/18 0546  PROT 5.4*  ALBUMIN 1.5*  AST 18  ALT 15  ALKPHOS 109  BILITOT 0.3   PT/INR No results for input(s): LABPROT, INR in the last 72 hours. Hepatitis Panel No results for input(s): HEPBSAG, HCVAB, HEPAIGM, HEPBIGM in the last 72 hours. C-Diff No results for input(s): CDIFFTOX in the last 72 hours. Fecal Lactopherrin No results for input(s): FECLLACTOFRN in the last 72 hours.  Studies/Results: No results found.  Medications:  Scheduled: . feeding supplement (ENSURE ENLIVE)  237 mL Oral BID BM  . methylPREDNISolone (SOLU-MEDROL) injection  40 mg Intravenous Q12H   Continuous: . banana bag IV 1000 mL 100 mL/hr at 07/22/18 0549  . dextrose 5 % and 0.9% NaCl Stopped (07/22/18 0547)    Assessment/Plan: 1) Nausea /Vomiting - improving. 2) LA Grade D esophagitis. 3)  Presumed gastroparesis. 4) History of uncontrolled DM.   The patient continues to improve daily.  The hope is that he will be able to resolve the nausea and vomiting or have the symptoms reduced to a minimum.  DKA can cause an acute gastroparesis.  There was no formal testing for gastroparesis and he will need the GES when he is not acutely ill.  This can be performed as an outpatient with [facility].  Plan: 1) Continue with the current level of supportive care. 2) [facility] to resume care in the AM.  LOS: 3 days   Devan Danzer D 07/22/2018, 7:20 AM

## 2018-07-22 NOTE — Progress Notes (Signed)
Subjective: Feeling well.  No complaints of diarrhea or abdominal pain.  Objective: Vital signs in last 24 hours: Temp:  [97.6 F (36.4 C)-98.6 F (37 C)] 97.6 F (36.4 C) (09/02 0458) Pulse Rate:  [79-89] 79 (09/02 0458) Resp:  [16-20] 16 (09/02 0458) BP: (129-132)/(90-98) 129/98 (09/02 0458) SpO2:  [99 %-100 %] 99 % (09/02 0458) Last BM Date: 07/20/18  Intake/Output from previous day: 09/01 0701 - 09/02 0700 In: 720 [P.O.:720] Out: 4 [Urine:4] Intake/Output this shift: No intake/output data recorded.  General appearance: alert and no distress GI: soft, non-tender; bowel sounds normal; no masses,  no organomegaly  Lab Results: Recent Labs    07/20/18 0459 07/21/18 0518 07/22/18 0546  WBC 5.1 4.2 6.0  HGB 8.1* 7.6* 9.1*  HCT 24.5* 23.5* 28.2*  PLT 453* 482* 590*   BMET Recent Labs    07/20/18 0459 07/21/18 0518 07/22/18 0546  NA 142 143 140  K 3.9 3.5 4.6  CL 111 113* 112*  CO2 24 24 23   GLUCOSE 77 95 111*  BUN <5* 6 5*  CREATININE 0.37* 0.50 0.34*  CALCIUM 8.0* 7.6* 8.4*   LFT Recent Labs    07/22/18 0546  PROT 5.4*  ALBUMIN 1.5*  AST 18  ALT 15  ALKPHOS 109  BILITOT 0.3   PT/INR No results for input(s): LABPROT, INR in the last 72 hours. Hepatitis Panel No results for input(s): HEPBSAG, HCVAB, HEPAIGM, HEPBIGM in the last 72 hours. C-Diff No results for input(s): CDIFFTOX in the last 72 hours. Fecal Lactopherrin No results for input(s): FECLLACTOFRN in the last 72 hours.  Studies/Results: No results found.  Medications:  Scheduled: . feeding supplement (ENSURE ENLIVE)  237 mL Oral BID BM  . methylPREDNISolone (SOLU-MEDROL) injection  40 mg Intravenous Q12H   Continuous: . banana bag IV 1000 mL 100 mL/hr at 07/22/18 0549  . dextrose 5 % and 0.9% NaCl Stopped (07/22/18 0547)    Assessment/Plan: 1) Probable small bowel Crohn's disease. 2) Failure to thrive - improving.   The patient was started on Solumedrol and she was able to  eat yesterday without any GI issues.  Clinically it appears that she has made a response.  Plan: 1) Continue with Solumedrol.  If she continues to improve she can be transitioned to oral tomorrow.  ? D/C tomorrow. 2) [facility] will resume care in the AM.  LOS: 3 days   Larisa Lanius D 07/22/2018, 7:27 AM

## 2018-07-22 NOTE — Care Management Note (Signed)
Case Management Note  Patient Details  Name: Jennifer Cain MRN: [id number] Date of Birth: Feb 05, 1996  Subjective/Objective:                  22 y.o.femalewith medical history significant ofmanymonths of an unintentional weight loss,abdominal pain,nausea and vomiting. Patient has had numerous ER visits and she has recently been referred to GI for further work-up. She isNot currently on any medications. She has been tried on Protonix,Bentyl and a short stent of steroids a few months ago none of which seem to help her symptoms. The patient was so weak her mother could not get her out of bed which prompted him to bring her into the ED. PerReport patient had a elevated transglutaminase level with a normal antigliadin IgA antibody. sHe has a chronic anemia that is stable.Sheoes admit to depressed mood but denies any suicidal ideation. States that she is able to eat however when she eats solids it either results invomiting or diarrhea. She states she is able to keep down liquids. blood work was significant for a albumin of 1.7 .And hemoglobin of 9.7 .CT scan showed changes suggestive of enteritis but was similar to prior CT done in April.  ED Course:PA consulted GI, DR Armbruster who recommended admission for FTT. They did not recommend any acute treatment at this time. Dr Posey Pronto accepted for admission.AnIV was unable to be obtained sothe patient was given IM Ativan and IM morphine and 1 L IV fluid.  Interval Course: **GI Evaluated and patient to underwent EGD and Colonoscopy yesterday AM.  Dr. Bryan Lemma had also recommended checking stool studies including lactoferrin as well as cultures and Giardia.  Per GI if EGD and colonoscopy is unrevealing we will need to consider video capsule endoscopy for small bowel interrogation but Dr. Benson Norway feels as if this may be Crohn's Disease so has started the patient on IV Solumedrol. GI Pathogen Panel is still pending and EGD and Colonoscopy  were negative for inflammation but the Terminal Ileum was not abl to examined.   Assessment & Plan:   Principal Problem:   FTT (failure to thrive) in adult Active Problems:   Enteritis-Chronic   Protein calorie malnutrition (HCC)   Chronic diarrhea   Nausea & vomiting-chronic   Normocytic anemia   Generalized weakness   Thrombocytosis (HCC)   Hypokalemia   Malnutrition of moderate degree   Hypomagnesemia  Nausea/Vomiting/Abdominal Pain/Diarrhea and Weight loss in the setting of Chronic Enteritis r/o Infectious vs. Inflammatory conditions; GI Suspecting Crohn's Disease  -Admitted to Med-Surge -Patient states that she has lost approximately 90 pounds since last year and lost about 32 since April -States that she has significant nausea, vomiting, and loose stools every morning -Patient states that eating makes her feel nauseous and vomit -CT scan of the Abdomen and Pelvis showed abnormal circumferential wall thickening about multiple loops of the distal small bowel with in the mid and lower abdomen which was compatible with acute enteritis.  These findings are similar relative to the prior CT from 03/16/2018 there is also enlarged mesenteric adenopathy which is likely reactive and similar to previous -Stool studies have been checked including fecal lactoferrin stool cultures and Giardia; Enteric Precautions for now until stool studies result  -Patient CRP was 1.8 and ESR was 30 -C/w Banana Bag Daily for micronutrient deficiency ad with D5W NS at 100 mL/hr when Banana Bag is not running.  Patient is also high risk for refeeding syndrome when tolerating p.o. we will continue to monitor electrolytes closely -Gastroenterology  consulted for further evaluation recommendations and took the patient for EGD and colonoscopy; Biopsies were taken -EGD showed Normal esophagus. Normal stomach. Normal examined duodenum. Biopsied. -Colonoscopy showed The colon (entire examined portion) appeared normal.  Biopsies were taken with a cold forceps for histology -GI starting patient on a trial of Solumedrol 40 mg IV q12h for suspected Crohn's Disease   Generalized weakness/Failure to Thrive secondary to above -Continue with IV fluid hydration with D5W NS at 100 mL/hr and Banana Bag Daily  -Nutrition is consulted and appreciate further evaluation and recommendations  -PT to evaluate and treat  Normocytic Anemia -Hemoglobin/hematocrit trending down and was 9.7/30.3 -> 8.1/24.5 -> 7.6/23.5 and likely dilutional drop from IVF as above  -Had an anemia panel done on 427/2019 which showed an iron level 34, U IBC of 68, TIBC 102, saturation ratios of 33%, ferritin level 212, folate level 7.4, and a Vitamin B12 level of 849 -Continue to monitor for signs and symptoms of bleeding -Repeat CBC in the a.m. -Follow-up on GI EGD and colonoscopy findings  Thrombocytosis -Likely Reactive -Patient had a platelet count 514 on admission yesterday platelet count was 571.  Trended down to 453 but had a slight uptick to 482 -Continue to monitor and Trend -Repeat CBC in AM   Moderate protein Calorie Malnutrition -Patient's albumin level was 1.8 and prealbumin was less than 5; Albumin level today is 1.2 -We will advance diet after colonoscopy and EGD -Started Ensure Enlive p.o. twice daily and daily multivitamin to start after she has stopped receiving a banana bag -Nutrition is consulted for further evaluation recommendations  Hypo-kalemia -Patient potassium was 3.2 and improved to 3.5 -Replete with po Potassium Chloride 40 mEQ BID x 2 doses -Continue monitor and replete as necessary -Repeat CMP in a.m.  Hypomagnesemia -Patient magnesium levels 1.8 -Replete with IV mag sulfate 2 g yesterday  -Continue monitor and replete as necessary -Repeat magnesium level in a.m.  DVT prophylaxis: Lovenox 30 mg sq q24h  Code Status: FULL CODE Family Communication: No family present at bedside  Disposition Plan:  Anticipate D/C in the next 24-48 hours if medically stable and cleared by GI Gi note=Subjective: Feeling well.  No complaints of diarrhea or abdominal pain.  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 2) Failure to thrive - improving.              The patient was started on Solumedrol and she was able to eat yesterday without any GI issues.  Clinically it appears that she has made a response.  Plan: 1) Continue with Solumedrol.  If she continues to improve she can be transitioned to oral tomorrow.  ? D/C tomorrow. 2) [facility] will resume care in the AM. Action/Plan: Goal Length of Stay: Ambulatory or 2 days  Note: Goal Length of Stay assumes optimal recovery, decision making, and care. Patients may be discharged to a lower level of care (either later than or sooner than the goal) when it is appropriate for their clinical status and care needs. Discharge Readiness Return to top of Inflammatory Bowel Disease [facility] - [place]  Discharge readiness is indicated by patient meeting Recovery Milestones, including ALL of the following: ? Hemodynamic  stability=yes ? Surgery not indicated n/a ? Bloody diarrhea absent or improved sufficiently for next level of care not present ? Hgb/Hct stable hgb 9.1 hct-28.2 ?  Fever absent- not present ? No evidence of infection requiring inpatient care=improving iv abx ? No evidence of peritonitis or abscess ? Vomiting absent ? Renal function at baseline or appropriate for next level of care ? Electrolytes normal or improved ? Ambulatory ? Oral hydration, medications,[L] and diet ? Reg. Diet/iv flds- ns at 100cc/hrs/          ? Discharge plans and education  understood/ expect dc within 24 hrs/no orders at this time.   Expected Discharge Date:                  Expected Discharge Plan:  Home/Self Care  In-House Referral:     Discharge planning Services  CM Consult  Post Acute Care Choice:    Choice offered to:     DME Arranged:    DME Agency:     HH Arranged:    HH Agency:     Status of Service:  In process, will continue to follow  If discussed at Long Length of Stay Meetings, dates discussed:    Additional Comments:  Leeroy Cha, RN 07/22/2018, 12:07 PM

## 2018-07-23 ENCOUNTER — Encounter (HOSPITAL_COMMUNITY): Payer: Self-pay | Admitting: Gastroenterology

## 2018-07-23 ENCOUNTER — Ambulatory Visit: Payer: Self-pay | Admitting: Gastroenterology

## 2018-07-23 DIAGNOSIS — R109 Unspecified abdominal pain: Secondary | ICD-10-CM

## 2018-07-23 DIAGNOSIS — G8929 Other chronic pain: Secondary | ICD-10-CM

## 2018-07-23 LAB — CBC WITH DIFFERENTIAL/PLATELET
Basophils Absolute: 0 10*3/uL (ref 0.0–0.1)
Basophils Relative: 0 %
Eosinophils Absolute: 0 10*3/uL (ref 0.0–0.7)
Eosinophils Relative: 0 %
HCT: 24.1 % — ABNORMAL LOW (ref 36.0–46.0)
Hemoglobin: 7.9 g/dL — ABNORMAL LOW (ref 12.0–15.0)
Lymphocytes Relative: 28 %
Lymphs Abs: 1.5 10*3/uL (ref 0.7–4.0)
MCH: 28.5 pg (ref 26.0–34.0)
MCHC: 32.8 g/dL (ref 30.0–36.0)
MCV: 87 fL (ref 78.0–100.0)
Monocytes Absolute: 0.3 10*3/uL (ref 0.1–1.0)
Monocytes Relative: 6 %
Neutro Abs: 3.5 10*3/uL (ref 1.7–7.7)
Neutrophils Relative %: 66 %
Platelets: 502 10*3/uL — ABNORMAL HIGH (ref 150–400)
RBC: 2.77 MIL/uL — ABNORMAL LOW (ref 3.87–5.11)
RDW: 17.1 % — ABNORMAL HIGH (ref 11.5–15.5)
WBC: 5.4 10*3/uL (ref 4.0–10.5)

## 2018-07-23 LAB — COMPREHENSIVE METABOLIC PANEL
ALT: 14 U/L (ref 0–44)
AST: 18 U/L (ref 15–41)
Albumin: 1.3 g/dL — ABNORMAL LOW (ref 3.5–5.0)
Alkaline Phosphatase: 105 U/L (ref 38–126)
Anion gap: 6 (ref 5–15)
BUN: 7 mg/dL (ref 6–20)
CO2: 24 mmol/L (ref 22–32)
Calcium: 8.3 mg/dL — ABNORMAL LOW (ref 8.9–10.3)
Chloride: 113 mmol/L — ABNORMAL HIGH (ref 98–111)
Creatinine, Ser: 0.46 mg/dL (ref 0.44–1.00)
GFR calc Af Amer: >60 mL/min (ref >60)
GFR calc non Af Amer: >60 mL/min (ref >60)
Glucose, Bld: 152 mg/dL — ABNORMAL HIGH (ref 70–99)
Potassium: 4.1 mmol/L (ref 3.5–5.1)
Sodium: 143 mmol/L (ref 135–145)
Total Bilirubin: 0.3 mg/dL (ref 0.3–1.2)
Total Protein: 4.9 g/dL — ABNORMAL LOW (ref 6.5–8.1)

## 2018-07-23 LAB — MAGNESIUM: Magnesium: 1.8 mg/dL (ref 1.7–2.4)

## 2018-07-23 LAB — CALPROTECTIN, FECAL: Calprotectin, Fecal: 1013 ug/g — ABNORMAL HIGH (ref 0–120)

## 2018-07-23 LAB — PHOSPHORUS: Phosphorus: 3.1 mg/dL (ref 2.5–4.6)

## 2018-07-23 MED ORDER — BOOST / RESOURCE BREEZE PO LIQD CUSTOM
1.0000 | Freq: Three times a day (TID) | ORAL | Status: DC
  Administered 2018-07-24: 1 via ORAL

## 2018-07-23 MED ORDER — ADULT MULTIVITAMIN W/MINERALS CH
1.0000 | ORAL_TABLET | Freq: Every day | ORAL | Status: DC
  Administered 2018-07-23 – 2018-07-24 (×2): 1 via ORAL
  Filled 2018-07-23 (×2): qty 1

## 2018-07-23 NOTE — Progress Notes (Signed)
PROGRESS NOTE    Jennifer Cain  MGQ:[id number] DOB: [birth year] DOA: 07/18/2018 PCP: Ladell Pier, MD   Brief Narrative:   HPI per Dr. Wells [name] Jennifer Skluzacek is a 22 y.o. female with medical history significant of many  months of an unintentional weight loss, abdominal pain, nausea and vomiting.  Patient has had numerous ER visits and she has recently been referred to GI for further work-up.  She is Not currently on any medications.  She has been tried on Protonix, Bentyl and a short stent of steroids a few months ago none of which seem to help her symptoms.  The patient was so weak her mother could not get her out of bed which prompted him to bring her into the ED. Per  Report patient had a elevated transglutaminase level with a normal antigliadin IgA antibody.  sHe has a chronic anemia that is stable. She oes admit to depressed mood but denies any suicidal ideation.  States that she is able to eat however when she eats solids it either results in vomiting or diarrhea.  She states she is able to keep down liquids.   blood work was significant for a albumin of 1.7 .  And hemoglobin of 9.7 .  CT scan showed changes suggestive of enteritis but was similar to prior CT done in April.  ED Course: PA consulted GI,  DR Armbruster who recommended admission for FTT.  They  did not recommend any acute treatment at this time. Dr Posey Pronto accepted for admission.  An IV was unable to be obtained so the patient was given IM Ativan and IM morphine and 1 L IV fluid.  Interval Course: **GI Evaluated and patient to underwent EGD and Colonoscopy on 07/21/18.  Dr. Bryan Lemma had also recommended checking stool studies including lactoferrin as well as cultures and Giardia.  Per GI if EGD and colonoscopy is unrevealing we will need to consider video capsule endoscopy for small bowel interrogation but Dr. Benson Norway feels as if this may be Crohn's Disease so has started the patient on IV Solumedrol. GI Pathogen Panel was  negative and EGD and Colonoscopy were negative for inflammation but the Terminal Ileum was not abl to examined.  Patient improving and IV Steroids to be changed to Prednisone 40 mg po Daily and possible D/C Tomorrow.  Assessment & Plan:   Principal Problem:   FTT (failure to thrive) in adult Active Problems:   Enteritis-Chronic   Protein calorie malnutrition (HCC)   Chronic diarrhea   Nausea & vomiting-chronic   Normocytic anemia   Generalized weakness   Thrombocytosis (HCC)   Hypokalemia   Malnutrition of moderate degree   Hypomagnesemia  Nausea/Vomiting/Abdominal Pain/Diarrhea and Weight loss in the setting of Chronic Enteritis r/o Infectious vs. Inflammatory conditions; GI Suspecting Crohn's Disease, improved -Admitted to Med-Surge -Patient states that she has lost approximately 90 pounds since last year and lost about 49 since April -States that she has significant nausea, vomiting, and loose stools every morning -Patient states that eating makes her feel nauseous and vomit -CT scan of the Abdomen and Pelvis showed abnormal circumferential wall thickening about multiple loops of the distal small bowel with in the mid and lower abdomen which was compatible with acute enteritis.  These findings are similar relative to the prior CT from 03/16/2018 there is also enlarged mesenteric adenopathy which is likely reactive and similar to previous -Stool studies have been checked including fecal lactoferrin stool cultures and Giardia, Patient's GI pathogen panel was negative; Enteric  Precautions now discontinued -Patient CRP was 1.8 and ESR was 30 -Discontinued Banana Bag Daily for micronutrient deficiency ad with D5W NS at 50 mL/hr when Banana Bag is not running.  Patient is also high risk for refeeding syndrome when tolerating p.o. we will continue to monitor electrolytes closely; Will start MVI + Minerals now  -Gastroenterology consulted for further evaluation recommendations and took the  patient for EGD and colonoscopy; Biopsies were taken and are pending -EGD showed Normal esophagus. Normal stomach. Normal examined duodenum. Biopsied. -Colonoscopy showed The colon (entire examined portion) appeared normal. Biopsies were taken with a cold forceps for histology -GI started patient on a trial of Solumedrol 40 mg IV q12h for suspected Crohn's Disease and was changed to po Prednisone 40 mg po Daily today -Per GI, possible D/C tomorrow  Generalized weakness/Failure to Thrive secondary to above -D/C'd IVF -Nutrition is consulted and appreciate further evaluation and recommendations; recommended Ensure Enlive and mils p.o. twice daily between meals -GI added Boost Breeze Supplements TID  -Added MVI + Minerals  -PT to evaluate and treat we will cancel order as patient is ambulating the room well without assistance  Normocytic Anemia -Hemoglobin/hematocrit trending down and was 9.7/30.3 -> 8.1/24.5 -> 7.6/23.5 and likely dilutional drop from IVF as above; hemoglobin/hematocrit is now 7.9/24.1 -Had an anemia panel done on 427/2019 which showed an iron level 34, U IBC of 68, TIBC 102, saturation ratios of 33%, ferritin level 212, folate level 7.4, and a Vitamin B12 level of 849 -Continue to monitor for signs and symptoms of bleeding -Repeat CBC in the a.m.  Thrombocytosis -Likely Reactive -Patient had a platelet count that was elevated and now 502 -Continue to monitor and Trend -Repeat CBC in AM   Moderate protein Calorie Malnutrition -Patient's albumin level was 1.8 and prealbumin was less than 5; Albumin level today is 1.3 -We will advance diet after colonoscopy and EGD -Started Ensure Enlive p.o. twice daily and daily multivitamin to start now that she has stopped receiving a banana bag and IVF -Nutrition is consulted for further evaluation recommendations  Hypo-kalemia -Patient potassium was 3.2 and improved to 4.1 -Continue monitor and replete as necessary -Repeat CMP in  a.m.  Hypomagnesemia -Patient magnesium levels 1.8 -Continue monitor and replete as necessary -Repeat magnesium level in a.m.  Sinus Tachycardia, improved -Patient was Tachycardic but improved after IVF Hydration -Continue to Monitor   DVT prophylaxis: Lovenox 30 mg sq q24h  Code Status: FULL CODE Family Communication: No family present at bedside  Disposition Plan: Anticipate D/C in the next 24hours if medically stable and cleared by GI and transitioned from IV steroids to p.o. And tolerating that well  Consultants:  [facility]   Procedures:  EGD 07/20/18 Findings:      The esophagus was normal.      The stomach was normal.      The examined duodenum was normal. Biopsies were taken with a cold       forceps for histology. Impression:               - Normal esophagus.                           - Normal stomach.                           - Normal examined duodenum. Biopsied.  Colonoscopy 07/20/18 Findings:      The colon (  entire examined portion) appeared normal. Biopsies were taken       with a cold forceps for histology. Impression:               - The entire examined colon is normal. Biopsied.  Antimicrobials:  Antibiotics Given (last 72 hours)    None     Subjective: Seen and examined at bedside she was doing well.  Denied any chest pain, shortness with, nausea, vomiting.  Tolerating food better and denies any abdominal pain.  Bowel movements have improved to and denied any other complaints or concerns.  Objective: Vitals:   07/22/18 0458 07/22/18 1340 07/22/18 2008 07/23/18 0425  BP: (!) 129/98 (!) 134/99 (!) 127/94 130/88  Pulse: 79 76 64 (!) 59  Resp: 16  16 18   Temp: 97.6 F (36.4 C) 98.4 F (36.9 C) 98.7 F (37.1 C) (!) 97.5 F (36.4 C)  TempSrc: Oral Oral Oral Oral  SpO2: 99% 100% 100% 100%  Weight:      Height:        Intake/Output Summary (Last 24 hours) at 07/23/2018 1235 Last data filed at 07/23/2018 0600 Gross per 24 hour  Intake  1880.65 ml  Output -  Net 1880.65 ml   Filed Weights   07/18/18 1206 07/18/18 2016  Weight: 45.1 kg 46.8 kg   Examination: Physical Exam:  Constitutional: Thin AAF in NAD appears calm and comfortable sitting up in bedside and feels improved.  Eyes: Lids and conjunctive are normal; Sclerae anicteric ENMT: Ears and nose appear normal.  Mucous members are moist Neck: Supple with No JVD Respiratory: Clear to auscultation bilaterally no appreciable wheezing, rales, rhonchi.  Patient was not tachypneic using accessory muscles to breathe Cardiovascular: Regular rate and rhythm.  With no appreciable murmurs, rubs, gallops.  No lower extremity edema noted Abdomen: Soft, nontender, nondistended.  Bowel sounds present all 4 quadrants GU: Deferred Musculoskeletal: No contractures or cyanosis.  No joint deformities noted Skin: Skin is warm and dry no appreciable rashes or lesions on limited skin evaluation Neurologic: Cranial nerves II through XII grossly intact no appreciable focal deficits Psychiatric: Normal mood and affect.  Intact judgment insight.  Patient is awake and alert and oriented x3  Data Reviewed: I have personally reviewed following labs and imaging studies  CBC: Recent Labs  Lab 07/19/18 0929 07/20/18 0459 07/21/18 0518 07/22/18 0546 07/23/18 0516  WBC 5.3 5.1 4.2 6.0 5.4  NEUTROABS 1.8 2.0 2.0 3.7 3.5  HGB 9.7* 8.1* 7.6* 9.1* 7.9*  HCT 30.3* 24.5* 23.5* 28.2* 24.1*  MCV 86.8 85.7 86.1 87.3 87.0  PLT 571* 453* 482* 590* 808*   Basic Metabolic Panel: Recent Labs  Lab 07/19/18 0929 07/20/18 0459 07/21/18 0518 07/22/18 0546 07/23/18 0516  NA 138 142 143 140 143  K 3.2* 3.9 3.5 4.6 4.1  CL 104 111 113* 112* 113*  CO2 23 24 24 23 24   GLUCOSE 66* 77 95 111* 152*  BUN 5* <5* 6 5* 7  CREATININE 0.38* 0.37* 0.50 0.34* 0.46  CALCIUM 8.1* 8.0* 7.6* 8.4* 8.3*  MG 1.8 1.6* 1.8 1.9 1.8  PHOS 2.8 3.0 3.6 3.2 3.1   GFR: Estimated Creatinine Clearance: 81.5 mL/min (by  C-G formula based on SCr of 0.46 mg/dL). Liver Function Tests: Recent Labs  Lab 07/19/18 0929 07/20/18 0459 07/21/18 0518 07/22/18 0546 07/23/18 0516  AST 26 16 19 18 18   ALT 15 12 13 15 14   ALKPHOS 115 81 101 109 105  BILITOT 0.3 0.3 <0.1*  0.3 0.3  PROT 6.0* 4.7* 4.6* 5.4* 4.9*  ALBUMIN 1.8* 1.3* 1.2* 1.5* 1.3*   No results for input(s): LIPASE, AMYLASE in the last 168 hours. No results for input(s): AMMONIA in the last 168 hours. Coagulation Profile: No results for input(s): INR, PROTIME in the last 168 hours. Cardiac Enzymes: No results for input(s): CKTOTAL, CKMB, CKMBINDEX, TROPONINI in the last 168 hours. BNP (last 3 results) No results for input(s): PROBNP in the last 8760 hours. HbA1C: No results for input(s): HGBA1C in the last 72 hours. CBG: No results for input(s): GLUCAP in the last 168 hours. Lipid Profile: No results for input(s): CHOL, HDL, LDLCALC, TRIG, CHOLHDL, LDLDIRECT in the last 72 hours. Thyroid Function Tests: No results for input(s): TSH, T4TOTAL, FREET4, T3FREE, THYROIDAB in the last 72 hours. Anemia Panel: No results for input(s): VITAMINB12, FOLATE, FERRITIN, TIBC, IRON, RETICCTPCT in the last 72 hours. Sepsis Labs: No results for input(s): PROCALCITON, LATICACIDVEN in the last 168 hours.  Recent Results (from the past 240 hour(s))  Gastrointestinal Panel by PCR , Stool     Status: None   Collection Time: 07/21/18  1:40 AM  Result Value Ref Range Status   Campylobacter species NOT DETECTED NOT DETECTED Final   Plesimonas shigelloides NOT DETECTED NOT DETECTED Final   Salmonella species NOT DETECTED NOT DETECTED Final   Yersinia enterocolitica NOT DETECTED NOT DETECTED Final   Vibrio species NOT DETECTED NOT DETECTED Final   Vibrio cholerae NOT DETECTED NOT DETECTED Final   Enteroaggregative E coli (EAEC) NOT DETECTED NOT DETECTED Final   Enteropathogenic E coli (EPEC) NOT DETECTED NOT DETECTED Final   Enterotoxigenic E coli (ETEC) NOT  DETECTED NOT DETECTED Final   Shiga like toxin producing E coli (STEC) NOT DETECTED NOT DETECTED Final   Shigella/Enteroinvasive E coli (EIEC) NOT DETECTED NOT DETECTED Final   Cryptosporidium NOT DETECTED NOT DETECTED Final   Cyclospora cayetanensis NOT DETECTED NOT DETECTED Final   Entamoeba histolytica NOT DETECTED NOT DETECTED Final   Giardia lamblia NOT DETECTED NOT DETECTED Final   Adenovirus F40/41 NOT DETECTED NOT DETECTED Final   Astrovirus NOT DETECTED NOT DETECTED Final   Norovirus GI/GII NOT DETECTED NOT DETECTED Final   Rotavirus A NOT DETECTED NOT DETECTED Final   Sapovirus (I, II, IV, and V) NOT DETECTED NOT DETECTED Final    Comment: Performed at [facility], [address], [place], [place] [address]    Radiology Studies: No results found. Scheduled Meds: . feeding supplement  1 Container Oral TID BM  . feeding supplement (ENSURE ENLIVE)  237 mL Oral BID BM  . methylPREDNISolone (SOLU-MEDROL) injection  40 mg Intravenous Q12H   Continuous Infusions: . banana bag IV 1000 mL 100 mL/hr at 07/23/18 0602  . dextrose 5 % and 0.9% NaCl Stopped (07/23/18 0600)    LOS: 4 days   Kerney Elbe, DO [facility] PAGER is on [place]  If 7PM-7AM, please contact night-coverage [email or web address] Password [facility] 07/23/2018, 12:35 PM

## 2018-07-23 NOTE — Progress Notes (Addendum)
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  She has decreased abdominal pain and decreased liquid bowel movements which she was experiencing 3-4/day as an outpatient and has no further nausea or vomiting.  Tolerated some frosted flakes for breakfast this morning.  No new complaints.   Review of systems: No chest pain, dyspnea or dysuria   Objective   Vital signs in last 24 hours: Temp:  [97.5 F (36.4 C)-98.7 F (37.1 C)] 97.5 F (36.4 C) (09/03 0425) Pulse Rate:  [59-76] 59 (09/03 0425) Resp:  [16-18] 18 (09/03 0425) BP: (127-134)/(88-99) 130/88 (09/03 0425) SpO2:  [100 %] 100 % (09/03 0425) Last BM Date: 07/22/18 General:    AA female in NAD Heart:  Regular rate and rhythm; no murmurs Lungs: Respirations even and unlabored, lungs CTA bilaterally Abdomen:  Soft, mild generalized ttp and nondistended. Normal bowel sounds. Extremities:  Without edema. Neurologic:  Alert and oriented,  grossly normal neurologically. Psych:  Cooperative. Normal mood and affect.  Intake/Output from previous day: 09/02 0701 - 09/03 0700 In: 1880.7 [P.O.:120; I.V.:1760.7] Out: -   Lab Results: Recent Labs    07/21/18 0518 07/22/18 0546 07/23/18 0516  WBC 4.2 6.0 5.4  HGB 7.6* 9.1* 7.9*  HCT 23.5* 28.2* 24.1*  PLT 482* 590* 502*   BMET Recent Labs    07/21/18 0518 07/22/18 0546 07/23/18 0516  NA 143 140 143  K 3.5 4.6 4.1  CL 113* 112* 113*  CO2 24 23 24   GLUCOSE 95 111* 152*  BUN 6 5* 7  CREATININE 0.50 0.34* 0.46  CALCIUM 7.6* 8.4* 8.3*   LFT Recent Labs    07/23/18 0516  PROT 4.9*  ALBUMIN 1.3*  AST 18  ALT 14  ALKPHOS 105  BILITOT 0.3     Assessment / Plan:   Assessment: 1.  Probable small bowel Crohn's disease: Symptoms are improving, no further nausea or vomiting, diarrhea is slowing and abdominal pain is improving 2.  Failure  to thrive: Improving  Plan: 1.  We will change the patient from IV Solu-Medrol 40 mg twice daily to Prednisone oral 40 mg daily.  If patient tolerates this well may think about discharge tomorrow. 2.  Continue low fiber/low residue diet today.  Did change the patient from a regular diet. 3.  Also added in Boost supplements 3 times daily. 4.  Patient will need to follow-up with Dr. Bryan Lemma within 2 weeks of discharge 5.  Please await any further recommendations from Dr. Loletha Carrow later today  Thank you for your kind consultation, we will continue to follow along.   LOS: 4 days   Levin Erp  07/23/2018, 10:09 AM   I have discussed the case with the PA, and that is the plan I formulated. I personally interviewed and examined the patient.  Small bowel Crohn's disease with abdominal pain, chronic diarrhea and substantial weight loss with resultant severe protein calorie malnutrition indicated by albumin of 1.3. Although a tissue diagnosis might not be feasible given its location just out of reach of ileoscopy, small bowel lymphoma seems a less likely diagnosis.  Patient is improved already on IV steroids, we have transitioned her to oral steroids with plan for probable discharge tomorrow on 40 mg once daily.  We will then arrange outpatient follow-up with Dr. Bryan Lemma.  I will order a TB QuantiFERON gold test with her  morning labs tomorrow in anticipation of probable institution of biologic therapy in the near future.    Nelida Meuse III Office: [phone]

## 2018-07-24 DIAGNOSIS — K5000 Crohn's disease of small intestine without complications: Principal | ICD-10-CM

## 2018-07-24 DIAGNOSIS — E440 Moderate protein-calorie malnutrition: Secondary | ICD-10-CM

## 2018-07-24 DIAGNOSIS — E876 Hypokalemia: Secondary | ICD-10-CM

## 2018-07-24 DIAGNOSIS — D473 Essential (hemorrhagic) thrombocythemia: Secondary | ICD-10-CM

## 2018-07-24 DIAGNOSIS — R627 Adult failure to thrive: Secondary | ICD-10-CM

## 2018-07-24 LAB — CBC WITH DIFFERENTIAL/PLATELET
Basophils Absolute: 0 10*3/uL (ref 0.0–0.1)
Basophils Relative: 0 %
Eosinophils Absolute: 0 10*3/uL (ref 0.0–0.7)
Eosinophils Relative: 0 %
HCT: 25.9 % — ABNORMAL LOW (ref 36.0–46.0)
Hemoglobin: 8.5 g/dL — ABNORMAL LOW (ref 12.0–15.0)
Lymphocytes Relative: 30 %
Lymphs Abs: 2.8 10*3/uL (ref 0.7–4.0)
MCH: 28.7 pg (ref 26.0–34.0)
MCHC: 32.8 g/dL (ref 30.0–36.0)
MCV: 87.5 fL (ref 78.0–100.0)
Monocytes Absolute: 0.7 10*3/uL (ref 0.1–1.0)
Monocytes Relative: 8 %
Neutro Abs: 5.8 10*3/uL (ref 1.7–7.7)
Neutrophils Relative %: 62 %
Platelets: 606 10*3/uL — ABNORMAL HIGH (ref 150–400)
RBC: 2.96 MIL/uL — ABNORMAL LOW (ref 3.87–5.11)
RDW: 17.5 % — ABNORMAL HIGH (ref 11.5–15.5)
WBC: 9.3 10*3/uL (ref 4.0–10.5)

## 2018-07-24 LAB — COMPREHENSIVE METABOLIC PANEL
ALT: 15 U/L (ref 0–44)
AST: 19 U/L (ref 15–41)
Albumin: 1.6 g/dL — ABNORMAL LOW (ref 3.5–5.0)
Alkaline Phosphatase: 120 U/L (ref 38–126)
Anion gap: 5 (ref 5–15)
BUN: 6 mg/dL (ref 6–20)
CO2: 27 mmol/L (ref 22–32)
Calcium: 8.8 mg/dL — ABNORMAL LOW (ref 8.9–10.3)
Chloride: 111 mmol/L (ref 98–111)
Creatinine, Ser: 0.49 mg/dL (ref 0.44–1.00)
GFR calc Af Amer: >60 mL/min (ref >60)
GFR calc non Af Amer: >60 mL/min (ref >60)
Glucose, Bld: 76 mg/dL (ref 70–99)
Potassium: 4.3 mmol/L (ref 3.5–5.1)
Sodium: 143 mmol/L (ref 135–145)
Total Bilirubin: 0.3 mg/dL (ref 0.3–1.2)
Total Protein: 5.7 g/dL — ABNORMAL LOW (ref 6.5–8.1)

## 2018-07-24 LAB — MAGNESIUM: Magnesium: 1.8 mg/dL (ref 1.7–2.4)

## 2018-07-24 MED ORDER — CALCIUM CARBONATE-VITAMIN D 500-200 MG-UNIT PO TABS: 1.0000 | ORAL_TABLET | Freq: Two times a day (BID) | ORAL | 1 refills | Status: DC

## 2018-07-24 MED ORDER — PREDNISONE 20 MG PO TABS
40.0000 mg | ORAL_TABLET | Freq: Every day | ORAL | Status: DC
  Administered 2018-07-24: 40 mg via ORAL
  Filled 2018-07-24: qty 2

## 2018-07-24 MED ORDER — ACETAMINOPHEN 325 MG PO TABS: 650.0000 mg | ORAL_TABLET | Freq: Four times a day (QID) | ORAL | Status: DC | PRN

## 2018-07-24 MED ORDER — ADULT MULTIVITAMIN W/MINERALS CH: 1.0000 | ORAL_TABLET | Freq: Every day | ORAL | Status: DC

## 2018-07-24 MED ORDER — PREDNISONE 20 MG PO TABS: 40.0000 mg | ORAL_TABLET | Freq: Every day | ORAL | 0 refills | Status: DC

## 2018-07-24 MED ORDER — ENSURE ENLIVE PO LIQD: 237.0000 mL | Freq: Two times a day (BID) | ORAL | 12 refills | Status: DC

## 2018-07-24 MED ORDER — ONDANSETRON 4 MG PO TBDP: 4.0000 mg | ORAL_TABLET | Freq: Three times a day (TID) | ORAL | 0 refills | Status: DC | PRN

## 2018-07-24 NOTE — Progress Notes (Signed)
[facility] Progress Note  Chief Complaint: Ileal Crohn's disease  History:  [place] has had continued improvement in abdominal pain since yesterday.  She has occasional nausea but no vomiting.  Today she is able to keep down solid food, as she was last evening as well.  No diarrhea since yesterday.  She is passing gas.  ROS: Cardiovascular:  no chest pain Respiratory: no dyspnea Fever Objective:  Med list reviewed  Vital signs in last 24 hrs: Vitals:   07/23/18 2029 07/24/18 0454  BP: 123/86 132/87  Pulse: 65 (!) 56  Resp: 16 16  Temp: 98.7 F (37.1 C) 98.7 F (37.1 C)  SpO2: 100% 100%    Physical Exam  Thin with generally poor muscle mass  HEENT: sclera anicteric, oral mucosa moist without lesions  Neck: supple, no thyromegaly, JVD or lymphadenopathy  Cardiac: RRR without murmurs, S1S2 heard, no peripheral edema  Pulm: clear to auscultation bilaterally, normal RR and effort noted  Abdomen: soft, mild RLQ tenderness, with active bowel sounds. No guarding or palpable hepatosplenomegaly  Skin; warm and dry, no jaundice or rash  Recent Labs:  Recent Labs  Lab 07/22/18 0546 07/23/18 0516 07/24/18 0644  WBC 6.0 5.4 9.3  HGB 9.1* 7.9* 8.5*  HCT 28.2* 24.1* 25.9*  PLT 590* 502* 606*   Recent Labs  Lab 07/24/18 0644  NA 143  K 4.3  CL 111  CO2 27  BUN 6  ALBUMIN 1.6*  ALKPHOS 120  ALT 15  AST 19  GLUCOSE 76   No results for input(s): INR in the last 168 hours.  Radiologic studies:  None today  @ASSESSMENTPLANBEGIN @ Assessment:  Newly diagnosed small bowel Crohn's disease, symptoms improved on IV steroids.  She was changed to oral steroids this morning. Severe protein calorie malnutrition with albumin less than 2. Anemia of chronic disease   Plan: She can be discharged home today on prednisone 40 mg once daily Calcium 500 to 600 mg plus vitamin D supplement twice daily. Clinic visit with Dr. Bryan Lemma scheduled in about 2 weeks, and should  be given to the patient with her discharge instructions.   25 minutes were spent face-to-face with the patient. Greater than 50% of the time used for counseling as well as treatment plan and follow-up.    Nelida Meuse III Office: [phone]

## 2018-07-24 NOTE — Care Management (Signed)
[place] information given to pt to make follow up appointment to establish PCP. Marney Doctor RN,BSN [phone]

## 2018-07-24 NOTE — Progress Notes (Signed)
Patient is being discharged home. discharge instructions were given to patient and family 

## 2018-07-24 NOTE — Progress Notes (Signed)
IVs removed X3. AVS reviewed at bedside. Medications reviewed at bedside. No further questions. Pt d/c home.  Kizzie Ide, RN

## 2018-07-24 NOTE — Discharge Summary (Signed)
Physician Discharge Summary  Jennifer Bonham ZOX:[id number] DOB: 06-17-1996 DOA: 07/18/2018  PCP: Ladell Pier, MD  Admit date: 07/18/2018 Discharge date: 07/24/2018  Time spent: 60 minutes  Recommendations for Outpatient Follow-up:  1. Follow-up with Dr. Bryan Lemma 08/05/2018 for follow-up on newly diagnosed Crohn's disease of the small intestine.  On follow-up patient will need a comprehensive metabolic profile done to follow-up on electrolytes renal function, magnesium level checked, and the CBC done.   Discharge Diagnoses:  Principal Problem:   Crohn's disease of small intestine ([place]) Active Problems:   Enteritis-Chronic   FTT (failure to thrive) in adult   Protein calorie malnutrition (HCC)   Chronic diarrhea   Nausea & vomiting-chronic   Normocytic anemia   Generalized weakness   Thrombocytosis (HCC)   Hypokalemia   Malnutrition of moderate degree   Hypomagnesemia   Discharge Condition: Stable and improved  Diet recommendation: Regular  Filed Weights   07/18/18 1206 07/18/18 2016  Weight: 45.1 kg 46.8 kg    History of present illness:  Per Dr. Baron Hamper  Jennifer Cain is a 22 y.o. female with medical history significant of many  months of an unintentional weight loss, abdominal pain, nausea and vomiting.  Patient has had numerous ER visits and she had recently been referred to GI for further work-up.  She was not currently on any medications.  She had been tried on Protonix, Bentyl and a short stent of steroids a few months ago none of which seem to help her symptoms.  The patient was so weak her mother could not get her out of bed which prompted him to bring her into the ED. Per  Report patient had a elevated transglutaminase level with a normal antigliadin IgA antibody.  sHe had a chronic anemia that was stable. She did admit to depressed mood but denied any suicidal ideation.  Stated that she was able to eat however when she eats solids it either results in vomiting  or diarrhea.  She stated she had been able to keep down liquids.   blood work was significant for a albumin of 1.7 .  And hemoglobin of 9.7 .  CT scan showed changes suggestive of enteritis but was similar to prior CT done in April.  ED Course: PA consulted GI,  DR Armbruster who recommended admission for FTT.  They  did not recommend any acute treatment at this time. Dr Posey Pronto accepted for admission.  An IV was unable to be obtained so the patient was given IM Ativan and IM morphine and 1 L IV fluid.  Hospital Course:  1  newly diagnosed Crohn's disease of the small intestine  Patient had presented with weight loss of 90 pounds over the past year, significant nausea vomiting loose stools with poor oral intake.  Patient noted to have diarrhea vomiting and nausea with oral intake with solids.  CT abdomen and pelvis were done that showed abnormal circumferential wall thickening with multiple loops of distal small bowel within the mid and lower abdomen which was compatible with acute enteritis.  Findings similar from CT of 03/16/2018 that also showed enlarged mesenteric adenopathy likely reactive.  Stool studies were done which were negative.  CRP noted at 1.8 and a sed rate of 30.  Patient initially placed on a banana bag as well as IV fluids and followed.  Banana bag subsequently discontinued.  GI consultation obtained and patient subsequently underwent upper endoscopy and colonoscopy with biopsies.  Upper endoscopy which was essentially normal.  Colonoscopy also  appeared normal.  Patient subsequently started on IV Solu-Medrol 40 mg every 12 hours for suspected Crohn's disease well biopsies were pending.  Patient's diet was advanced.  Patient improved clinically with no further nausea or vomiting and diet advanced to a solid diet which she tolerated.  Patient was subsequently transitioned to prednisone 40 mg daily per gastroenterology and recommended patient be discharged on calcium 500 to 600 mg plus vitamin D  twice daily with outpatient follow-up visit with Dr. Bryan Lemma 2 weeks post discharge.  Patient be discharged home in stable and improved condition.  2.  Failure to thrive Secondary to problem #1.  3.  Normocytic anemia Patient noted to be anemic likely secondary to problem #1.  Patient's hemoglobin stabilized at 8.5.  Anemia panel obtain an INR of 34, TIBC of 102, ferritin of 212 and a folate of 7.4.  Outpatient follow-up.  4.  Thrombocytosis Felt likely reactive secondary to problem #1.  Outpatient follow-up.  5.  Moderate protein calorie malnutrition Patient noted to have albumin of 1.8 and a prealbumin of less than 5.  Patient underwent upper endoscopy and colonoscopy as stated above.  Patient was started on nutritional supplementation with Ensure twice daily.  Outpatient follow-up.  6.  Hypokalemia/hypomagnesemia Likely secondary to GI losses.  It by day of discharge.  7.  Sinus tachycardia Patient noted to have a sinus tachycardia on admission felt likely secondary to volume depletion and dehydration.  Tachycardia improved and had resolved with hydration.  Procedures:  CT head 07/18/2018  CT chest 07/18/2018  CT abdomen and pelvis 07/18/2018  Colonoscopy 07/20/2018--- per Dr. Benson Norway  Upper endoscopy 07/20/2018----Per Dr. Benson Norway  Consultations:  Gastroenterology: Dr. Bryan Lemma 07/19/2018   Discharge Exam: Vitals:   07/23/18 2029 07/24/18 0454  BP: 123/86 132/87  Pulse: 65 (!) 56  Resp: 16 16  Temp: 98.7 F (37.1 C) 98.7 F (37.1 C)  SpO2: 100% 100%    General: NAD Cardiovascular: RRR Respiratory: CTAB  Discharge Instructions   Discharge Instructions    Diet general   Complete by:  As directed    Increase activity slowly   Complete by:  As directed      Allergies as of 07/24/2018   No Known Allergies     Medication List    STOP taking these medications   dicyclomine 20 MG tablet Commonly known as:  BENTYL   medroxyPROGESTERone 10 MG tablet Commonly  known as:  PROVERA   pantoprazole 20 MG tablet Commonly known as:  PROTONIX     TAKE these medications   acetaminophen 325 MG tablet Commonly known as:  TYLENOL Take 2 tablets (650 mg total) by mouth every 6 (six) hours as needed for mild pain (or Fever >/= 101).   calcium-vitamin D 500-200 MG-UNIT tablet Commonly known as:  OSCAL WITH D Take 1 tablet by mouth 2 (two) times daily.   feeding supplement (ENSURE ENLIVE) Liqd Take 237 mLs by mouth 2 (two) times daily between meals.   multivitamin with minerals Tabs tablet Take 1 tablet by mouth daily. Start taking on:  07/25/2018   ondansetron 4 MG disintegrating tablet Commonly known as:  ZOFRAN-ODT Take 1 tablet (4 mg total) by mouth every 8 (eight) hours as needed for nausea or vomiting.   predniSONE 20 MG tablet Commonly known as:  DELTASONE Take 2 tablets (40 mg total) by mouth daily with breakfast. Start taking on:  07/25/2018 What changed:  additional instructions      No Known Allergies Follow-up Information  Cirigliano, Vito V, DO. Go on 08/05/2018.   Specialty:  Gastroenterology Why:  Please arrive at the [facility] office at 2:45 for your 3:00 appointment with Dr. Bryan Lemma for follow up  Contact information: [address] [phone]            The results of significant diagnostics from this hospitalization (including imaging, microbiology, ancillary and laboratory) are listed below for reference.    Significant Diagnostic Studies: Ct Abdomen Pelvis Wo Contrast  Result Date: 07/18/2018 CLINICAL DATA:  Initial evaluation for unintended weight loss, generalized abdominal pain. EXAM: CT CHEST, ABDOMEN AND PELVIS WITHOUT CONTRAST TECHNIQUE: Multidetector CT imaging of the chest, abdomen and pelvis was performed following the standard protocol without IV contrast. COMPARISON:  Prior CT from 03/16/2018. FINDINGS: CT CHEST FINDINGS Cardiovascular: Limited noncontrast evaluation of the  intrathoracic aorta are grossly unremarkable. No appreciable aneurysm. Partially visualized great vessels grossly within normal limits. Heart size normal. Decreased density within the cardiac blood pool, suggesting anemia. No pericardial effusion. Mediastinum/Nodes: Visualized thyroid normal. No enlarged mediastinal, hilar, or axillary lymph nodes identified on this noncontrast examination. No mediastinal mass. Limited noncontrast evaluation of the esophagus grossly unremarkable. Lungs/Pleura: Tracheobronchial tree widely patent and intact. Lungs well inflated bilaterally. Lungs are clear without focal infiltrate, pulmonary edema, or pleural effusion. No pneumothorax. No worrisome pulmonary nodule or mass. Musculoskeletal: External soft tissues demonstrate no acute finding. No acute osseous abnormality. No worrisome lytic or blastic osseous lesions. Mild S-shaped thoracolumbar scoliosis noted. CT ABDOMEN PELVIS FINDINGS Hepatobiliary: Mild diffuse hypoattenuation liver suggestive of steatosis. Limited noncontrast evaluation of the liver otherwise unremarkable. Gallbladder within normal limits. No biliary dilatation. Pancreas: Pancreas within normal limits. Spleen: Spleen within normal limits. Adrenals/Urinary Tract: Adrenal glands grossly unremarkable. Kidneys equal in size without evidence for nephrolithiasis or hydronephrosis. No appreciable hydroureter. Bladder moderately distended without acute abnormality. Stomach/Bowel: Stomach within normal limits. No evidence for bowel obstruction. Abnormal wall thickening seen about multiple loops of distal bowel, likely ileum, clustered within the mid and right lower abdomen. Findings consistent with an acute enteritis, which could be either infectious or inflammatory in nature. Finding is similar and may be slightly more pronounced as compared to prior CT. Appendix within normal limits. No other acute inflammatory changes about the bowels.: Grossly within normal limits.  Vascular/Lymphatic: Intra-abdominal aorta of normal caliber. Again seen are multiple enlarged mesenteric lymph nodes, largest of which measures approximately 16 mm (series 2, image 80). Findings are indeterminate, but could be reactive. No other appreciable adenopathy on this noncontrast examination. Reproductive: Uterus and ovaries within normal limits. Other: No free air or fluid. Musculoskeletal: No acute osseous abnormality. No worrisome lytic or blastic osseous lesions. IMPRESSION: 1. Abnormal circumferential wall thickening about multiple loops of distal small bowel within the mid and lower abdomen, compatible with acute enteritis. Findings are similar relative to prior CT from 03/16/2018, and again could be either infectious or inflammatory in nature. Findings are relatively similar as compared to prior CT from 03/16/2018. 2. Enlarged mesenteric adenopathy, likely reactive. Findings also similar to previous. 3. Hepatic steatosis. Electronically Signed   By: Jeannine Boga M.D.   On: 07/18/2018 16:36   Ct Head Wo Contrast  Result Date: 07/18/2018 CLINICAL DATA:  Abdominal pain, unintended weight loss. EXAM: CT HEAD WITHOUT CONTRAST TECHNIQUE: Contiguous axial images were obtained from the base of the skull through the vertex without intravenous contrast. COMPARISON:  None. FINDINGS: BRAIN: No intraparenchymal hemorrhage, mass effect nor midline shift. Mild parenchymal brain volume loss.  No hydrocephalus. No acute large vascular territory infarcts. No abnormal extra-axial fluid collections. Basal cisterns are patent. Empty sella. VASCULAR: Unremarkable. SKULL/SOFT TISSUES: No skull fracture. Persistent metopic suture. No significant soft tissue swelling. ORBITS/SINUSES: The included ocular globes and orbital contents are normal.Trace paranasal sinus mucosal thickening. Mastoid air cells are well aerated. OTHER: None. IMPRESSION: 1. No acute intracranial process. 2. Mild parenchymal brain volume loss.  3. Empty sella. Electronically Signed   By: Elon Alas M.D.   On: 07/18/2018 16:08   Ct Chest Wo Contrast  Result Date: 07/18/2018 CLINICAL DATA:  Initial evaluation for unintended weight loss, generalized abdominal pain. EXAM: CT CHEST, ABDOMEN AND PELVIS WITHOUT CONTRAST TECHNIQUE: Multidetector CT imaging of the chest, abdomen and pelvis was performed following the standard protocol without IV contrast. COMPARISON:  Prior CT from 03/16/2018. FINDINGS: CT CHEST FINDINGS Cardiovascular: Limited noncontrast evaluation of the intrathoracic aorta are grossly unremarkable. No appreciable aneurysm. Partially visualized great vessels grossly within normal limits. Heart size normal. Decreased density within the cardiac blood pool, suggesting anemia. No pericardial effusion. Mediastinum/Nodes: Visualized thyroid normal. No enlarged mediastinal, hilar, or axillary lymph nodes identified on this noncontrast examination. No mediastinal mass. Limited noncontrast evaluation of the esophagus grossly unremarkable. Lungs/Pleura: Tracheobronchial tree widely patent and intact. Lungs well inflated bilaterally. Lungs are clear without focal infiltrate, pulmonary edema, or pleural effusion. No pneumothorax. No worrisome pulmonary nodule or mass. Musculoskeletal: External soft tissues demonstrate no acute finding. No acute osseous abnormality. No worrisome lytic or blastic osseous lesions. Mild S-shaped thoracolumbar scoliosis noted. CT ABDOMEN PELVIS FINDINGS Hepatobiliary: Mild diffuse hypoattenuation liver suggestive of steatosis. Limited noncontrast evaluation of the liver otherwise unremarkable. Gallbladder within normal limits. No biliary dilatation. Pancreas: Pancreas within normal limits. Spleen: Spleen within normal limits. Adrenals/Urinary Tract: Adrenal glands grossly unremarkable. Kidneys equal in size without evidence for nephrolithiasis or hydronephrosis. No appreciable hydroureter. Bladder moderately distended  without acute abnormality. Stomach/Bowel: Stomach within normal limits. No evidence for bowel obstruction. Abnormal wall thickening seen about multiple loops of distal bowel, likely ileum, clustered within the mid and right lower abdomen. Findings consistent with an acute enteritis, which could be either infectious or inflammatory in nature. Finding is similar and may be slightly more pronounced as compared to prior CT. Appendix within normal limits. No other acute inflammatory changes about the bowels.: Grossly within normal limits. Vascular/Lymphatic: Intra-abdominal aorta of normal caliber. Again seen are multiple enlarged mesenteric lymph nodes, largest of which measures approximately 16 mm (series 2, image 80). Findings are indeterminate, but could be reactive. No other appreciable adenopathy on this noncontrast examination. Reproductive: Uterus and ovaries within normal limits. Other: No free air or fluid. Musculoskeletal: No acute osseous abnormality. No worrisome lytic or blastic osseous lesions. IMPRESSION: 1. Abnormal circumferential wall thickening about multiple loops of distal small bowel within the mid and lower abdomen, compatible with acute enteritis. Findings are similar relative to prior CT from 03/16/2018, and again could be either infectious or inflammatory in nature. Findings are relatively similar as compared to prior CT from 03/16/2018. 2. Enlarged mesenteric adenopathy, likely reactive. Findings also similar to previous. 3. Hepatic steatosis. Electronically Signed   By: Jeannine Boga M.D.   On: 07/18/2018 16:36   Dg Femur Min 2 Views Left  Result Date: 07/18/2018 CLINICAL DATA:  Bilateral lower extremity bone pain for months without known injury. EXAM: LEFT FEMUR 2 VIEWS COMPARISON:  Same day CT abdomen and pelvis FINDINGS: Overlapping AP and frog-leg views of the left femur. Joint spaces appear intact.  Femoral head is spherical in appearance and maintained within the acetabular  component. There is no evidence of fracture or other focal bone lesions. Enteric contrast within overlying bowel loops simulate sclerotic lesions of the sacrum. Soft tissues are unremarkable. IMPRESSION: No acute osseous abnormality. Electronically Signed   By: Ashley Royalty M.D.   On: 07/18/2018 16:18   Dg Femur Min 2 Views Right  Result Date: 07/18/2018 CLINICAL DATA:  Bilateral lower extremity bone pain for months. No known injury. EXAM: RIGHT FEMUR 2 VIEWS COMPARISON:  Same day CT AP FINDINGS: Overlapping AP and frog-leg views of the right femur. Intact joint spaces. No fracture or suspicious osseous lesions. Soft tissues are unremarkable. Contrast within bowel loops project over the lower pelvis and sacrum accounting for the areas of hazy increased opacity. No sacral lesion is identified on same day CT. IMPRESSION: No acute osseous abnormality. Electronically Signed   By: Ashley Royalty M.D.   On: 07/18/2018 16:15   [place] Ekg Site Rite  Result Date: 07/19/2018 If Site Rite image not attached, placement could not be confirmed due to current cardiac rhythm.   Microbiology: Recent Results (from the past 240 hour(s))  Calprotectin, Fecal     Status: Abnormal   Collection Time: 07/21/18  1:40 AM  Result Value Ref Range Status   Calprotectin, Fecal 1,013 (H) 0 - 120 ug/g Final    Comment: (NOTE) Concentration     Interpretation   Follow-Up <16 - 50 ug/g     Normal           None >50 -120 ug/g     Borderline       Re-evaluate in 4-6 weeks    >120 ug/g     Abnormal         Repeat as clinically                                   indicated Performed At: [facility] [place], [place] [id number] Rush Farmer MD AQ:[id number]   Gastrointestinal Panel by PCR , Stool     Status: None   Collection Time: 07/21/18  1:40 AM  Result Value Ref Range Status   Campylobacter species NOT DETECTED NOT DETECTED Final   Plesimonas shigelloides NOT DETECTED NOT DETECTED Final   Salmonella  species NOT DETECTED NOT DETECTED Final   Yersinia enterocolitica NOT DETECTED NOT DETECTED Final   Vibrio species NOT DETECTED NOT DETECTED Final   Vibrio cholerae NOT DETECTED NOT DETECTED Final   Enteroaggregative E coli (EAEC) NOT DETECTED NOT DETECTED Final   Enteropathogenic E coli (EPEC) NOT DETECTED NOT DETECTED Final   Enterotoxigenic E coli (ETEC) NOT DETECTED NOT DETECTED Final   Shiga like toxin producing E coli (STEC) NOT DETECTED NOT DETECTED Final   Shigella/Enteroinvasive E coli (EIEC) NOT DETECTED NOT DETECTED Final   Cryptosporidium NOT DETECTED NOT DETECTED Final   Cyclospora cayetanensis NOT DETECTED NOT DETECTED Final   Entamoeba histolytica NOT DETECTED NOT DETECTED Final   Giardia lamblia NOT DETECTED NOT DETECTED Final   Adenovirus F40/41 NOT DETECTED NOT DETECTED Final   Astrovirus NOT DETECTED NOT DETECTED Final   Norovirus GI/GII NOT DETECTED NOT DETECTED Final   Rotavirus A NOT DETECTED NOT DETECTED Final   Sapovirus (I, II, IV, and V) NOT DETECTED NOT DETECTED Final    Comment: Performed at [facility], [address] [place], [place] [address]  Labs: Basic Metabolic Panel: Recent Labs  Lab 07/19/18 0929 07/20/18 0459 07/21/18 0518 07/22/18 0546 07/23/18 0516 07/24/18 0644  NA 138 142 143 140 143 143  K 3.2* 3.9 3.5 4.6 4.1 4.3  CL 104 111 113* 112* 113* 111  CO2 23 24 24 23 24 27   GLUCOSE 66* 77 95 111* 152* 76  BUN 5* <5* 6 5* 7 6  CREATININE 0.38* 0.37* 0.50 0.34* 0.46 0.49  CALCIUM 8.1* 8.0* 7.6* 8.4* 8.3* 8.8*  MG 1.8 1.6* 1.8 1.9 1.8 1.8  PHOS 2.8 3.0 3.6 3.2 3.1  --    Liver Function Tests: Recent Labs  Lab 07/20/18 0459 07/21/18 0518 07/22/18 0546 07/23/18 0516 07/24/18 0644  AST 16 19 18 18 19   ALT 12 13 15 14 15   ALKPHOS 81 101 109 105 120  BILITOT 0.3 <0.1* 0.3 0.3 0.3  PROT 4.7* 4.6* 5.4* 4.9* 5.7*  ALBUMIN 1.3* 1.2* 1.5* 1.3* 1.6*   No results for input(s): LIPASE, AMYLASE in the last 168 hours. No  results for input(s): AMMONIA in the last 168 hours. CBC: Recent Labs  Lab 07/20/18 0459 07/21/18 0518 07/22/18 0546 07/23/18 0516 07/24/18 0644  WBC 5.1 4.2 6.0 5.4 9.3  NEUTROABS 2.0 2.0 3.7 3.5 5.8  HGB 8.1* 7.6* 9.1* 7.9* 8.5*  HCT 24.5* 23.5* 28.2* 24.1* 25.9*  MCV 85.7 86.1 87.3 87.0 87.5  PLT 453* 482* 590* 502* 606*   Cardiac Enzymes: No results for input(s): CKTOTAL, CKMB, CKMBINDEX, TROPONINI in the last 168 hours. BNP: BNP (last 3 results) No results for input(s): BNP in the last 8760 hours.  ProBNP (last 3 results) No results for input(s): PROBNP in the last 8760 hours.  CBG: No results for input(s): GLUCAP in the last 168 hours.     Signed:  Irine Seal MD.  [facility] 07/24/2018, 12:58 PM

## 2018-07-26 ENCOUNTER — Encounter: Payer: Self-pay | Admitting: Internal Medicine

## 2018-07-27 LAB — QUANTIFERON-TB GOLD PLUS: QuantiFERON-TB Gold Plus: UNDETERMINED

## 2018-07-27 LAB — QUANTIFERON-TB GOLD PLUS (RQFGPL)
QuantiFERON Mitogen Value: 0.23 IU/mL
QuantiFERON Nil Value: 0.03 IU/mL
QuantiFERON TB1 Ag Value: 0.02 IU/mL
QuantiFERON TB2 Ag Value: 0.02 IU/mL

## 2018-08-02 ENCOUNTER — Encounter: Payer: Self-pay | Admitting: Internal Medicine

## 2018-08-05 ENCOUNTER — Telehealth: Payer: Self-pay | Admitting: Internal Medicine

## 2018-08-05 ENCOUNTER — Ambulatory Visit: Payer: Self-pay | Admitting: Gastroenterology

## 2018-08-05 NOTE — Telephone Encounter (Signed)
Patient came in and dropped off FMLA paperwork.

## 2018-08-09 ENCOUNTER — Other Ambulatory Visit: Payer: Self-pay

## 2018-08-09 ENCOUNTER — Ambulatory Visit: Payer: Self-pay | Attending: Internal Medicine | Admitting: Internal Medicine

## 2018-08-09 VITALS — BP 133/91 | HR 88 | Temp 98.5°F | Resp 16 | Wt 117.0 lb

## 2018-08-09 DIAGNOSIS — Z7952 Long term (current) use of systemic steroids: Secondary | ICD-10-CM | POA: Insufficient documentation

## 2018-08-09 DIAGNOSIS — D649 Anemia, unspecified: Secondary | ICD-10-CM

## 2018-08-09 DIAGNOSIS — K5000 Crohn's disease of small intestine without complications: Secondary | ICD-10-CM | POA: Insufficient documentation

## 2018-08-09 DIAGNOSIS — Z09 Encounter for follow-up examination after completed treatment for conditions other than malignant neoplasm: Secondary | ICD-10-CM | POA: Insufficient documentation

## 2018-08-09 DIAGNOSIS — R634 Abnormal weight loss: Secondary | ICD-10-CM | POA: Insufficient documentation

## 2018-08-09 DIAGNOSIS — R112 Nausea with vomiting, unspecified: Secondary | ICD-10-CM | POA: Insufficient documentation

## 2018-08-09 DIAGNOSIS — Z79899 Other long term (current) drug therapy: Secondary | ICD-10-CM | POA: Insufficient documentation

## 2018-08-09 DIAGNOSIS — R109 Unspecified abdominal pain: Secondary | ICD-10-CM | POA: Insufficient documentation

## 2018-08-09 DIAGNOSIS — K529 Noninfective gastroenteritis and colitis, unspecified: Secondary | ICD-10-CM

## 2018-08-09 DIAGNOSIS — G8929 Other chronic pain: Secondary | ICD-10-CM | POA: Insufficient documentation

## 2018-08-09 MED ORDER — FERROUS SULFATE 325 (65 FE) MG PO TABS: 325.0000 mg | ORAL_TABLET | Freq: Every day | ORAL | 1 refills | Status: DC

## 2018-08-09 NOTE — Progress Notes (Signed)
Patient ID: Jennifer Cain, female    DOB: [birth year]  MRN: [id number]  CC: paperwork and Hospitalization Follow-up   Subjective: Jennifer Crabbe is a 22 y.o. female who presents for hospital follow-up and completion of FMLA form Her concerns today include:  Since last visit patient was hospitalized 8/29-07/24/2018 for her symptoms of unintentional weight loss, abdominal pain, chronic diarrhea, nausea and vomiting.  Work-up reveals that she has possible Crohn's disease. Patient discharged on prednisone 40 mg daily and was to follow-up with Dr. Bryan Lemma with [facility].  Initially patient told me that she had an appointment scheduled for later this month.  However on further questioning she does not have an appointment.  She turned in her paperwork for the orange card/cone discount but states she received a letter in the mail stating that additional information was needed before her application can be approved.  She does not recall what the additional information needed is. Since hospitalization she is feeling better.  She has regained some of her weight on prednisone.  She reports that the abdominal pain and diarrhea have ceased.  She is wanting to return to work.  She worked Systems analyst at [employer].   Patient Active Problem List   Diagnosis Date Noted  . Crohn's disease of small intestine ([place]) 07/24/2018  . Failure to thrive in adult   . Hypomagnesemia 07/20/2018  . Normocytic anemia 07/19/2018  . Generalized weakness 07/19/2018  . Thrombocytosis ([place]) 07/19/2018  . Hypokalemia 07/19/2018  . Malnutrition of moderate degree 07/19/2018  . Enteritis-Chronic 07/18/2018  . FTT (failure to thrive) in adult 07/18/2018  . Protein calorie malnutrition ([place]) 07/18/2018  . Chronic diarrhea 07/18/2018  . Nausea & vomiting-chronic 07/18/2018     Current Outpatient Medications on File Prior to Visit  Medication Sig Dispense Refill  . acetaminophen (TYLENOL) 325 MG tablet Take 2 tablets  (650 mg total) by mouth every 6 (six) hours as needed for mild pain (or Fever >/= 101).    . calcium-vitamin D (OSCAL 500/200 D-3) 500-200 MG-UNIT tablet Take 1 tablet by mouth 2 (two) times daily. 60 tablet 1  . feeding supplement, ENSURE ENLIVE, (ENSURE ENLIVE) LIQD Take 237 mLs by mouth 2 (two) times daily between meals. 237 mL 12  . Multiple Vitamin (MULTIVITAMIN WITH MINERALS) TABS tablet Take 1 tablet by mouth daily.    . ondansetron (ZOFRAN ODT) 4 MG disintegrating tablet Take 1 tablet (4 mg total) by mouth every 8 (eight) hours as needed for nausea or vomiting. 10 tablet 0  . predniSONE (DELTASONE) 20 MG tablet Take 2 tablets (40 mg total) by mouth daily with breakfast. 60 tablet 0   No current facility-administered medications on file prior to visit.     No Known Allergies  Social History   Socioeconomic History  . Marital status: Single    Spouse name: Not on file  . Number of children: Not on file  . Years of education: Not on file  . Highest education level: Not on file  Occupational History  . Not on file  Social Needs  . Financial resource strain: Not on file  . Food insecurity:    Worry: Not on file    Inability: Not on file  . Transportation needs:    Medical: Not on file    Non-medical: Not on file  Tobacco Use  . Smoking status: Never Smoker  . Smokeless tobacco: Never Used  Substance and Sexual Activity  . Alcohol use: Never  Frequency: Never  . Drug use: Never  . Sexual activity: Not on file  Lifestyle  . Physical activity:    Days per week: Not on file    Minutes per session: Not on file  . Stress: Not on file  Relationships  . Social connections:    Talks on phone: Not on file    Gets together: Not on file    Attends religious service: Not on file    Active member of club or organization: Not on file    Attends meetings of clubs or organizations: Not on file    Relationship status: Not on file  . Intimate partner violence:    Fear of current  or ex partner: Not on file    Emotionally abused: Not on file    Physically abused: Not on file    Forced sexual activity: Not on file  Other Topics Concern  . Not on file  Social History Narrative  . Not on file    Family History  Problem Relation Age of Onset  . GI Disease Neg Hx   . Autoimmune disease Neg Hx     Past Surgical History:  Procedure Laterality Date  . BIOPSY  07/20/2018   Procedure: BIOPSY;  Surgeon: Carol Ada, MD;  Location: [facility];  Service: Endoscopy;;  . COLONOSCOPY WITH PROPOFOL N/A 07/20/2018   Procedure: COLONOSCOPY WITH PROPOFOL;  Surgeon: Carol Ada, MD;  Location: [facility];  Service: Endoscopy;  Laterality: N/A;  . ESOPHAGOGASTRODUODENOSCOPY (EGD) WITH PROPOFOL N/A 07/20/2018   Procedure: ESOPHAGOGASTRODUODENOSCOPY (EGD) WITH PROPOFOL;  Surgeon: Carol Ada, MD;  Location: [facility];  Service: Endoscopy;  Laterality: N/A;    ROS: Review of Systems Negative except as above. PHYSICAL EXAM: BP (!) 133/91   Pulse 88   Temp 98.5 F (36.9 C) (Oral)   Resp 16   Wt 117 lb (53.1 kg)   SpO2 100%   BMI 20.73 kg/m   Wt Readings from Last 3 Encounters:  08/09/18 117 lb (53.1 kg)  07/18/18 103 lb 2.8 oz (46.8 kg)  07/09/18 105 lb 3.2 oz (47.7 kg)   Repeat BP 126/85 Physical Exam  General appearance - alert, well appearing, and in no distress Mental status - normal mood, behavior, speech, dress, motor activity, and thought processes Chest - clear to auscultation, no wheezes, rales or rhonchi, symmetric air entry Heart - normal rate, regular rhythm, normal S1, S2, no murmurs, rubs, clicks or gallops Extremities - peripheral pulses normal, no pedal edema, no clubbing or cyanosis  Lab Results  Component Value Date   WBC 9.3 07/24/2018   HGB 8.5 (L) 07/24/2018   HCT 25.9 (L) 07/24/2018   MCV 87.5 07/24/2018   PLT 606 (H) 07/24/2018    ASSESSMENT AND PLAN:  1. Colitis Patient advised to continue the prednisone until she sees  the gastroenterologist.  I have given her the phone number for his office and recommended that she call today to schedule her follow-up appointment. Advised not to stop prednisone abruptly to avoid adrenal insufficiency. Encouraged her to submit whatever additional information was requested of her so that she can get approved for the orange card/cone discount. FMLA form completed today releasing her to return to work 08/12/2018.  Form will be faxed to her employer  2. Anemia, unspecified type We will placed on iron  Patient was given the opportunity to ask questions.  Patient verbalized understanding of the plan and was able to repeat key elements of the plan.   No  orders of the defined types were placed in this encounter.    Requested Prescriptions   Signed Prescriptions Disp Refills  . ferrous sulfate 325 (65 FE) MG tablet 100 tablet 1    Sig: Take 1 tablet (325 mg total) by mouth daily with breakfast.    Return in about 2 months (around 10/09/2018).  Karle Plumber, MD, FACP

## 2018-08-09 NOTE — Patient Instructions (Addendum)
Please call Dr. Vivia Ewing office today to schedule a follow-up appointment with him.  Contact information: [place]  Do not stop the prednisone abruptly.  If you are unable to get an appointment with the gastroenterologist before you run out, please contact me for a refill on the prednisone.Marland Kitchen

## 2018-08-09 NOTE — Progress Notes (Signed)
HFU/ paperwork1

## 2018-08-14 ENCOUNTER — Telehealth: Payer: Self-pay

## 2018-08-14 NOTE — Telephone Encounter (Signed)
Faxed FMLA on 08/12/2018 and 08/14/2018.  Contacted pt and left a detailed vm informing pt that I have faxed her FMLA and the original copy and transmission log will be up front for her to pick up

## 2018-08-15 ENCOUNTER — Encounter: Payer: Self-pay | Admitting: Internal Medicine

## 2018-08-29 ENCOUNTER — Encounter: Payer: Self-pay | Admitting: Gastroenterology

## 2018-08-29 ENCOUNTER — Ambulatory Visit (INDEPENDENT_AMBULATORY_CARE_PROVIDER_SITE_OTHER): Payer: Self-pay | Admitting: Gastroenterology

## 2018-08-29 VITALS — BP 136/78 | HR 74 | Ht 63.0 in | Wt 125.0 lb

## 2018-08-29 DIAGNOSIS — K50919 Crohn's disease, unspecified, with unspecified complications: Secondary | ICD-10-CM

## 2018-08-29 MED ORDER — PREDNISONE 5 MG PO TABS: 5.0000 mg | ORAL_TABLET | ORAL | 0 refills | Status: AC

## 2018-08-29 NOTE — Progress Notes (Signed)
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  Initial presentation as below: 22 year old female with a several month history of abdominal pain, nausea, nonbloody emesis, loose/watery nonbloody stools admitted on 07/19/2018 with failure to thrive.  She had an 80 pound weight loss over the last year.  She has been evaluated for symptoms as an outpatient without any improvement with trials of Protonix, Zofran, Bentyl and a short course of steroids.  Outpatient evaluation was notable for negative QuantiFERON gold, gliadin, TTG IgA, ANA, TSH, lipase, FOBT, HIV.  CBC on 06/20/2018 notable for WBC 4.4 hemoglobin/hematocrit 10/30 platelets 235 with MCV/RDW 85/15.  She has had stable normocytic anemia since at least May.  Iron studies in May notable for ferritin 212, iron 34, TIBC 102, iron sat 33% with B12/folate 849/7.4.  CT in April similar to admission CT in August which demonstrates mild hypoattenuation of the liver without biliary dilatation and a normal-appearing gallbladder, pancreas, spleen.  There is ileal thickening in the mid/right lower quadrant with multiple small mesenteric lymph nodes, with the largest measuring 16 mm.  Calpro: 1,013.  No preceding travel, antibiotics, hospitalizations, trauma, new medications.  No prior EGD or colonoscopy.  EGD was normal with normal duodenal biopsies.  Colonoscopy was notable for normal-appearing colonic lumen/mucosa, TI not fully intubated but was normal-appearing with biopsies demonstrating acute ileitis without chronic changes.  Colonic biopsies with acute colitis without chronic changes.  She had swift clinical improvement with IV steroids and was discharged home with prednisone 40 mg daily.  She missed her follow-up appointment.  HPI:     Patient is a 22 y.o. female with recently diagnosed inflammatory small bowel Crohn's disease [facility] for  follow-up.   Since hospital discharge, she reports continued clinical improvement with improved p.o. tolerance/intake.  She has intentionally increased weight by 30# since hospital d/c. She continues to take prednisone 40 mg daily. Taking Os-Cal-D. 2 formed, non-bloody stools daily. No n/v/f/c. No EIMs.   Quant Gold negative Aug 2019, indeterminate 07/2018 though.   IBD History:  [place] Molder is a 22 y.o. female with a history of newly diagnosed small bowel Crohns Disease.    Evaluation to date:  - TPMT: Ordered today - TB testing: Quant Gold negative Aug 2019, indeterminate 07/2018 though - HBV status: Ordered today - Pertinent Imaging: CT in April 2019 and Aug 2019 (at time of admission) both demonstrates mild hypoattenuation of the liver without biliary dilatation and a normal-appearing gallbladder, pancreas, spleen.  There is ileal thickening in the mid/right lower quadrant with multiple small mesenteric lymph nodes, with the largest measuring 16 mm - Last colonoscopy: Aug 2019 (index) - Last EGD: Aug 2019 (index) - Small bowel imaging: None to date - History of EIMs: None  Medications to date:  Prednisone Aug 2019-current  Health Maintenance:  - DEXA: To schedule 6 months after steroid wean - Vaccinations:      - Annual Flu Vaccine - To schedule at f/u appt      - Pneumococcal Vaccine if receiving immunosuppression: - To schedule at f/u appt; PCV13 followed by a dose of PPSV23 after 2-12 months    - Micronutrient eval: Will do in 6-12 months       - Annual Vit D, B6, iron panel       - Ileal disease: B12, fat soluble vitamins       - Restrictive diet/wt loss:  B1, selenium, fat soluble vitamins, zinc, B12   - Annual Derm eval: will place referral at f/u appt. Discussed today  - Annual Pap (if immunosuppressed): To schedule f/u with PCM/Gyn - Surveillance colonoscopy: Not indicated yet - Surveillance labs for immunomodulators: Patient likely to choose biologic - Annual depression  screening: Denies  Endoscopic Hx: -EGD (06/2018): Normal with normal duodenal biopsies -Colonoscopy (06/2018): Normal-appearing colon but with acute colitis without chronic changes on biopsies.  TI not fully intubated but on limited views appeared normal with biopsies demonstrating acute ileitis without chronic changes  Review of systems:     No chest pain, no SOB, no fevers, no urinary sx   History reviewed. No pertinent past medical history.  Patient's surgical history, family medical history, social history, medications and allergies were all reviewed in Epic    Current Outpatient Medications  Medication Sig Dispense Refill  . acetaminophen (TYLENOL) 325 MG tablet Take 2 tablets (650 mg total) by mouth every 6 (six) hours as needed for mild pain (or Fever >/= 101).    . feeding supplement, ENSURE ENLIVE, (ENSURE ENLIVE) LIQD Take 237 mLs by mouth 2 (two) times daily between meals. 237 mL 12  . ferrous sulfate 325 (65 FE) MG tablet Take 1 tablet (325 mg total) by mouth daily with breakfast. 100 tablet 1  . Multiple Vitamin (MULTIVITAMIN WITH MINERALS) TABS tablet Take 1 tablet by mouth daily.    . ondansetron (ZOFRAN ODT) 4 MG disintegrating tablet Take 1 tablet (4 mg total) by mouth every 8 (eight) hours as needed for nausea or vomiting. 10 tablet 0  . predniSONE (DELTASONE) 20 MG tablet Take 2 tablets (40 mg total) by mouth daily with breakfast. 60 tablet 0  . calcium-vitamin D (OSCAL 500/200 D-3) 500-200 MG-UNIT tablet Take 1 tablet by mouth 2 (two) times daily. 60 tablet 1   No current facility-administered medications for this visit.     Physical Exam:     BP 136/78   Pulse 74   Ht 5' 3"  (1.6 m)   Wt 125 lb (56.7 kg)   BMI 22.14 kg/m   GENERAL:  Pleasant female in NAD PSYCH: : Cooperative, normal affect EENT:  conjunctiva pink, mucous membranes moist, neck supple without masses CARDIAC:  RRR, no murmur heard, no peripheral edema PULM: Normal respiratory effort, lungs CTA  bilaterally, no wheezing ABDOMEN:  Nondistended, soft, nontender. No obvious masses, no hepatomegaly,  normal bowel sounds SKIN:  turgor, no lesions seen Musculoskeletal:  Normal muscle tone, normal strength NEURO: Alert and oriented x 3, no focal neurologic deficits   IMPRESSION and PLAN:    #1.  Crohn's disease: Newly diagnosed small bowel Crohn's disease.  Acute colitis and ileitis noted on colonoscopy without macroscopic changes.  Discussed her diagnosis at length to include pathophysiology, medications, risk of progression of disease, complications of Crohn's disease.  We had an in-depth discussion of ongoing medical management and she would like to proceed with biologic therapy, but unsure if she would proceed with Entyvio versus Remicade, and would like a few days to read about it and get back to be on a decision.  Otherwise will proceed as below:  - Labs today: CBC, CMP -Repeat colonoscopy Colo in 6 months after starting biologic - Call wihtin 1 week to select Entyvio vs Remicade -Start steroid taper today  - HBV, HCV check in anticipation of starting biologic therapy - TPMT check in the event of need for immunomodulator in the future - DEXA 6 months after  steroids complete - Micronutrient eval in 6-12 months - Repeat CT in 3-6 months to ensure improvement and resolution of lymphadenopathy - Directed to [email or web address] - RTC in 1 month -We will review immunizations at follow-up -Stressed the importance of adhering to regular follow-up for this chronic diagnosis        Lavena Bullion ,DO, FACG 08/29/2018, 10:27 AM

## 2018-08-29 NOTE — Patient Instructions (Addendum)
If you are age 22 or older, your body mass index should be between 23-30. Your Body mass index is 22.14 kg/m. If this is out of the aforementioned range listed, please consider follow up with your Primary Care Provider.  If you are age 22 or younger, your body mass index should be between 19-25. Your Body mass index is 22.14 kg/m. If this is out of the aformentioned range listed, please consider follow up with your Primary Care Provider.   We have sent the following medications to your pharmacy for you to pick up at your convenience: Prednisone 5mg  tablet 30mg  by mouth once daily x 1 week. 20mg  by mouth once daily x 1 week. 15mg  by mouth once daily x 1 week. 10mg  by mouth once daily x 1 week. 5mg  by mouth once daily x 1 week. Then Stop.  Visit the website [email or web address] for information on Crohn's Disease.  Call Dr. Vivia Ewing nurse Barbera Setters at [phone] in one week to select Remicade or Entyvio as your treatment option. If you have any question regarding the medications or Crohn's give [place] a call.   Ulcerative Colitis, Adult Ulcerative colitis is long-lasting (chronic) swelling (inflammation) of the large intestine (colon). Sores (ulcers) may also form on the colon. Ulcerative colitis is closely related to another condition of inflammation of the intestines that is called Crohn disease. Together, they are frequently referred to as inflammatory bowel disease (IBD). What are the causes? Ulcerative colitis is caused by increased activity of the immune system in the intestines. The immune system is the system that protects the body against harmful bacteria, viruses, fungi, and other things that can make you sick. When the immune system overacts, it causes inflammation. The cause of the increased immune system activity is not known. What increases the risk? Risk factors of ulcerative colitis include:  Age. This includes: ? Being 37-50 years old. ? Being older than 22 years old.  Having a  family history of ulcerative colitis.  Being of Jewish descent.  What are the signs or symptoms? Common symptoms of ulcerative colitis include rectal bleeding and diarrhea. There is a wide range of symptoms, and a person's symptoms depend on how severe the condition is. Additional symptoms may include:  Pain or cramping in the belly (abdomen).  Fever.  Fatigue.  Weight loss.  Night sweats.  Rectal pain.  Feeling the immediate need to have a bowel movement.  Nausea.  Loss of appetite.  Anemia.  Joint pain or soreness.  Eye irritation.  Certain skin rashes.  How is this diagnosed? Ulcerative colitis may be diagnosed by:  Medical history and physical exam.  Blood tests and stool tests.  X-rays.  CT scans.  Colonoscopy. For this test, a flexible tube is inserted into your anus and your colon is examined.  Examination of a tissue sample from your colon (biopsy).  How is this treated? Treatment for ulcerative colitis may include medicines to:  Decrease inflammation.  Control your immune system.  Surgery may also be necessary. Follow these instructions at home: Medicines and vitamins  Take medicines only as directed by your doctor. Do not take aspirin.  Ask your doctor if you should take any vitamins or supplements. Lifestyle  Exercise regularly.  Limit alcohol intake to no more than 1 drink per day for nonpregnant women and 2 drinks per day for men. One drink equals 12 ounces of beer, 5 ounces of wine, or 1 ounces of hard liquor. Eating and drinking  Drink enough fluid to  keep your urine clear or pale yellow.  Ask your health care provider about the best diet for you. Follow the diet as directed by your health care provider. This may include: ? Avoiding carbonated drinks. ? Avoiding popcorn, vegetable skins, nuts, and other high-fiber foods when you have symptoms of ulcerative colitis. ? Eating smaller meals more often. ? Keeping a food diary.  This may help you to find and avoid any foods that make you feel not well.  Limit your caffeine intake. General instructions  Keep all follow-up appointments as directed by your health care provider. This is important. Contact a health care provider if:  Your symptoms do not improve or get worse with treatment.  You continue to lose weight.  You have constant cramps or loose bowels.  You develop a new skin rash, skin sores, or eye problems.  You have a fever or chills. Get help right away if:  You have bloody diarrhea.  You have severe pain in your abdomen.  You vomit. This information is not intended to replace advice given to you by your health care provider. Make sure you discuss any questions you have with your health care provider. Document Released: 08/16/2005 Document Revised: 07/09/2016 Document Reviewed: 03/01/2015 Elsevier Interactive Patient Education  [employer].  It was a pleasure to see you today!  Vito Cirigliano, D.O.

## 2018-08-30 ENCOUNTER — Telehealth: Payer: Self-pay | Admitting: Gastroenterology

## 2018-08-30 NOTE — Telephone Encounter (Signed)
Re-sent Rx to patient's pharmacy.

## 2018-10-02 ENCOUNTER — Ambulatory Visit: Payer: Self-pay | Admitting: Gastroenterology

## 2019-03-20 ENCOUNTER — Encounter: Payer: Self-pay | Admitting: Internal Medicine

## 2020-10-12 ENCOUNTER — Encounter (HOSPITAL_BASED_OUTPATIENT_CLINIC_OR_DEPARTMENT_OTHER): Payer: Self-pay

## 2020-10-12 ENCOUNTER — Other Ambulatory Visit: Payer: Self-pay

## 2020-10-12 ENCOUNTER — Emergency Department (HOSPITAL_BASED_OUTPATIENT_CLINIC_OR_DEPARTMENT_OTHER): Payer: Self-pay

## 2020-10-12 ENCOUNTER — Emergency Department (HOSPITAL_BASED_OUTPATIENT_CLINIC_OR_DEPARTMENT_OTHER)
Admission: EM | Admit: 2020-10-12 | Discharge: 2020-10-12 | Disposition: A | Discharge status: Home / Self Care | Payer: Self-pay | Attending: Emergency Medicine | Admitting: Emergency Medicine

## 2020-10-12 ENCOUNTER — Encounter: Payer: Self-pay | Admitting: Internal Medicine

## 2020-10-12 DIAGNOSIS — R519 Headache, unspecified: Secondary | ICD-10-CM | POA: Insufficient documentation

## 2020-10-12 DIAGNOSIS — R1084 Generalized abdominal pain: Secondary | ICD-10-CM

## 2020-10-12 DIAGNOSIS — R112 Nausea with vomiting, unspecified: Secondary | ICD-10-CM

## 2020-10-12 DIAGNOSIS — K5090 Crohn's disease, unspecified, without complications: Secondary | ICD-10-CM | POA: Insufficient documentation

## 2020-10-12 LAB — COMPREHENSIVE METABOLIC PANEL
ALT: 15 U/L (ref 0–44)
AST: 18 U/L (ref 15–41)
Albumin: 3.7 g/dL (ref 3.5–5.0)
Alkaline Phosphatase: 47 U/L (ref 38–126)
Anion gap: 11 (ref 5–15)
BUN: 11 mg/dL (ref 6–20)
CO2: 23 mmol/L (ref 22–32)
Calcium: 9 mg/dL (ref 8.9–10.3)
Chloride: 102 mmol/L (ref 98–111)
Creatinine, Ser: 0.81 mg/dL (ref 0.44–1.00)
GFR, Estimated: >60 mL/min (ref >60)
Glucose, Bld: 72 mg/dL (ref 70–99)
Potassium: 3.9 mmol/L (ref 3.5–5.1)
Sodium: 136 mmol/L (ref 135–145)
Total Bilirubin: 0.2 mg/dL — ABNORMAL LOW (ref 0.3–1.2)
Total Protein: 8.4 g/dL — ABNORMAL HIGH (ref 6.5–8.1)

## 2020-10-12 LAB — PREGNANCY, URINE: Preg Test, Ur: NEGATIVE

## 2020-10-12 LAB — CBC WITH DIFFERENTIAL/PLATELET
Abs Immature Granulocytes: 0.01 10*3/uL (ref 0.00–0.07)
Basophils Absolute: 0 10*3/uL (ref 0.0–0.1)
Basophils Relative: 0 %
Eosinophils Absolute: 0.2 10*3/uL (ref 0.0–0.5)
Eosinophils Relative: 2 %
HCT: 39.8 % (ref 36.0–46.0)
Hemoglobin: 13.2 g/dL (ref 12.0–15.0)
Immature Granulocytes: 0 %
Lymphocytes Relative: 40 %
Lymphs Abs: 3 10*3/uL (ref 0.7–4.0)
MCH: 29.5 pg (ref 26.0–34.0)
MCHC: 33.2 g/dL (ref 30.0–36.0)
MCV: 89 fL (ref 80.0–100.0)
Monocytes Absolute: 0.6 10*3/uL (ref 0.1–1.0)
Monocytes Relative: 7 %
Neutro Abs: 3.8 10*3/uL (ref 1.7–7.7)
Neutrophils Relative %: 51 %
Platelets: 288 10*3/uL (ref 150–400)
RBC: 4.47 MIL/uL (ref 3.87–5.11)
RDW: 14 % (ref 11.5–15.5)
WBC: 7.6 10*3/uL (ref 4.0–10.5)
nRBC: 0 % (ref 0.0–0.2)

## 2020-10-12 LAB — URINALYSIS, ROUTINE W REFLEX MICROSCOPIC
Bilirubin Urine: NEGATIVE
Glucose, UA: NEGATIVE mg/dL
Hgb urine dipstick: NEGATIVE
Ketones, ur: NEGATIVE mg/dL
Leukocytes,Ua: NEGATIVE
Nitrite: NEGATIVE
Protein, ur: NEGATIVE mg/dL
Specific Gravity, Urine: 1.02 (ref 1.005–1.030)
pH: 6.5 (ref 5.0–8.0)

## 2020-10-12 LAB — C-REACTIVE PROTEIN: CRP: 1.4 mg/dL — ABNORMAL HIGH (ref <1.0)

## 2020-10-12 LAB — SEDIMENTATION RATE: Sed Rate: 34 mm/hr — ABNORMAL HIGH (ref 0–22)

## 2020-10-12 LAB — LIPASE, BLOOD: Lipase: 20 U/L (ref 11–51)

## 2020-10-12 MED ORDER — ONDANSETRON 4 MG PO TBDP: ORAL_TABLET | ORAL | 0 refills | Status: DC

## 2020-10-12 MED ORDER — PREDNISONE 10 MG PO TABS: ORAL_TABLET | ORAL | 0 refills | Status: DC

## 2020-10-12 MED ORDER — MORPHINE SULFATE (PF) 4 MG/ML IV SOLN: 4.0000 mg | Freq: Once | INTRAVENOUS | Status: DC

## 2020-10-12 MED ORDER — PREDNISONE 20 MG PO TABS
40.0000 mg | ORAL_TABLET | Freq: Once | ORAL | Status: AC
  Administered 2020-10-12: 40 mg via ORAL
  Filled 2020-10-12: qty 2

## 2020-10-12 MED ORDER — ONDANSETRON HCL 4 MG/2ML IJ SOLN
4.0000 mg | Freq: Once | INTRAMUSCULAR | Status: AC
  Administered 2020-10-12: 4 mg via INTRAVENOUS
  Filled 2020-10-12: qty 2

## 2020-10-12 MED ORDER — SODIUM CHLORIDE 0.9 % IV BOLUS
1000.0000 mL | Freq: Once | INTRAVENOUS | Status: AC
  Administered 2020-10-12: 1000 mL via INTRAVENOUS

## 2020-10-12 MED ORDER — IOHEXOL 300 MG/ML  SOLN
100.0000 mL | Freq: Once | INTRAMUSCULAR | Status: AC | PRN
  Administered 2020-10-12: 100 mL via INTRAVENOUS

## 2020-10-12 NOTE — Discharge Instructions (Addendum)
Please take prednisone taper as directed to help with Crohn's flare.  You can also use Zofran as needed for nausea and vomiting.  Tylenol as needed for pain.  Avoid NSAIDs.  Dr. Bryan Lemma recommended eating a low residue diet avoiding high-fiber foods like breads, cereals, nuts, seeds, raw or dried fruits and leafy vegetables.   GI should reach out to you to schedule close follow-up but if you do not hear from them the next week please call to schedule follow-up appointment for further management of your Crohn's disease.

## 2020-10-12 NOTE — ED Provider Notes (Signed)
[facility] EMERGENCY DEPARTMENT Provider Note   CSN: [id number] Arrival date & time: 10/12/20  1320     History Chief Complaint  Patient presents with  . Abdominal Pain    Jennifer Cain is a 24 y.o. female.  Jennifer Rikard is a 24 y.o. female with a history of Crohn's disease, who presents to the emergency department for evaluation of abdominal pain, nausea, vomiting and headaches.  Patient states symptoms have been going on for about 2 weeks, she states that she has had intermittent abdominal pains in particular after she eats, and then she has episodes of vomiting.  She has been able to keep down fluids but is typically been vomiting 1-2 times daily.  Last vomited yesterday when she tried to eat some chicken.  Denies any hematemesis.  States that she is having normal bowel movements, no melena or hematochezia.  Denies any dysuria or urinary frequency.  Menstrual cycle ended a few days ago and was typical, no vaginal discharge.  No fevers or chills.  Patient initially denied any past medical history including history of abdominal issues or surgeries but on further questioning patient does think that she was diagnosed with Crohn's, looks like she was seen by Dr. Bryan Lemma back in 2019 after hospitalization when this diagnosis was made, they started her on a steroid taper and were planning to start her on Remicade or Entyvio but then it looks like she was lost to follow-up, she states she is not seen GI doctors in a few years.  She is not currently on any medication for management of her Crohn's.  She states that she has been having intermittent right-sided headaches, no headache currently, thinks these may be from not being able to eat and drink much.  No visual changes, numbness weakness or tingling.  Headaches seem to come and go and are not sudden in onset.        History reviewed. No pertinent past medical history.  Patient Active Problem List   Diagnosis Date Noted  .  Colitis 08/09/2018  . Crohn's disease of small intestine ([place]) 07/24/2018  . Failure to thrive in adult   . Hypomagnesemia 07/20/2018  . Normocytic anemia 07/19/2018  . Generalized weakness 07/19/2018  . Thrombocytosis 07/19/2018  . Hypokalemia 07/19/2018  . Malnutrition of moderate degree 07/19/2018  . Enteritis-Chronic 07/18/2018  . FTT (failure to thrive) in adult 07/18/2018  . Protein calorie malnutrition ([place]) 07/18/2018  . Chronic diarrhea 07/18/2018  . Nausea & vomiting-chronic 07/18/2018    Past Surgical History:  Procedure Laterality Date  . BIOPSY  07/20/2018   Procedure: BIOPSY;  Surgeon: Carol Ada, MD;  Location: [facility];  Service: Endoscopy;;  . COLONOSCOPY WITH PROPOFOL N/A 07/20/2018   Procedure: COLONOSCOPY WITH PROPOFOL;  Surgeon: Carol Ada, MD;  Location: [facility];  Service: Endoscopy;  Laterality: N/A;  . ESOPHAGOGASTRODUODENOSCOPY (EGD) WITH PROPOFOL N/A 07/20/2018   Procedure: ESOPHAGOGASTRODUODENOSCOPY (EGD) WITH PROPOFOL;  Surgeon: Carol Ada, MD;  Location: [facility];  Service: Endoscopy;  Laterality: N/A;     OB History   No obstetric history on file.     Family History  Problem Relation Age of Onset  . GI Disease Neg Hx   . Autoimmune disease Neg Hx     Social History   Tobacco Use  . Smoking status: Never Smoker  . Smokeless tobacco: Never Used  Vaping Use  . Vaping Use: Never used  Substance Use Topics  . Alcohol use: Never  . Drug use: Never  Home Medications Prior to Admission medications   Medication Sig Start Date End Date Taking? Authorizing Provider  acetaminophen (TYLENOL) 325 MG tablet Take 2 tablets (650 mg total) by mouth every 6 (six) hours as needed for mild pain (or Fever >/= 101). 07/24/18   Eugenie Filler, MD  calcium-vitamin D (OSCAL 500/200 D-3) 500-200 MG-UNIT tablet Take 1 tablet by mouth 2 (two) times daily. 07/24/18 08/23/18  Eugenie Filler, MD  feeding supplement, ENSURE ENLIVE, (ENSURE  ENLIVE) LIQD Take 237 mLs by mouth 2 (two) times daily between meals. 07/24/18   Eugenie Filler, MD  ferrous sulfate 325 (65 FE) MG tablet Take 1 tablet (325 mg total) by mouth daily with breakfast. 08/09/18   Ladell Pier, MD  Multiple Vitamin (MULTIVITAMIN WITH MINERALS) TABS tablet Take 1 tablet by mouth daily. 07/25/18   Eugenie Filler, MD  ondansetron (ZOFRAN ODT) 4 MG disintegrating tablet Take 1 tablet (4 mg total) by mouth every 8 (eight) hours as needed for nausea or vomiting. 07/24/18   Eugenie Filler, MD    Allergies    Patient has no known allergies.  Review of Systems   Review of Systems  Constitutional: Negative for chills and fever.  HENT: Negative.   Eyes: Negative for visual disturbance.  Respiratory: Negative for cough and shortness of breath.   Cardiovascular: Negative for chest pain.  Gastrointestinal: Positive for abdominal pain, nausea and vomiting. Negative for blood in stool, constipation and diarrhea.  Genitourinary: Negative for dysuria and frequency.  Musculoskeletal: Negative for arthralgias and myalgias.  Skin: Negative for color change and rash.  Neurological: Negative for dizziness, syncope and light-headedness.  All other systems reviewed and are negative.   Physical Exam Updated Vital Signs BP (!) 155/92 (BP Location: Right Arm)   Pulse 72   Temp 98.2 F (36.8 C) (Oral)   Resp 19   Ht 5' 3" (1.6 m)   Wt 82.1 kg   LMP 10/09/2020   SpO2 100%   BMI 32.06 kg/m   Physical Exam Vitals and nursing note reviewed.  Constitutional:      General: She is not in acute distress.    Appearance: She is well-developed. She is not ill-appearing or diaphoretic.     Comments: Well-appearing and in no distress  HENT:     Head: Normocephalic and atraumatic.     Mouth/Throat:     Comments: Mucous membranes slightly dry Eyes:     General:        Right eye: No discharge.        Left eye: No discharge.     Pupils: Pupils are equal, round, and  reactive to light.  Cardiovascular:     Rate and Rhythm: Normal rate and regular rhythm.     Heart sounds: Normal heart sounds.  Pulmonary:     Effort: Pulmonary effort is normal. No respiratory distress.     Breath sounds: Normal breath sounds. No wheezing or rales.     Comments: Respirations equal and unlabored, patient able to speak in full sentences, lungs clear to auscultation bilaterally Abdominal:     General: Bowel sounds are normal. There is no distension.     Palpations: Abdomen is soft. There is no mass.     Tenderness: There is abdominal tenderness in the right upper quadrant. There is no guarding.     Comments: Abdomen is soft, nondistended, bowel sounds present throughout, there is some tenderness in the right upper quadrant, mild right lower quadrant  tenderness, all other quadrants nontender, no guarding or peritoneal signs noted, negative Murphy sign  Musculoskeletal:        General: No deformity.     Cervical back: Neck supple.  Skin:    General: Skin is warm and dry.     Capillary Refill: Capillary refill takes less than 2 seconds.  Neurological:     Mental Status: She is alert and oriented to person, place, and time.     Coordination: Coordination normal.     Comments: Speech is clear, able to follow commands Moves extremities without ataxia, coordination intact  Psychiatric:        Mood and Affect: Mood normal.        Behavior: Behavior normal.     ED Results / Procedures / Treatments   Labs (all labs ordered are listed, but only abnormal results are displayed) Labs Reviewed  COMPREHENSIVE METABOLIC PANEL - Abnormal; Notable for the following components:      Result Value   Total Protein 8.4 (*)    Total Bilirubin 0.2 (*)    All other components within normal limits  LIPASE, BLOOD  CBC WITH DIFFERENTIAL/PLATELET  URINALYSIS, ROUTINE W REFLEX MICROSCOPIC  PREGNANCY, URINE    EKG None  Radiology CT ABDOMEN PELVIS W CONTRAST  Result Date:  10/12/2020 CLINICAL DATA:  Abdomen pain history of Crohn's EXAM: CT ABDOMEN AND PELVIS WITH CONTRAST TECHNIQUE: Multidetector CT imaging of the abdomen and pelvis was performed using the standard protocol following bolus administration of intravenous contrast. CONTRAST:  152m OMNIPAQUE IOHEXOL 300 MG/ML  SOLN COMPARISON:  CT 07/18/2018 FINDINGS: Lower chest: Lung bases demonstrate no acute consolidation or effusion. Normal cardiac size. Hepatobiliary: No focal liver abnormality is seen. No gallstones, gallbladder wall thickening, or biliary dilatation. Pancreas: Unremarkable. No pancreatic ductal dilatation or surrounding inflammatory changes. Spleen: Normal in size without focal abnormality. Adrenals/Urinary Tract: Adrenal glands are unremarkable. Kidneys are normal, without renal calculi, focal lesion, or hydronephrosis. Bladder is unremarkable. Stomach/Bowel: The stomach is nonenlarged. No dilated small bowel. Mild wall thickening and mucosal enhancement of terminal ileum and distal small bowel loops in the pelvis. Negative for colon wall thickening. Negative appendix. Vascular/Lymphatic: No significant vascular findings are present. No enlarged abdominal or pelvic lymph nodes. Reproductive: Uterus and bilateral adnexa are unremarkable. Other: No abdominal wall hernia or abnormality. No abdominopelvic ascites. Musculoskeletal: No acute or significant osseous findings. IMPRESSION: Mild wall thickening and mucosal enhancement of terminal ileum and distal small bowel loops in the pelvis, suspect for enteritis and presumably corresponding to history of Crohn's disease. No evidence for perforation or abscess. Electronically Signed   By: KDonavan FoilM.D.   On: 10/12/2020 15:37    Procedures Procedures (including critical care time)  Medications Ordered in ED Medications  morphine 4 MG/ML injection 4 mg (4 mg Intravenous Not Given 10/12/20 1525)  sodium chloride 0.9 % bolus 1,000 mL (0 mLs Intravenous  Stopped 10/12/20 1625)  ondansetron (ZOFRAN) injection 4 mg (4 mg Intravenous Given 10/12/20 1521)  iohexol (OMNIPAQUE) 300 MG/ML solution 100 mL (100 mLs Intravenous Contrast Given 10/12/20 1501)  predniSONE (DELTASONE) tablet 40 mg (40 mg Oral Given 10/12/20 1615)    ED Course  I have reviewed the triage vital signs and the nursing notes.  Pertinent labs & imaging results that were available during my care of the patient were reviewed by me and considered in my medical decision making (see chart for details).    MDM Rules/Calculators/A&P  24 year old female presents with 2 weeks of intermittent abdominal pain with nausea and vomiting, she states that she has had vomiting anytime she tries to eat solid food, has been able to keep down liquids.  No fevers.  States that she has been having some intermittent headaches which she thinks are related to not being able to eat and drink as much.  Headaches are right-sided, mild and seem to resolve on their own.  Patient initially reported having no history of GI symptoms, but on chart review patient was diagnosed with Crohn's disease 2 years ago and it appears that after being treated with a steroid taper she never followed up with GI to be started on maintenance therapy.  Concern that this could likely be the cause of her intermittent abdominal pains.  Will evaluate with labs and CT abdomen pelvis.  Fluids, pain and nausea medicine given.  Lab work is overall very reassuring, no leukocytosis normal hemoglobin, no significant electrolyte derangements and normal renal and liver function, normal lipase.  Negative pregnancy and urinalysis without signs of infection.  CT with mild wall thickening and mucosal enhancement of the terminal ileum and distal small bowel loops in the pelvis suggestive of enteritis, likely corresponding to patient's history of Crohn's disease, no evidence of perforation or abscess.  Given CT findings will  consult with GI as I suspect patient is likely having Crohn's flare.  Case discussed with Dr. Bryan Lemma with [facility], he recommends putting the patient on steroid taper starting with 40 mg of prednisone daily for 2 weeks and then decreasing by 10 mg each week.  He will work on getting the patient in to be seen quickly.  Request that we send off an ESR and CRP to help with the evaluation when she follows up in office.  I discussed this plan with the patient who is now feeling much better, tolerating p.o. here in the emergency department.  She has been discharged home with instructions for prednisone taper as well as Zofran as needed for nausea.  Strict return precautions discussed.  Patient expresses understanding and agreement with plan.  Final Clinical Impression(s) / ED Diagnoses Final diagnoses:  Exacerbation of Crohn's disease without complication ([place])  Generalized abdominal pain  Non-intractable vomiting with nausea, unspecified vomiting type    Rx / DC Orders ED Discharge Orders         Ordered    ondansetron (ZOFRAN ODT) 4 MG disintegrating tablet        10/12/20 1658    predniSONE (DELTASONE) 10 MG tablet        10/12/20 1658           Benedetto Goad [place], [place] 10/13/20 0029    Lajean Saver, MD 10/13/20 [phone]

## 2020-10-12 NOTE — ED Notes (Signed)
Pt not in room, will update vitals when Pt returnes

## 2020-10-12 NOTE — ED Triage Notes (Signed)
Pt c/o right side abd pain, n/v, HA x 2 days-NAD-steady gait

## 2020-10-12 NOTE — ED Notes (Signed)
Patient transported to CT 

## 2020-11-04 ENCOUNTER — Ambulatory Visit: Payer: Self-pay | Admitting: Physician Assistant

## 2020-11-23 ENCOUNTER — Ambulatory Visit: Payer: Self-pay | Admitting: Gastroenterology

## 2020-11-29 ENCOUNTER — Emergency Department (HOSPITAL_BASED_OUTPATIENT_CLINIC_OR_DEPARTMENT_OTHER)
Admission: EM | Admit: 2020-11-29 | Discharge: 2020-11-29 | Disposition: A | Discharge status: Home / Self Care | Payer: HRSA Program | Attending: Emergency Medicine | Admitting: Emergency Medicine

## 2020-11-29 ENCOUNTER — Other Ambulatory Visit: Payer: Self-pay

## 2020-11-29 ENCOUNTER — Encounter (HOSPITAL_BASED_OUTPATIENT_CLINIC_OR_DEPARTMENT_OTHER): Payer: Self-pay | Admitting: Emergency Medicine

## 2020-11-29 DIAGNOSIS — U071 COVID-19: Secondary | ICD-10-CM | POA: Diagnosis not present

## 2020-11-29 DIAGNOSIS — B349 Viral infection, unspecified: Secondary | ICD-10-CM | POA: Diagnosis not present

## 2020-11-29 DIAGNOSIS — R519 Headache, unspecified: Secondary | ICD-10-CM | POA: Diagnosis present

## 2020-11-29 DIAGNOSIS — Z20822 Contact with and (suspected) exposure to covid-19: Secondary | ICD-10-CM

## 2020-11-29 LAB — SARS CORONAVIRUS 2 (TAT 6-24 HRS): SARS Coronavirus 2: POSITIVE — AB

## 2020-11-29 MED ORDER — BENZONATATE 100 MG PO CAPS: 100.0000 mg | ORAL_CAPSULE | Freq: Three times a day (TID) | ORAL | 0 refills | Status: DC

## 2020-11-29 NOTE — ED Triage Notes (Signed)
Works at [employer] and today woke up feeling sob and has body aches states that there have been a lot of covid cases

## 2020-11-29 NOTE — Discharge Instructions (Signed)
You were tested for Covid.  Results should return in 6 to 24 hours.  You will receive a phone call if it is positive.  You will not hear anything if it is negative.  Either way, you may check online on MyChart. Regardless, this is likely a viral illness, which should be treated symptomatically.  Use Tylenol or ibuprofen as needed for fevers, headaches, or body aches.  Use drops as needed.  Make sure you stay well-hydrated with water.  Wash your hands frequently to prevent spread of infection.   If your Covid test is positive, you will need to quarantine for a total of 5 days from symptom onset.  You may end quarantine if you are fever free and your symptoms are improving, however it is extremely important that you wear a mask for an additional 5 days at all times. If you are not fever free your symptoms are not improving, you will need to quarantine until this is the case    Return to the emergency room if you develop chest pain, difficulty breathing, or any new or worsening symptoms.

## 2020-11-29 NOTE — ED Provider Notes (Signed)
[place] EMERGENCY DEPARTMENT Provider Note   CSN: [id number] Arrival date & time: 11/29/20  1149     History Chief Complaint  Patient presents with   Generalized Body Aches    Jennifer Cain is a 25 y.o. female presenting for evaluation of generalized body aches, cough, sore throat.  Patient states her symptoms began today.  She works in a [employer] where multiple people have tested positive for [employer], and states she has a close friend who recently tested positive.  She has not taken anything for her symptoms.  She has an associated mild headache.  She reports a nonproductive cough.  She has chest pain when coughing, no chest pain at other times.  She had shortness of breath this morning, but this is since resolved.  She denies nausea, vomiting, Donnell pain, urinary symptoms, normal bowel movements.  She has a history of Crohn's which she takes medication, no other medical problems.  She is not vaccinated against Covid.  Additional history obtained from chart review.  Patient with a history of Crohn's, on prednisone.  HPI     No past medical history on file.  Patient Active Problem List   Diagnosis Date Noted   Colitis 08/09/2018   Crohn's disease of small intestine ([place]) 07/24/2018   Failure to thrive in adult    Hypomagnesemia 07/20/2018   Normocytic anemia 07/19/2018   Generalized weakness 07/19/2018   Thrombocytosis 07/19/2018   Hypokalemia 07/19/2018   Malnutrition of moderate degree 07/19/2018   Enteritis-Chronic 07/18/2018   FTT (failure to thrive) in adult 07/18/2018   Protein calorie malnutrition ([place]) 07/18/2018   Chronic diarrhea 07/18/2018   Nausea & vomiting-chronic 07/18/2018    Past Surgical History:  Procedure Laterality Date   BIOPSY  07/20/2018   Procedure: BIOPSY;  Surgeon: Carol Ada, MD;  Location: [facility];  Service: Endoscopy;;   COLONOSCOPY WITH PROPOFOL N/A 07/20/2018   Procedure: COLONOSCOPY WITH PROPOFOL;   Surgeon: Carol Ada, MD;  Location: [facility];  Service: Endoscopy;  Laterality: N/A;   ESOPHAGOGASTRODUODENOSCOPY (EGD) WITH PROPOFOL N/A 07/20/2018   Procedure: ESOPHAGOGASTRODUODENOSCOPY (EGD) WITH PROPOFOL;  Surgeon: Carol Ada, MD;  Location: [facility];  Service: Endoscopy;  Laterality: N/A;     OB History   No obstetric history on file.     Family History  Problem Relation Age of Onset   GI Disease Neg Hx    Autoimmune disease Neg Hx     Social History   Tobacco Use   Smoking status: Never Smoker   Smokeless tobacco: Never Used  Vaping Use   Vaping Use: Never used  Substance Use Topics   Alcohol use: Never   Drug use: Never    Home Medications Prior to Admission medications   Medication Sig Start Date End Date Taking? Authorizing Provider  benzonatate (TESSALON) 100 MG capsule Take 1 capsule (100 mg total) by mouth every 8 (eight) hours. 11/29/20  Yes Raksha Wolfgang, PA-C  acetaminophen (TYLENOL) 325 MG tablet Take 2 tablets (650 mg total) by mouth every 6 (six) hours as needed for mild pain (or Fever >/= 101). 07/24/18   Eugenie Filler, MD  calcium-vitamin D (OSCAL 500/200 D-3) 500-200 MG-UNIT tablet Take 1 tablet by mouth 2 (two) times daily. 07/24/18 08/23/18  Eugenie Filler, MD  feeding supplement, ENSURE ENLIVE, (ENSURE ENLIVE) LIQD Take 237 mLs by mouth 2 (two) times daily between meals. 07/24/18   Eugenie Filler, MD  ferrous sulfate 325 (65 FE) MG tablet Take 1 tablet (  325 mg total) by mouth daily with breakfast. 08/09/18   Ladell Pier, MD  Multiple Vitamin (MULTIVITAMIN WITH MINERALS) TABS tablet Take 1 tablet by mouth daily. 07/25/18   Eugenie Filler, MD  ondansetron (ZOFRAN ODT) 4 MG disintegrating tablet 51m ODT q4 hours prn nausea/vomit 10/12/20   FJacqlyn Larsen PA-C  predniSONE (DELTASONE) 10 MG tablet Take 40 mg (4 tablets) daily for 2 weeks, then take 30 mg (3 tablets) daily for 1 week, then take 20 mg (2 tablets) daily for  1 week, then take 10 mg (1 tablet) daily for 1 week 10/12/20   FJacqlyn Larsen PA-C    Allergies    Patient has no known allergies.  Review of Systems   Review of Systems  HENT: Positive for congestion and sore throat.   Respiratory: Positive for cough.   Musculoskeletal: Positive for myalgias.  Neurological: Positive for headaches.  All other systems reviewed and are negative.   Physical Exam Updated Vital Signs BP (!) 146/100 (BP Location: Right Arm)    Pulse 88    Temp 98.7 F (37.1 C)    Resp 19    Ht 5' 3"  (1.6 m)    Wt 81.6 kg    SpO2 100%    BMI 31.89 kg/m   Physical Exam Vitals and nursing note reviewed.  Constitutional:      General: She is not in acute distress.    Appearance: She is well-developed and well-nourished.     Comments: Resting in the bed in NAD  HENT:     Head: Normocephalic and atraumatic.     Comments: Nasal mucosal edema and congestion noted.  OP clear without tonsillar swelling or exudate. Eyes:     Extraocular Movements: Extraocular movements intact and EOM normal.     Conjunctiva/sclera: Conjunctivae normal.     Pupils: Pupils are equal, round, and reactive to light.  Cardiovascular:     Rate and Rhythm: Normal rate and regular rhythm.     Pulses: Normal pulses and intact distal pulses.  Pulmonary:     Effort: Pulmonary effort is normal. No respiratory distress.     Breath sounds: Normal breath sounds. No wheezing.     Comments: Speaking in full sentences.  Clear lung sounds in all fields.  Sats stable on room air.  No accessory muscle use or signs of respiratory distress. Abdominal:     General: Bowel sounds are normal. There is no distension.     Palpations: Abdomen is soft.     Tenderness: There is no abdominal tenderness.  Musculoskeletal:        General: Normal range of motion.     Cervical back: Normal range of motion and neck supple.  Skin:    General: Skin is warm and dry.     Capillary Refill: Capillary refill takes less than 2  seconds.  Neurological:     Mental Status: She is alert and oriented to person, place, and time.  Psychiatric:        Mood and Affect: Mood and affect normal.     ED Results / Procedures / Treatments   Labs (all labs ordered are listed, but only abnormal results are displayed) Labs Reviewed  SARS CORONAVIRUS 2 (TAT 6-24 HRS)    EKG None  Radiology No results found.  Procedures Procedures (including critical care time)  Medications Ordered in ED Medications - No data to display  ED Course  I have reviewed the triage vital signs and the  nursing notes.  Pertinent labs & imaging results that were available during my care of the patient were reviewed by me and considered in my medical decision making (see chart for details).    MDM Rules/Calculators/A&P                          Patient presenting with 1 day h/o URI symptoms.  Physical exam reassuring, patient is afebrile and appears nontoxic.  Pulmonary exam reassuring.  Doubt pneumonia, strep, other bacterial infection, or peritonsillar abscess.  Covid test is pending.  Discussed with patient.  Discussed she should treat symptomatically.  Patient to follow-up with primary care as needed.  At this time, patient appears safe for discharge.  Return precautions given.  Patient states she understands and agrees to plan.  Jennifer Cain was evaluated in Emergency Department on 11/29/2020 for the symptoms described in the history of present illness. She was evaluated in the context of the global COVID-19 pandemic, which necessitated consideration that the patient might be at risk for infection with the SARS-CoV-2 virus that causes COVID-19. Institutional protocols and algorithms that pertain to the evaluation of patients at risk for COVID-19 are in a state of rapid change based on information released by regulatory bodies including the CDC and federal and state organizations. These policies and algorithms were followed during the patient's  care in the ED.  Final Clinical Impression(s) / ED Diagnoses Final diagnoses:  Suspected COVID-19 virus infection  Close exposure to COVID-19 virus  Viral illness    Rx / DC Orders ED Discharge Orders         Ordered    benzonatate (TESSALON) 100 MG capsule  Every 8 hours        11/29/20 [place], Venissa Nappi, PA-C 11/29/20 1602    Luna Fuse, MD 12/01/20 [phone]

## 2020-11-30 ENCOUNTER — Telehealth: Payer: Self-pay

## 2020-11-30 NOTE — Telephone Encounter (Signed)
I connected by phone with Jennifer Cain on 11/30/2020 at 10:12 AM to discuss the potential use of a new treatment for mild to moderate COVID-19 viral infection in non-hospitalized patients.  This patient is a 25 y.o. female that meets the FDA criteria for Emergency Use Authorization of COVID monoclonal antibody sotrovimab.  Has a (+) direct SARS-CoV-2 viral test result  Has mild or moderate COVID-19   Is NOT hospitalized due to COVID-19  Is within 10 days of symptom onset  Has at least one of the high risk factor(s) for progression to severe COVID-19 and/or hospitalization as defined in EUA.  Specific high risk criteria : Other high risk medical condition per CDC:  Crohn's on Prednisone.   I have spoken and communicated the following to the patient or parent/caregiver regarding COVID monoclonal antibody treatment:  1. FDA has authorized the emergency use for the treatment of mild to moderate COVID-19 in adults and pediatric patients with positive results of direct SARS-CoV-2 viral testing who are 17 years of age and older weighing at least 40 kg, and who are at high risk for progressing to severe COVID-19 and/or hospitalization.  2. The significant known and potential risks and benefits of COVID monoclonal antibody, and the extent to which such potential risks and benefits are unknown.  3. Information on available alternative treatments and the risks and benefits of those alternatives, including clinical trials.  4. Patients treated with COVID monoclonal antibody should continue to self-isolate and use infection control measures (e.g., wear mask, isolate, social distance, avoid sharing personal items, clean and disinfect "high touch" surfaces, and frequent handwashing) according to CDC guidelines.   5. The patient or parent/caregiver has the option to accept or refuse COVID monoclonal antibody treatment.  After reviewing this information with the patient, the patient has DECLINED offer to  receive the infusion. Marcello Moores, RN 11/30/2020 10:12 AM

## 2021-01-25 ENCOUNTER — Encounter (HOSPITAL_BASED_OUTPATIENT_CLINIC_OR_DEPARTMENT_OTHER): Payer: Self-pay | Admitting: Emergency Medicine

## 2021-01-25 ENCOUNTER — Emergency Department (HOSPITAL_BASED_OUTPATIENT_CLINIC_OR_DEPARTMENT_OTHER)
Admission: EM | Admit: 2021-01-25 | Discharge: 2021-01-25 | Disposition: A | Discharge status: Home / Self Care | Payer: Self-pay | Attending: Emergency Medicine | Admitting: Emergency Medicine

## 2021-01-25 ENCOUNTER — Other Ambulatory Visit: Payer: Self-pay

## 2021-01-25 DIAGNOSIS — H1032 Unspecified acute conjunctivitis, left eye: Secondary | ICD-10-CM | POA: Insufficient documentation

## 2021-01-25 HISTORY — DX: Crohn's disease, unspecified, without complications: K50.90

## 2021-01-25 MED ORDER — CIPROFLOXACIN HCL 0.3 % OP SOLN
1.0000 [drp] | OPHTHALMIC | Status: DC
  Administered 2021-01-25: 1 [drp] via OPHTHALMIC
  Filled 2021-01-25: qty 2.5

## 2021-01-25 MED ORDER — FLUORESCEIN SODIUM 1 MG OP STRP
1.0000 | ORAL_STRIP | Freq: Once | OPHTHALMIC | Status: AC
  Administered 2021-01-25: 1 via OPHTHALMIC
  Filled 2021-01-25: qty 1

## 2021-01-25 MED ORDER — TETRACAINE HCL 0.5 % OP SOLN
2.0000 [drp] | Freq: Once | OPHTHALMIC | Status: AC
  Administered 2021-01-25: 2 [drp] via OPHTHALMIC
  Filled 2021-01-25: qty 4

## 2021-01-25 NOTE — ED Triage Notes (Signed)
Pt states woke up one day last week and eye was swollen, denies injury or splash of irritant. Getting worse and is unable to sleep or work. No history of.

## 2021-01-25 NOTE — ED Provider Notes (Signed)
[facility] Provider Note: [name], MD, FACEP  CSN: [id number] MRN: [id number] ARRIVAL: 01/25/21 at [place]: [place] Problem   HISTORY OF PRESENT ILLNESS  01/25/21 6:49 AM Jennifer Cain is a 25 y.o. female with pain, erythema and exudate for several days.  She rates associated discomfort as an 8 out of 10, described as a soreness.  It is somewhat worse with exposure to light.  The exudate is causing the edges of her eyelids to stick together.  She is not aware of any injury to her eye.  Symptoms are severe enough that it is interfering with her ability to sleep and work.   Past Medical History:  Diagnosis Date  . Crohn disease [facility]     Past Surgical History:  Procedure Laterality Date  . BIOPSY  07/20/2018   Procedure: BIOPSY;  Surgeon: Carol Ada, MD;  Location: [facility];  Service: Endoscopy;;  . COLONOSCOPY WITH PROPOFOL N/A 07/20/2018   Procedure: COLONOSCOPY WITH PROPOFOL;  Surgeon: Carol Ada, MD;  Location: [facility];  Service: Endoscopy;  Laterality: N/A;  . ESOPHAGOGASTRODUODENOSCOPY (EGD) WITH PROPOFOL N/A 07/20/2018   Procedure: ESOPHAGOGASTRODUODENOSCOPY (EGD) WITH PROPOFOL;  Surgeon: Carol Ada, MD;  Location: [facility];  Service: Endoscopy;  Laterality: N/A;    Family History  Problem Relation Age of Onset  . GI Disease Neg Hx   . Autoimmune disease Neg Hx     Social History   Tobacco Use  . Smoking status: Never Smoker  . Smokeless tobacco: Never Used  Vaping Use  . Vaping Use: Never used  Substance Use Topics  . Alcohol use: Never  . Drug use: Never    Prior to Admission medications   Medication Sig Start Date End Date Taking? Authorizing Provider  acetaminophen (TYLENOL) 325 MG tablet Take 2 tablets (650 mg total) by mouth every 6 (six) hours as needed for mild pain (or Fever >/= 101). 07/24/18   Eugenie Filler, MD  benzonatate (TESSALON) 100 MG capsule Take 1 capsule (100 mg total) by  mouth every 8 (eight) hours. 11/29/20   Caccavale, Sophia, PA-C  calcium-vitamin D (OSCAL 500/200 D-3) 500-200 MG-UNIT tablet Take 1 tablet by mouth 2 (two) times daily. 07/24/18 08/23/18  Eugenie Filler, MD  feeding supplement, ENSURE ENLIVE, (ENSURE ENLIVE) LIQD Take 237 mLs by mouth 2 (two) times daily between meals. 07/24/18   Eugenie Filler, MD  ferrous sulfate 325 (65 FE) MG tablet Take 1 tablet (325 mg total) by mouth daily with breakfast. 08/09/18   Ladell Pier, MD  Multiple Vitamin (MULTIVITAMIN WITH MINERALS) TABS tablet Take 1 tablet by mouth daily. 07/25/18   Eugenie Filler, MD    Allergies Patient has no known allergies.   REVIEW OF SYSTEMS  Negative except as noted here or in the History of Present Illness.   PHYSICAL EXAMINATION  Initial Vital Signs Blood pressure (!) 138/91, pulse (!) 101, temperature 98 F (36.7 C), temperature source Oral, resp. rate 16, height 5' 4"  (1.626 m), weight 77.7 kg, SpO2 100 %.  Examination General: Well-developed, well-nourished female in no acute distress; appearance consistent with age of record HENT: normocephalic; atraumatic Eyes: pupils equal, round and reactive to light; extraocular muscles intact; mild photophobia; left conjunctival erythema with watery and mucoid exudate; no hypopyon; no fluorescein uptake; arcus senilis like ring around edge of left cornea not seen on the right cornea and without fluorescein uptake Neck: supple Heart: regular rate and rhythm  Lungs: clear to auscultation bilaterally Abdomen: soft; nondistended Extremities: No deformity; full range of motion Neurologic: Awake, alert and oriented; motor function intact in all extremities and symmetric; no facial droop Skin: Warm and dry Psychiatric: Normal mood and affect   RESULTS  Summary of this visit's results, reviewed and interpreted by myself:   EKG Interpretation  Date/Time:    Ventricular Rate:    PR Interval:    QRS Duration:   QT  Interval:    QTC Calculation:   R Axis:     Text Interpretation:        Laboratory Studies: No results found for this or any previous visit (from the past 24 hour(s)). Imaging Studies: No results found.  ED COURSE and MDM  Nursing notes, initial and subsequent vitals signs, including pulse oximetry, reviewed and interpreted by myself.  Vitals:   01/25/21 0637 01/25/21 0639  BP: (!) 138/91   Pulse: (!) 101   Resp: 16   Temp: 98 F (36.7 C)   TempSrc: Oral   SpO2: 100%   Weight:  77.7 kg  Height:  5' 4"  (1.626 m)   Medications  ciprofloxacin (CILOXAN) 0.3 % ophthalmic solution 1 drop (has no administration in time range)  fluorescein ophthalmic strip 1 strip (1 strip Left Eye Given by Other 01/25/21 0655)  tetracaine (PONTOCAINE) 0.5 % ophthalmic solution 2 drop (2 drops Left Eye Given by Other 01/25/21 1610)   Will start on ciprofloxacin eyedrops for suspected conjunctivitis and have patient follow-up with Dr. Valetta Close in his office this morning.   PROCEDURES  Procedures   ED DIAGNOSES     ICD-10-CM   1. Acute conjunctivitis of left eye, unspecified acute conjunctivitis type  H10.32        Achille Xiang, Jenny Reichmann, MD 01/25/21 [phone]

## 2021-01-25 NOTE — ED Notes (Signed)
Attempted to perform Visual acuity PT does not have her corrective glasses and is un able to make out 1st letter with right or both eyes

## 2021-02-05 ENCOUNTER — Other Ambulatory Visit: Payer: Self-pay

## 2021-02-05 ENCOUNTER — Emergency Department (HOSPITAL_BASED_OUTPATIENT_CLINIC_OR_DEPARTMENT_OTHER): Payer: Self-pay

## 2021-02-05 ENCOUNTER — Encounter (HOSPITAL_BASED_OUTPATIENT_CLINIC_OR_DEPARTMENT_OTHER): Payer: Self-pay | Admitting: *Deleted

## 2021-02-05 ENCOUNTER — Emergency Department (HOSPITAL_BASED_OUTPATIENT_CLINIC_OR_DEPARTMENT_OTHER)
Admission: EM | Admit: 2021-02-05 | Discharge: 2021-02-05 | Disposition: A | Discharge status: Home / Self Care | Payer: Self-pay | Attending: Emergency Medicine | Admitting: Emergency Medicine

## 2021-02-05 DIAGNOSIS — R3989 Other symptoms and signs involving the genitourinary system: Secondary | ICD-10-CM

## 2021-02-05 DIAGNOSIS — R112 Nausea with vomiting, unspecified: Secondary | ICD-10-CM | POA: Insufficient documentation

## 2021-02-05 DIAGNOSIS — R109 Unspecified abdominal pain: Secondary | ICD-10-CM | POA: Insufficient documentation

## 2021-02-05 DIAGNOSIS — R197 Diarrhea, unspecified: Secondary | ICD-10-CM | POA: Insufficient documentation

## 2021-02-05 DIAGNOSIS — Z8719 Personal history of other diseases of the digestive system: Secondary | ICD-10-CM | POA: Insufficient documentation

## 2021-02-05 DIAGNOSIS — N765 Ulceration of vagina: Secondary | ICD-10-CM | POA: Insufficient documentation

## 2021-02-05 LAB — WET PREP, GENITAL
Sperm: NONE SEEN
Trich, Wet Prep: NONE SEEN
Yeast Wet Prep HPF POC: NONE SEEN

## 2021-02-05 LAB — URINALYSIS, MICROSCOPIC (REFLEX)

## 2021-02-05 LAB — COMPREHENSIVE METABOLIC PANEL
ALT: 9 U/L (ref 0–44)
AST: 14 U/L — ABNORMAL LOW (ref 15–41)
Albumin: 2.9 g/dL — ABNORMAL LOW (ref 3.5–5.0)
Alkaline Phosphatase: 58 U/L (ref 38–126)
Anion gap: 11 (ref 5–15)
BUN: 7 mg/dL (ref 6–20)
CO2: 23 mmol/L (ref 22–32)
Calcium: 8.8 mg/dL — ABNORMAL LOW (ref 8.9–10.3)
Chloride: 102 mmol/L (ref 98–111)
Creatinine, Ser: 0.56 mg/dL (ref 0.44–1.00)
GFR, Estimated: >60 mL/min (ref >60)
Glucose, Bld: 72 mg/dL (ref 70–99)
Potassium: 3 mmol/L — ABNORMAL LOW (ref 3.5–5.1)
Sodium: 136 mmol/L (ref 135–145)
Total Bilirubin: 0.2 mg/dL — ABNORMAL LOW (ref 0.3–1.2)
Total Protein: 7.5 g/dL (ref 6.5–8.1)

## 2021-02-05 LAB — URINALYSIS, ROUTINE W REFLEX MICROSCOPIC
Glucose, UA: NEGATIVE mg/dL
Ketones, ur: 40 mg/dL — AB
Nitrite: NEGATIVE
Protein, ur: NEGATIVE mg/dL
Specific Gravity, Urine: 1.02 (ref 1.005–1.030)
pH: 6.5 (ref 5.0–8.0)

## 2021-02-05 LAB — CBC
HCT: 36.6 % (ref 36.0–46.0)
Hemoglobin: 12 g/dL (ref 12.0–15.0)
MCH: 28.4 pg (ref 26.0–34.0)
MCHC: 32.8 g/dL (ref 30.0–36.0)
MCV: 86.7 fL (ref 80.0–100.0)
Platelets: 343 10*3/uL (ref 150–400)
RBC: 4.22 MIL/uL (ref 3.87–5.11)
RDW: 13.7 % (ref 11.5–15.5)
WBC: 6.2 10*3/uL (ref 4.0–10.5)
nRBC: 0 % (ref 0.0–0.2)

## 2021-02-05 LAB — LIPASE, BLOOD: Lipase: 21 U/L (ref 11–51)

## 2021-02-05 LAB — PREGNANCY, URINE: Preg Test, Ur: NEGATIVE

## 2021-02-05 LAB — MAGNESIUM: Magnesium: 1.7 mg/dL (ref 1.7–2.4)

## 2021-02-05 MED ORDER — VALACYCLOVIR HCL 500 MG PO TABS
1000.0000 mg | ORAL_TABLET | Freq: Once | ORAL | Status: AC
  Administered 2021-02-05: 1000 mg via ORAL
  Filled 2021-02-05: qty 2

## 2021-02-05 MED ORDER — VALACYCLOVIR HCL 1 G PO TABS: 1000.0000 mg | ORAL_TABLET | Freq: Two times a day (BID) | ORAL | 0 refills | Status: AC

## 2021-02-05 MED ORDER — LACTATED RINGERS IV BOLUS: 1000.0000 mL | Freq: Once | INTRAVENOUS | Status: DC

## 2021-02-05 MED ORDER — POTASSIUM CHLORIDE CRYS ER 20 MEQ PO TBCR
40.0000 meq | EXTENDED_RELEASE_TABLET | Freq: Once | ORAL | Status: AC
  Administered 2021-02-05: 40 meq via ORAL
  Filled 2021-02-05: qty 2

## 2021-02-05 MED ORDER — ONDANSETRON 4 MG PO TBDP: 4.0000 mg | ORAL_TABLET | Freq: Three times a day (TID) | ORAL | 1 refills | Status: DC | PRN

## 2021-02-05 MED ORDER — LIDOCAINE 5 % EX OINT: 1.0000 "application " | TOPICAL_OINTMENT | CUTANEOUS | 0 refills | Status: DC | PRN

## 2021-02-05 MED ORDER — PROMETHAZINE HCL 25 MG/ML IJ SOLN
25.0000 mg | Freq: Once | INTRAMUSCULAR | Status: AC
  Administered 2021-02-05: 25 mg via INTRAMUSCULAR
  Filled 2021-02-05: qty 1

## 2021-02-05 MED ORDER — SODIUM CHLORIDE 0.9 % IV BOLUS
1000.0000 mL | Freq: Once | INTRAVENOUS | Status: AC
  Administered 2021-02-05: 1000 mL via INTRAVENOUS

## 2021-02-05 MED ORDER — HYDROMORPHONE HCL 1 MG/ML IJ SOLN
0.5000 mg | Freq: Once | INTRAMUSCULAR | Status: AC
Start: 2021-02-05 — End: 2021-02-05
  Administered 2021-02-05: 0.5 mg via SUBCUTANEOUS
  Filled 2021-02-05: qty 1

## 2021-02-05 MED ORDER — IOHEXOL 300 MG/ML  SOLN: 100.0000 mL | Freq: Once | INTRAMUSCULAR | Status: DC | PRN

## 2021-02-05 MED ORDER — ONDANSETRON 4 MG PO TBDP
8.0000 mg | ORAL_TABLET | Freq: Once | ORAL | Status: AC
  Administered 2021-02-05: 8 mg via ORAL
  Filled 2021-02-05: qty 2

## 2021-02-05 MED ORDER — ONDANSETRON HCL 4 MG/2ML IJ SOLN
4.0000 mg | Freq: Once | INTRAMUSCULAR | Status: DC
  Filled 2021-02-05: qty 2

## 2021-02-05 MED ORDER — MORPHINE SULFATE (PF) 2 MG/ML IV SOLN
2.0000 mg | Freq: Once | INTRAVENOUS | Status: DC
  Filled 2021-02-05: qty 1

## 2021-02-05 NOTE — ED Notes (Signed)
Per EDP [place], sh will see if EDP Maryan Rued will attempt IV [place]

## 2021-02-05 NOTE — Discharge Instructions (Addendum)
Take the Zofran every 8 hours as needed for nausea. Drink clear liquids until your pain is improving.   Please take double your daily dose of prednisone for the next few days, and then return to your regular dose. Take the valacyclovir every 12 hours for at least 1 week, until you are not developing new lesions. You can apply the lidocaine cream to your rash to help with additional pain relief.  This is for external use only. It is very important that you call your GI doctor for close follow-up and regular management of your Crohn's disease. Return to the emergency department if you are unable to control your vomiting with Zofran, if you have worsening abdominal pain, fever, or other concerning symptoms.

## 2021-02-05 NOTE — ED Notes (Signed)
2 unsuccessful IV attempts, pt tolerated well

## 2021-02-05 NOTE — ED Notes (Signed)
Rate on fluids slowed to 260m/hr, swelling noted. Fluids stopped.

## 2021-02-05 NOTE — ED Notes (Signed)
Pt fluids running at 467m/hr to try to get fluids into pt at slow rate per EDP Plunkett.

## 2021-02-05 NOTE — ED Notes (Signed)
Provided PO challenge

## 2021-02-05 NOTE — ED Provider Notes (Signed)
[facility] EMERGENCY DEPARTMENT Provider Note   CSN: [id number] Arrival date & time: 02/05/21  1247     History Chief Complaint  Patient presents with  . Abdominal Pain  . Emesis    Jennifer Cain is a 25 y.o. female with PMHx crohn disease of the small intestine, presenting to the emergency department with complaint of nausea, vomiting, and diarrhea, as well as genital sores.  Patient states for last 2 weeks she has been having nausea and vomiting as well as watery diarrhea whenever she eats.  She is also having intermittent crampy abdominal pains that are worse in the left side of the abdomen.  Symptoms feel similar to Crohn's flare.  She was seen by Dr. Bryan Lemma with [facility] in 2019, however seems to have been lost to follow-up.  She states she does take prednisone daily for her Crohn's, she is unsure of the dose.  She has not taken any other medications for her symptoms. No reported hematemesis or bloody stools.   She states around the same time she began developing sores to her genitalia and around her rectum.  This is making it very painful to her to wipe when she has bowel movements, and causes a lot of pain when she urinates.  She does not endorse any abnormal vaginal discharge.  Denies history of genital herpes.  States she was last sexually active with a female partner about 2 months ago, she did use protection.  Denies urinary frequency or urgency.  The history is provided by the patient and medical records.       Past Medical History:  Diagnosis Date  . Crohn disease [facility])     Patient Active Problem List   Diagnosis Date Noted  . Colitis 08/09/2018  . Crohn's disease of small intestine ([place]) 07/24/2018  . Failure to thrive in adult   . Hypomagnesemia 07/20/2018  . Normocytic anemia 07/19/2018  . Generalized weakness 07/19/2018  . Thrombocytosis 07/19/2018  . Hypokalemia 07/19/2018  . Malnutrition of moderate degree 07/19/2018  . Enteritis-Chronic  07/18/2018  . FTT (failure to thrive) in adult 07/18/2018  . Protein calorie malnutrition ([place]) 07/18/2018  . Chronic diarrhea 07/18/2018  . Nausea & vomiting-chronic 07/18/2018    Past Surgical History:  Procedure Laterality Date  . BIOPSY  07/20/2018   Procedure: BIOPSY;  Surgeon: Carol Ada, MD;  Location: [facility];  Service: Endoscopy;;  . COLONOSCOPY WITH PROPOFOL N/A 07/20/2018   Procedure: COLONOSCOPY WITH PROPOFOL;  Surgeon: Carol Ada, MD;  Location: [facility];  Service: Endoscopy;  Laterality: N/A;  . ESOPHAGOGASTRODUODENOSCOPY (EGD) WITH PROPOFOL N/A 07/20/2018   Procedure: ESOPHAGOGASTRODUODENOSCOPY (EGD) WITH PROPOFOL;  Surgeon: Carol Ada, MD;  Location: [facility];  Service: Endoscopy;  Laterality: N/A;     OB History   No obstetric history on file.     Family History  Problem Relation Age of Onset  . GI Disease Neg Hx   . Autoimmune disease Neg Hx     Social History   Tobacco Use  . Smoking status: Never Smoker  . Smokeless tobacco: Never Used  Vaping Use  . Vaping Use: Never used  Substance Use Topics  . Alcohol use: Never  . Drug use: Never    Home Medications Prior to Admission medications   Medication Sig Start Date End Date Taking? Authorizing Provider  lidocaine (XYLOCAINE) 5 % ointment Apply 1 application topically as needed. 02/05/21  Yes Quentin Cornwall, [place] N, PA-C  ondansetron (ZOFRAN ODT) 4 MG disintegrating tablet Take  1 tablet (4 mg total) by mouth every 8 (eight) hours as needed for nausea or vomiting. 02/05/21  Yes Robinson, [place] N, PA-C  valACYclovir (VALTREX) 1000 MG tablet Take 1 tablet (1,000 mg total) by mouth 2 (two) times daily for 20 days. 02/05/21 02/25/21 Yes Robinson, [place] N, PA-C  acetaminophen (TYLENOL) 325 MG tablet Take 2 tablets (650 mg total) by mouth every 6 (six) hours as needed for mild pain (or Fever >/= 101). 07/24/18   Eugenie Filler, MD  benzonatate (TESSALON) 100 MG capsule Take 1 capsule (100 mg  total) by mouth every 8 (eight) hours. 11/29/20   Caccavale, Sophia, PA-C  calcium-vitamin D (OSCAL 500/200 D-3) 500-200 MG-UNIT tablet Take 1 tablet by mouth 2 (two) times daily. 07/24/18 08/23/18  Eugenie Filler, MD  feeding supplement, ENSURE ENLIVE, (ENSURE ENLIVE) LIQD Take 237 mLs by mouth 2 (two) times daily between meals. 07/24/18   Eugenie Filler, MD  ferrous sulfate 325 (65 FE) MG tablet Take 1 tablet (325 mg total) by mouth daily with breakfast. 08/09/18   Ladell Pier, MD  Multiple Vitamin (MULTIVITAMIN WITH MINERALS) TABS tablet Take 1 tablet by mouth daily. 07/25/18   Eugenie Filler, MD    Allergies    Patient has no known allergies.  Review of Systems   Review of Systems  Constitutional: Negative for chills and fever.  Gastrointestinal: Positive for abdominal pain, diarrhea, nausea, rectal pain and vomiting.  Genitourinary: Positive for dysuria, genital sores and vaginal pain. Negative for frequency and urgency.  All other systems reviewed and are negative.   Physical Exam Updated Vital Signs BP 119/86   Pulse 88   Temp (S) 100 F (37.8 C) (Oral) Comment: [place] PA notified, no new orders  Resp 16   Ht 5' 3"  (1.6 m)   Wt 72.6 kg   LMP 01/17/2021 (Approximate)   SpO2 96%   BMI 28.34 kg/m   Physical Exam Vitals and nursing note reviewed.  Constitutional:      General: She is not in acute distress.    Appearance: She is well-developed.  HENT:     Head: Normocephalic and atraumatic.  Eyes:     Conjunctiva/sclera: Conjunctivae normal.  Cardiovascular:     Rate and Rhythm: Normal rate and regular rhythm.  Pulmonary:     Effort: Pulmonary effort is normal. No respiratory distress.     Breath sounds: Normal breath sounds.  Abdominal:     General: Abdomen is flat. Bowel sounds are normal.     Palpations: Abdomen is soft.     Tenderness: There is abdominal tenderness in the epigastric area, left upper quadrant and left lower quadrant. There is no  guarding or rebound.  Genitourinary:    Comments: Exam performed with female RN chaperone present.  The vulva has multiple scattered tender erythematous ulcerations.  The left labia majora is swollen with diffusely scattered ulcers. Ulcers involve the labia minor and majora and the perineal region. No visualized ulcers to the anus. Patient would not tolerate any speculum exam therefore vaginal swabs were collected. Skin:    General: Skin is warm.  Neurological:     Mental Status: She is alert.  Psychiatric:        Behavior: Behavior normal.     ED Results / Procedures / Treatments   Labs (all labs ordered are listed, but only abnormal results are displayed) Labs Reviewed  WET PREP, GENITAL - Abnormal; Notable for the following components:  Result Value   Clue Cells Wet Prep HPF POC PRESENT (*)    WBC, Wet Prep HPF POC FEW (*)    All other components within normal limits  COMPREHENSIVE METABOLIC PANEL - Abnormal; Notable for the following components:   Potassium 3.0 (*)    Calcium 8.8 (*)    Albumin 2.9 (*)    AST 14 (*)    Total Bilirubin 0.2 (*)    All other components within normal limits  URINALYSIS, ROUTINE W REFLEX MICROSCOPIC - Abnormal; Notable for the following components:   Hgb urine dipstick TRACE (*)    Bilirubin Urine SMALL (*)    Ketones, ur 40 (*)    Leukocytes,Ua MODERATE (*)    All other components within normal limits  URINALYSIS, MICROSCOPIC (REFLEX) - Abnormal; Notable for the following components:   Bacteria, UA FEW (*)    All other components within normal limits  LIPASE, BLOOD  CBC  PREGNANCY, URINE  MAGNESIUM  HIV ANTIBODY (ROUTINE TESTING W REFLEX)  RPR  GC/CHLAMYDIA PROBE AMP ([facility]) NOT AT [facility]    EKG None  Radiology CT Abdomen Pelvis Wo Contrast  Result Date: 02/05/2021 CLINICAL DATA:  History of Crohn's disease. Nausea, vomiting, diarrhea. Left upper quadrant pain EXAM: CT ABDOMEN AND PELVIS WITHOUT CONTRAST TECHNIQUE:  Multidetector CT imaging of the abdomen and pelvis was performed following the standard protocol without IV contrast. COMPARISON:  10/12/2020 FINDINGS: Lower chest: Lung bases are clear. No effusions. Heart is normal size. Hepatobiliary: No focal hepatic abnormality. Gallbladder unremarkable. Pancreas: No focal abnormality or ductal dilatation. Spleen: No focal abnormality.  Normal size. Adrenals/Urinary Tract: No adrenal abnormality. No focal renal abnormality. No stones or hydronephrosis. Urinary bladder is unremarkable. Stomach/Bowel: Bowel grossly unremarkable. No visible wall thickening or inflammatory process. No obstruction. Vascular/Lymphatic: No evidence of aneurysm or adenopathy. Reproductive: Uterus and adnexa unremarkable.  No mass. Other: No free fluid or free air. Musculoskeletal: No acute bony abnormality. IMPRESSION: No acute findings in the abdomen or pelvis. Electronically Signed   By: Rolm Baptise M.D.   On: 02/05/2021 19:39    Procedures Procedures   Medications Ordered in ED Medications  iohexol (OMNIPAQUE) 300 MG/ML solution 100 mL (has no administration in time range)  sodium chloride 0.9 % bolus 1,000 mL (0 mLs Intravenous Stopped 02/05/21 1914)  promethazine (PHENERGAN) injection 25 mg (25 mg Intramuscular Given 02/05/21 1635)  HYDROmorphone (DILAUDID) injection 0.5 mg (0.5 mg Subcutaneous Given 02/05/21 1907)  valACYclovir (VALTREX) tablet 1,000 mg (1,000 mg Oral Given 02/05/21 2046)  potassium chloride SA (KLOR-CON) CR tablet 40 mEq (40 mEq Oral Given 02/05/21 2046)  ondansetron (ZOFRAN-ODT) disintegrating tablet 8 mg (8 mg Oral Given 02/05/21 2046)    ED Course  I have reviewed the triage vital signs and the nursing notes.  Pertinent labs & imaging results that were available during my care of the patient were reviewed by me and considered in my medical decision making (see chart for details).    MDM Rules/Calculators/A&P                          Patient with history  of Crohn's disease, last outpatient follow-up with GI, on chronic prednisone (she thinks 5 mg daily).  She is here with 2 weeks of nausea, vomiting, diarrhea.  Her symptoms occur after she eats or drinks.  She is having about 2 episodes of nonbloody nonbilious emesis daily as well as 2 episodes of nonbloody diarrhea.  She  is not having any fevers or urinary symptoms.  Her symptoms do feel like a Crohn's flare.  She initially was treating her symptoms as Zofran which provided adequate relief, however she ran out of the medication.  She is also having sores to her genitalia which is new occurrence.  She reports being sexually active with 1 female partner and used protection in the past.  She is afebrile stable vital signs on evaluation.  Her abdomen is soft with some tenderness noted favoring the left abdomen, no guarding or rebound.  The vulva has diffusely scattered erythematous ulcerations which are very tender.  Vaginal swabs are obtained, patient would not tolerate any speculum exam as the ulcers are also surrounding the labia minora and introitus.  Sores are very concerning for genital herpes.  This would be her primary outbreak. No hx of behcets disease or other accompanying findings today to suggest.  Patient was very hard to obtain IV access in after multiple nurses attempt, attending physician Dr. Maryan Rued was able to successfully place an ultrasound-guided IV, however after about 200 cc of normal saline this IV then infiltrated.  Patient was given IM medications for her symptom management including Phenergan and IM Dilaudid.  This provided quite a bit of relief of her symptoms and she is tolerating p.o. fluids and p.o. challenge.  Due to IV access issues, patient was only able to obtain noncontrasted CT scan of the abdomen though this is overall negative.   She is feeling improved after medications, tolerating clear fluids.  Patient work-up and care plan discussed with attending physician Dr.  Maryan Rued.  Will treat with valacyclovir to cover primary outbreak of genital herpes.  Also recommend she double her dose of prednisone for the next few days to treat Crohn's exacerbation.  She is given prescription for Zofran as this is provided relief for her at home in the past.  Recommend clear liquids until symptoms improve and slowly advance diet as tolerated.  Also stressed the importance of following up with GI and regular management of her Crohn's disease by GI.  She verbalized understanding of this.  Patient discharged with strict return precautions including severely worsening pain, uncontrollable vomiting, fever, or any other concerning symptoms.  She is discharged in no acute distress.   Final Clinical Impression(s) / ED Diagnoses Final diagnoses:  Genital sore  Nausea vomiting and diarrhea  History of Crohn's disease    Rx / DC Orders ED Discharge Orders         Ordered    valACYclovir (VALTREX) 1000 MG tablet  2 times daily        02/05/21 2043    ondansetron (ZOFRAN ODT) 4 MG disintegrating tablet  Every 8 hours PRN        02/05/21 2043    lidocaine (XYLOCAINE) 5 % ointment  As needed        02/05/21 2043           Robinson, [place] N, PA-C 02/05/21 2137    Blanchie Dessert, MD 02/05/21 [phone]

## 2021-02-05 NOTE — ED Notes (Signed)
ED Provider Plunkett at bedside to attempt [place] IV.

## 2021-02-05 NOTE — ED Notes (Signed)
Attempted IV to left hand, blood obtained for labs, tol well. Unsuccessful

## 2021-02-05 NOTE — ED Triage Notes (Signed)
Pt reports hx of crohn's disease. States she has had n/v/d x 1-2 weeks. States no episodes today but she endorses nausea. Pain is in LUQ. Also c/o "sores" around her vagina and rectum

## 2021-02-05 NOTE — ED Notes (Signed)
Sophie, RN will attempt IV

## 2021-02-06 LAB — HIV ANTIBODY (ROUTINE TESTING W REFLEX): HIV Screen 4th Generation wRfx: NONREACTIVE

## 2021-02-06 LAB — RPR: RPR Ser Ql: NONREACTIVE

## 2021-02-07 ENCOUNTER — Telehealth: Payer: Self-pay | Admitting: General Surgery

## 2021-02-07 LAB — GC/CHLAMYDIA PROBE AMP (~~LOC~~) NOT AT ARMC
Chlamydia: NEGATIVE
Comment: NEGATIVE
Comment: NORMAL
Neisseria Gonorrhea: NEGATIVE

## 2021-02-07 NOTE — Telephone Encounter (Signed)
Left a message for the patient to contact the office regarding her recent ED visit for Crohns, Dr Bryan Lemma would like her to schedule a follow up from that visit to re-establish care.

## 2021-02-15 ENCOUNTER — Encounter: Payer: Self-pay | Admitting: General Surgery

## 2021-02-15 NOTE — Progress Notes (Deleted)
Dear Ms Ladell Pier,  I am contacting you to schedule an appointment for follow up with Dr Bryan Lemma to re-establish care for Crohn's disease. I left a message on your voicemail on 02/07/2021 to contact he office.  Please call me to schedule an appointment at your earliest convenience [phone].   Respectfully,  Reather Laurence, CMA (AAMA)

## 2021-02-23 ENCOUNTER — Other Ambulatory Visit: Payer: Self-pay

## 2021-02-23 ENCOUNTER — Ambulatory Visit: Payer: Self-pay | Attending: Physician Assistant | Admitting: Physician Assistant

## 2021-03-01 ENCOUNTER — Encounter: Payer: Self-pay | Admitting: General Surgery

## 2021-03-01 NOTE — Telephone Encounter (Signed)
Sent letter to Patient to schedule an appointment after ED visit to re-establish care.

## 2021-04-26 ENCOUNTER — Encounter (HOSPITAL_BASED_OUTPATIENT_CLINIC_OR_DEPARTMENT_OTHER): Payer: Self-pay

## 2021-04-26 ENCOUNTER — Other Ambulatory Visit: Payer: Self-pay

## 2021-04-26 ENCOUNTER — Emergency Department (HOSPITAL_BASED_OUTPATIENT_CLINIC_OR_DEPARTMENT_OTHER)
Admission: EM | Admit: 2021-04-26 | Discharge: 2021-04-26 | Disposition: A | Discharge status: Home / Self Care | Payer: Self-pay | Attending: Emergency Medicine | Admitting: Emergency Medicine

## 2021-04-26 DIAGNOSIS — R197 Diarrhea, unspecified: Secondary | ICD-10-CM | POA: Insufficient documentation

## 2021-04-26 DIAGNOSIS — R1084 Generalized abdominal pain: Secondary | ICD-10-CM | POA: Insufficient documentation

## 2021-04-26 DIAGNOSIS — R112 Nausea with vomiting, unspecified: Secondary | ICD-10-CM | POA: Insufficient documentation

## 2021-04-26 LAB — URINALYSIS, ROUTINE W REFLEX MICROSCOPIC
Glucose, UA: NEGATIVE mg/dL
Hgb urine dipstick: NEGATIVE
Ketones, ur: >80 mg/dL — AB
Leukocytes,Ua: NEGATIVE
Nitrite: NEGATIVE
Protein, ur: NEGATIVE mg/dL
Specific Gravity, Urine: 1.025 (ref 1.005–1.030)
pH: 6.5 (ref 5.0–8.0)

## 2021-04-26 LAB — COMPREHENSIVE METABOLIC PANEL
ALT: 9 U/L (ref 0–44)
AST: 12 U/L — ABNORMAL LOW (ref 15–41)
Albumin: 2.6 g/dL — ABNORMAL LOW (ref 3.5–5.0)
Alkaline Phosphatase: 74 U/L (ref 38–126)
Anion gap: 8 (ref 5–15)
BUN: 7 mg/dL (ref 6–20)
CO2: 25 mmol/L (ref 22–32)
Calcium: 8.6 mg/dL — ABNORMAL LOW (ref 8.9–10.3)
Chloride: 101 mmol/L (ref 98–111)
Creatinine, Ser: 0.47 mg/dL (ref 0.44–1.00)
GFR, Estimated: >60 mL/min (ref >60)
Glucose, Bld: 72 mg/dL (ref 70–99)
Potassium: 4.6 mmol/L (ref 3.5–5.1)
Sodium: 134 mmol/L — ABNORMAL LOW (ref 135–145)
Total Bilirubin: 0.5 mg/dL (ref 0.3–1.2)
Total Protein: 8 g/dL (ref 6.5–8.1)

## 2021-04-26 LAB — PREGNANCY, URINE: Preg Test, Ur: NEGATIVE

## 2021-04-26 LAB — CBC
HCT: 36.6 % (ref 36.0–46.0)
Hemoglobin: 12 g/dL (ref 12.0–15.0)
MCH: 28.2 pg (ref 26.0–34.0)
MCHC: 32.8 g/dL (ref 30.0–36.0)
MCV: 85.9 fL (ref 80.0–100.0)
Platelets: 365 10*3/uL (ref 150–400)
RBC: 4.26 MIL/uL (ref 3.87–5.11)
RDW: 14.1 % (ref 11.5–15.5)
WBC: 3.9 10*3/uL — ABNORMAL LOW (ref 4.0–10.5)
nRBC: 0 % (ref 0.0–0.2)

## 2021-04-26 LAB — LIPASE, BLOOD: Lipase: 20 U/L (ref 11–51)

## 2021-04-26 MED ORDER — DICYCLOMINE HCL 20 MG PO TABS: 20.0000 mg | ORAL_TABLET | Freq: Two times a day (BID) | ORAL | 0 refills | Status: DC

## 2021-04-26 MED ORDER — PREDNISONE 50 MG PO TABS
60.0000 mg | ORAL_TABLET | Freq: Once | ORAL | Status: AC
  Administered 2021-04-26: 60 mg via ORAL
  Filled 2021-04-26: qty 1

## 2021-04-26 MED ORDER — LACTATED RINGERS IV BOLUS
1000.0000 mL | Freq: Once | INTRAVENOUS | Status: AC
  Administered 2021-04-26: 1000 mL via INTRAVENOUS

## 2021-04-26 MED ORDER — DICYCLOMINE HCL 10 MG PO CAPS
20.0000 mg | ORAL_CAPSULE | Freq: Once | ORAL | Status: AC
  Administered 2021-04-26: 20 mg via ORAL
  Filled 2021-04-26: qty 2

## 2021-04-26 MED ORDER — PREDNISONE 10 MG PO TABS: 40.0000 mg | ORAL_TABLET | Freq: Every day | ORAL | 0 refills | Status: DC

## 2021-04-26 MED ORDER — ONDANSETRON 4 MG PO TBDP: 4.0000 mg | ORAL_TABLET | Freq: Three times a day (TID) | ORAL | 0 refills | Status: DC | PRN

## 2021-04-26 MED ORDER — MORPHINE SULFATE (PF) 4 MG/ML IV SOLN
4.0000 mg | Freq: Once | INTRAVENOUS | Status: AC
Start: 2021-04-26 — End: 2021-04-26
  Administered 2021-04-26: 4 mg via INTRAVENOUS
  Filled 2021-04-26: qty 1

## 2021-04-26 MED ORDER — ONDANSETRON HCL 4 MG/2ML IJ SOLN
4.0000 mg | Freq: Once | INTRAMUSCULAR | Status: AC
  Administered 2021-04-26: 4 mg via INTRAVENOUS
  Filled 2021-04-26: qty 2

## 2021-04-26 NOTE — Discharge Instructions (Signed)
Please follow-up with your gastroenterologist.  I have attached his information for your follow-up.  Please take the Bentyl Zofran and prednisone as prescribed.  Please use prednisone daily for the next 4 days.  Please drink plenty of water.  Needed Zofran for nausea as needed and Bentyl for pain as needed.  You may return to the ER for any new or concerning symptoms.

## 2021-04-26 NOTE — ED Triage Notes (Signed)
Pt c/o abd pain, n/v x 2 weeks-NAD-steady gait

## 2021-04-26 NOTE — ED Provider Notes (Signed)
[facility] EMERGENCY DEPARTMENT Provider Note   CSN: [id number] Arrival date & time: 04/26/21  1329     History Chief Complaint  Patient presents with  . Abdominal Pain    Jennifer Cain is a 25 y.o. female.  HPI Patient is a 25 year old female with a past medical history significant for Crohn's disease present Emergency Department for evaluation of abdominal pain, nausea, vomiting, diarrhea.  She states that her stools been nonbloody with no melena hematochezia, denies any bilious or bloody emesis.  She states her symptoms been consistent for 2 weeks.  She states that her pain is better after she vomits and worse when she eats.  She states she has been able to keep down solids and fluids but is unable to keep down any food. Denies any urinary frequency urgency or dysuria, no fevers or chills.  Patient was diagnosed with Crohn's disease in 2019 after she was hospitalized and had a biopsy done by Dr. Benson Norway.   Patient was seen 10/12/2020 for similar symptoms.     Past Medical History:  Diagnosis Date  . Crohn disease [facility])     Patient Active Problem List   Diagnosis Date Noted  . Colitis 08/09/2018  . Crohn's disease of small intestine ([place]) 07/24/2018  . Failure to thrive in adult   . Hypomagnesemia 07/20/2018  . Normocytic anemia 07/19/2018  . Generalized weakness 07/19/2018  . Thrombocytosis 07/19/2018  . Hypokalemia 07/19/2018  . Malnutrition of moderate degree 07/19/2018  . Enteritis-Chronic 07/18/2018  . FTT (failure to thrive) in adult 07/18/2018  . Protein calorie malnutrition ([place]) 07/18/2018  . Chronic diarrhea 07/18/2018  . Nausea & vomiting-chronic 07/18/2018    Past Surgical History:  Procedure Laterality Date  . BIOPSY  07/20/2018   Procedure: BIOPSY;  Surgeon: Carol Ada, MD;  Location: [facility];  Service: Endoscopy;;  . COLONOSCOPY WITH PROPOFOL N/A 07/20/2018   Procedure: COLONOSCOPY WITH PROPOFOL;  Surgeon: Carol Ada, MD;   Location: [facility] ENDOSCOPY;  Service: Endoscopy;  Laterality: N/A;  . ESOPHAGOGASTRODUODENOSCOPY (EGD) WITH PROPOFOL N/A 07/20/2018   Procedure: ESOPHAGOGASTRODUODENOSCOPY (EGD) WITH PROPOFOL;  Surgeon: Carol Ada, MD;  Location: [facility] ENDOSCOPY;  Service: Endoscopy;  Laterality: N/A;     OB History   No obstetric history on file.     Family History  Problem Relation Age of Onset  . GI Disease Neg Hx   . Autoimmune disease Neg Hx     Social History   Tobacco Use  . Smoking status: Never Smoker  . Smokeless tobacco: Never Used  Vaping Use  . Vaping Use: Never used  Substance Use Topics  . Alcohol use: Never  . Drug use: Never    Home Medications Prior to Admission medications   Medication Sig Start Date End Date Taking? Authorizing Provider  dicyclomine (BENTYL) 20 MG tablet Take 1 tablet (20 mg total) by mouth 2 (two) times daily. 04/26/21  Yes Aiza Vollrath S, PA  ondansetron (ZOFRAN ODT) 4 MG disintegrating tablet Take 1 tablet (4 mg total) by mouth every 8 (eight) hours as needed for nausea or vomiting. 04/26/21  Yes Rakeen Gaillard, Kathleene Hazel, PA  predniSONE (DELTASONE) 10 MG tablet Take 4 tablets (40 mg total) by mouth daily with breakfast. 04/26/21  Yes Wilfred Dayrit S, PA  acetaminophen (TYLENOL) 325 MG tablet Take 2 tablets (650 mg total) by mouth every 6 (six) hours as needed for mild pain (or Fever >/= 101). 07/24/18   Eugenie Filler, MD  benzonatate (TESSALON) 100 MG capsule  Take 1 capsule (100 mg total) by mouth every 8 (eight) hours. 11/29/20   Caccavale, Sophia, PA-C  calcium-vitamin D (OSCAL 500/200 D-3) 500-200 MG-UNIT tablet Take 1 tablet by mouth 2 (two) times daily. 07/24/18 08/23/18  Eugenie Filler, MD  feeding supplement, ENSURE ENLIVE, (ENSURE ENLIVE) LIQD Take 237 mLs by mouth 2 (two) times daily between meals. 07/24/18   Eugenie Filler, MD  ferrous sulfate 325 (65 FE) MG tablet Take 1 tablet (325 mg total) by mouth daily with breakfast. 08/09/18   Ladell Pier, MD   lidocaine (XYLOCAINE) 5 % ointment Apply 1 application topically as needed. 02/05/21   Robinson, [place] N, PA-C  Multiple Vitamin (MULTIVITAMIN WITH MINERALS) TABS tablet Take 1 tablet by mouth daily. 07/25/18   Eugenie Filler, MD    Allergies    Patient has no known allergies.  Review of Systems   Review of Systems  Constitutional: Positive for fatigue. Negative for chills and fever.  HENT: Negative for congestion.   Eyes: Negative for pain.  Respiratory: Negative for cough and shortness of breath.   Cardiovascular: Negative for chest pain and leg swelling.  Gastrointestinal: Positive for abdominal pain, diarrhea, nausea and vomiting.  Genitourinary: Negative for dysuria.  Musculoskeletal: Negative for myalgias.  Skin: Negative for rash.  Neurological: Negative for dizziness and headaches.    Physical Exam Updated Vital Signs BP (!) 134/96   Pulse 73   Temp 98.4 F (36.9 C) (Oral)   Resp 16   Ht 5' 4"  (1.626 m)   Wt 64.4 kg   LMP 04/19/2021   SpO2 99%   BMI 24.37 kg/m   Physical Exam Vitals and nursing note reviewed.  Constitutional:      General: She is not in acute distress.    Comments: 25 year old female appears uncomfortable.  Speaking in full sentences.  Answers questions appropriately + follows commands  HENT:     Head: Normocephalic and atraumatic.     Nose: Nose normal.     Mouth/Throat:     Mouth: Mucous membranes are dry.  Eyes:     General: No scleral icterus. Cardiovascular:     Rate and Rhythm: Normal rate and regular rhythm.     Pulses: Normal pulses.     Heart sounds: Normal heart sounds.  Pulmonary:     Effort: Pulmonary effort is normal. No respiratory distress.     Breath sounds: No wheezing.  Abdominal:     Palpations: Abdomen is soft.     Tenderness: There is generalized abdominal tenderness.     Comments: Abdomen is flat, not on distended.  Bowel sounds present.  No palpable masses. There is some diffuse abdominal tenderness.  No  guarding or rebound.  No focal tenderness to palpation.  Musculoskeletal:     Cervical back: Normal range of motion.     Right lower leg: No edema.     Left lower leg: No edema.  Skin:    General: Skin is warm and dry.     Capillary Refill: Capillary refill takes less than 2 seconds.  Neurological:     Mental Status: She is alert. Mental status is at baseline.  Psychiatric:        Mood and Affect: Mood normal.        Behavior: Behavior normal.     ED Results / Procedures / Treatments   Labs (all labs ordered are listed, but only abnormal results are displayed) Labs Reviewed  COMPREHENSIVE METABOLIC PANEL - Abnormal; Notable  for the following components:      Result Value   Sodium 134 (*)    Calcium 8.6 (*)    Albumin 2.6 (*)    AST 12 (*)    All other components within normal limits  CBC - Abnormal; Notable for the following components:   WBC 3.9 (*)    All other components within normal limits  URINALYSIS, ROUTINE W REFLEX MICROSCOPIC - Abnormal; Notable for the following components:   Color, Urine AMBER (*)    APPearance HAZY (*)    Bilirubin Urine SMALL (*)    Ketones, ur >80 (*)    All other components within normal limits  LIPASE, BLOOD  PREGNANCY, URINE    EKG None  Radiology No results found.  Procedures Procedures   Medications Ordered in ED Medications  morphine 4 MG/ML injection 4 mg (4 mg Intravenous Given 04/26/21 1655)  ondansetron (ZOFRAN) injection 4 mg (4 mg Intravenous Given 04/26/21 1654)  lactated ringers bolus 1,000 mL (0 mLs Intravenous Stopped 04/26/21 1802)  dicyclomine (BENTYL) capsule 20 mg (20 mg Oral Given 04/26/21 1723)  predniSONE (DELTASONE) tablet 60 mg (60 mg Oral Given 04/26/21 1801)    ED Course  I have reviewed the triage vital signs and the nursing notes.  Pertinent labs & imaging results that were available during my care of the patient were reviewed by me and considered in my medical decision making (see chart for details).     MDM Rules/Calculators/A&P                          Patient is a 25 year old female with past medical history of Crohn's disease has been lost to follow-up by [facility].  Has been on prednisone in the past.  She feels that she has been having a Crohn's flare which is what prompted her to come to the ER today.  She states she has had nausea vomiting diarrhea for 2 weeks.  No melena or hematochezia.  No bloody or bilious emesis.  Physical exam is notable for dry appearing oral mucosa and some diffuse generalized abdominal tenderness.  No guarding or rebound.  Urinalysis notable for some ketones and hazy appearance consistent with dehydration and poor p.o. intake.  CMP is notable for normal bicarb and BS of 72 Not consistent with DKA.  No anion gap.  CMP without any significant abnormalities.  No hyponatremia or hypokalemia.  Lipase within normal limits no pancreatitis suspicion.  CBC with leukopenia consistent with viral illness although this may also still be Crohn's flare.   I discussed this case with my attending physician who cosigned this note including patient's presenting symptoms, physical exam, and planned diagnostics and interventions. Attending physician stated agreement with plan or made changes to plan which were implemented.   Given the patient had CT scan 6 months ago that was without abnormality during similar symptoms we will hold off on repeat abdominal CT scan given that she had a 25 year old female with Crohn's disease and may incur numerous additional CTs over the years ahead of her.  She has not had any surgeries.  She is having good bowel movements which makes obstruction unlikely.  No chest pain or shortness of breath.  Will discharge home with prednisone, Bentyl and Zofran.  She will follow-up with gastroenterology.  She understands importance of close follow-up.  Return precautions given.  Patient tolerated p.o. prior to discharge.  Final Clinical Impression(s) / ED  Diagnoses Final diagnoses:  Generalized abdominal pain    Rx / DC Orders ED Discharge Orders         Ordered    predniSONE (DELTASONE) 10 MG tablet  Daily with breakfast        04/26/21 1749    dicyclomine (BENTYL) 20 MG tablet  2 times daily        04/26/21 1749    ondansetron (ZOFRAN ODT) 4 MG disintegrating tablet  Every 8 hours PRN        04/26/21 1749           Tedd Sias, [place] 04/26/21 1919    Sherwood Gambler, MD 04/27/21 1559

## 2021-04-29 ENCOUNTER — Encounter (HOSPITAL_BASED_OUTPATIENT_CLINIC_OR_DEPARTMENT_OTHER): Payer: Self-pay | Admitting: *Deleted

## 2021-04-29 ENCOUNTER — Other Ambulatory Visit: Payer: Self-pay

## 2021-04-29 ENCOUNTER — Inpatient Hospital Stay (HOSPITAL_BASED_OUTPATIENT_CLINIC_OR_DEPARTMENT_OTHER)
Admission: EM | Admit: 2021-04-29 | Discharge: 2021-05-05 | DRG: 387 | Disposition: A | Discharge status: Home / Self Care | Payer: Self-pay | Attending: Internal Medicine | Admitting: Internal Medicine

## 2021-04-29 DIAGNOSIS — I491 Atrial premature depolarization: Secondary | ICD-10-CM

## 2021-04-29 DIAGNOSIS — D75839 Thrombocytosis, unspecified: Secondary | ICD-10-CM | POA: Diagnosis present

## 2021-04-29 DIAGNOSIS — Z20822 Contact with and (suspected) exposure to covid-19: Secondary | ICD-10-CM | POA: Diagnosis present

## 2021-04-29 DIAGNOSIS — R112 Nausea with vomiting, unspecified: Secondary | ICD-10-CM

## 2021-04-29 DIAGNOSIS — R739 Hyperglycemia, unspecified: Secondary | ICD-10-CM | POA: Diagnosis not present

## 2021-04-29 DIAGNOSIS — K449 Diaphragmatic hernia without obstruction or gangrene: Secondary | ICD-10-CM | POA: Diagnosis present

## 2021-04-29 DIAGNOSIS — T380X5A Adverse effect of glucocorticoids and synthetic analogues, initial encounter: Secondary | ICD-10-CM | POA: Diagnosis not present

## 2021-04-29 DIAGNOSIS — D500 Iron deficiency anemia secondary to blood loss (chronic): Secondary | ICD-10-CM

## 2021-04-29 DIAGNOSIS — Z79899 Other long term (current) drug therapy: Secondary | ICD-10-CM

## 2021-04-29 DIAGNOSIS — R001 Bradycardia, unspecified: Secondary | ICD-10-CM

## 2021-04-29 DIAGNOSIS — K5000 Crohn's disease of small intestine without complications: Principal | ICD-10-CM | POA: Diagnosis present

## 2021-04-29 DIAGNOSIS — K529 Noninfective gastroenteritis and colitis, unspecified: Secondary | ICD-10-CM | POA: Diagnosis present

## 2021-04-29 DIAGNOSIS — R932 Abnormal findings on diagnostic imaging of liver and biliary tract: Secondary | ICD-10-CM

## 2021-04-29 LAB — PREGNANCY, URINE: Preg Test, Ur: NEGATIVE

## 2021-04-29 MED ORDER — ONDANSETRON HCL 4 MG/2ML IJ SOLN
4.0000 mg | Freq: Once | INTRAMUSCULAR | Status: AC
  Administered 2021-04-30: 4 mg via INTRAVENOUS
  Filled 2021-04-29: qty 2

## 2021-04-29 MED ORDER — LACTATED RINGERS IV BOLUS
1000.0000 mL | Freq: Once | INTRAVENOUS | Status: AC
  Administered 2021-04-30: 1000 mL via INTRAVENOUS

## 2021-04-29 MED ORDER — PANTOPRAZOLE SODIUM 40 MG IV SOLR
40.0000 mg | Freq: Once | INTRAVENOUS | Status: AC
  Administered 2021-04-30: 40 mg via INTRAVENOUS
  Filled 2021-04-29: qty 40

## 2021-04-29 MED ORDER — MORPHINE SULFATE (PF) 4 MG/ML IV SOLN
4.0000 mg | Freq: Once | INTRAVENOUS | Status: AC
Start: 2021-04-29 — End: 2021-04-30
  Administered 2021-04-30: 4 mg via INTRAVENOUS
  Filled 2021-04-29: qty 1

## 2021-04-29 NOTE — ED Provider Notes (Addendum)
[facility] [facility] DEPT [facility] Provider Note: Jennifer Spurling, MD, FACEP  CSN: [id number] MRN: [id number] ARRIVAL: 04/29/21 at 2145 ROOM: [place]  Abdominal Pain   HISTORY OF PRESENT ILLNESS  04/29/21 10:57 PM Jennifer Cain is a 25 y.o. female with a history of Crohn's disease diagnosed in 2019.  She was seen in the ED 04/26/2021 for abdominal pain, nausea, vomiting and diarrhea.  She reported nonbloody stools without melena.  Her symptoms have been present for 2 weeks.  Her pain was worse with eating and better with vomiting.  She was able to keep fluids down but not solid food.  C-Met and lipase were unremarkable.  No imaging studies were performed as she had a CT scan about 6 months ago.  She was discharged on prednisone 10 mg daily, dicyclomine and Zofran.  She returns stating the pain persist.  It is diffuse but most prominent in the epigastrium.  It is dull in nature and radiates up into her lower chest.  She rates it as a 10 out of 10.  It is worse when she eats or when she lies supine.  She continues to have vomiting anytime she tries to eat or drink anything.  The diarrhea has abated.   Past Medical History:  Diagnosis Date   Crohn disease [facility])     Past Surgical History:  Procedure Laterality Date   BIOPSY  07/20/2018   Procedure: BIOPSY;  Surgeon: Carol Ada, MD;  Location: [facility];  Service: Endoscopy;;   COLONOSCOPY WITH PROPOFOL N/A 07/20/2018   Procedure: COLONOSCOPY WITH PROPOFOL;  Surgeon: Carol Ada, MD;  Location: [facility];  Service: Endoscopy;  Laterality: N/A;   ESOPHAGOGASTRODUODENOSCOPY (EGD) WITH PROPOFOL N/A 07/20/2018   Procedure: ESOPHAGOGASTRODUODENOSCOPY (EGD) WITH PROPOFOL;  Surgeon: Carol Ada, MD;  Location: [facility];  Service: Endoscopy;  Laterality: N/A;    Family History  Problem Relation Age of Onset   GI Disease Neg Hx    Autoimmune disease Neg Hx     Social History   Tobacco Use   Smoking status: Never    Smokeless tobacco: Never  Vaping Use   Vaping Use: Never used  Substance Use Topics   Alcohol use: Never   Drug use: Never    Prior to Admission medications   Medication Sig Start Date End Date Taking? Authorizing Provider  acetaminophen (TYLENOL) 325 MG tablet Take 2 tablets (650 mg total) by mouth every 6 (six) hours as needed for mild pain (or Fever >/= 101). 07/24/18   Eugenie Filler, MD  benzonatate (TESSALON) 100 MG capsule Take 1 capsule (100 mg total) by mouth every 8 (eight) hours. 11/29/20   Caccavale, Sophia, PA-C  calcium-vitamin D (OSCAL 500/200 D-3) 500-200 MG-UNIT tablet Take 1 tablet by mouth 2 (two) times daily. 07/24/18 08/23/18  Eugenie Filler, MD  dicyclomine (BENTYL) 20 MG tablet Take 1 tablet (20 mg total) by mouth 2 (two) times daily. 04/26/21   Tedd Sias, PA  feeding supplement, ENSURE ENLIVE, (ENSURE ENLIVE) LIQD Take 237 mLs by mouth 2 (two) times daily between meals. 07/24/18   Eugenie Filler, MD  ferrous sulfate 325 (65 FE) MG tablet Take 1 tablet (325 mg total) by mouth daily with breakfast. 08/09/18   Ladell Pier, MD  lidocaine (XYLOCAINE) 5 % ointment Apply 1 application topically as needed. 02/05/21   Robinson, [place] N, PA-C  Multiple Vitamin (MULTIVITAMIN WITH MINERALS) TABS tablet Take 1 tablet by mouth daily. 07/25/18   [place] Silos,  Malachy Moan, MD  ondansetron (ZOFRAN ODT) 4 MG disintegrating tablet Take 1 tablet (4 mg total) by mouth every 8 (eight) hours as needed for nausea or vomiting. 04/26/21   Tedd Sias, PA  predniSONE (DELTASONE) 10 MG tablet Take 4 tablets (40 mg total) by mouth daily with breakfast. 04/26/21   Tedd Sias, PA    Allergies Patient has no known allergies.   REVIEW OF SYSTEMS  Negative except as noted here or in the History of Present Illness.   PHYSICAL EXAMINATION  Initial Vital Signs Blood pressure (!) 125/92, pulse 65, temperature 97.9 F (36.6 C), temperature source Oral, resp. rate 14, height 5' 4"   (1.626 m), weight 64.4 kg, last menstrual period 04/19/2021, SpO2 98 %.  Examination General: Well-developed, well-nourished female in no acute distress; appearance consistent with age of record HENT: normocephalic; atraumatic Eyes: pupils equal, round and reactive to light; extraocular muscles intact Neck: supple Heart: Bradycardia with frequent PACs Lungs: clear to auscultation bilaterally Abdomen: soft; nondistended; diffusely tender most prominent in the epigastrium; no masses or hepatosplenomegaly; bowel sounds present Extremities: No deformity; full range of motion; pulses normal Neurologic: Awake, alert and oriented; motor function intact in all extremities and symmetric; no facial droop Skin: Warm and dry Psychiatric: Flat affect   RESULTS  Summary of this visit's results, reviewed and interpreted by myself:   EKG Interpretation  Date/Time:  Friday April 29 2021 22:06:19 EDT Ventricular Rate:  52 PR Interval:  154 QRS Duration: 78 QT Interval:  410 QTC Calculation: 382 R Axis:   56 Text Interpretation: Sinus rhythm Atrial premature complexes in couplets Confirmed by Quintella Reichert [phone]) on 04/29/2021 10:56:03 PM        Laboratory Studies: Results for orders placed or performed during the hospital encounter of 04/29/21 (from the past 24 hour(s))  Urinalysis, Routine w reflex microscopic Urine, Clean Catch     Status: Abnormal   Collection Time: 04/29/21 11:29 PM  Result Value Ref Range   Color, Urine YELLOW YELLOW   APPearance HAZY (A) CLEAR   Specific Gravity, Urine 1.015 1.005 - 1.030   pH 6.5 5.0 - 8.0   Glucose, UA NEGATIVE NEGATIVE mg/dL   Hgb urine dipstick NEGATIVE NEGATIVE   Bilirubin Urine SMALL (A) NEGATIVE   Ketones, ur NEGATIVE NEGATIVE mg/dL   Protein, ur NEGATIVE NEGATIVE mg/dL   Nitrite NEGATIVE NEGATIVE   Leukocytes,Ua NEGATIVE NEGATIVE  Pregnancy, urine     Status: None   Collection Time: 04/29/21 11:29 PM  Result Value Ref Range   Preg  Test, Ur NEGATIVE NEGATIVE  Resp Panel by RT-PCR (Flu A&B, Covid) Nasopharyngeal Swab     Status: None   Collection Time: 04/30/21  2:31 AM   Specimen: Nasopharyngeal Swab; Nasopharyngeal(NP) swabs in vial transport medium  Result Value Ref Range   SARS Coronavirus 2 by RT PCR NEGATIVE NEGATIVE   Influenza A by PCR NEGATIVE NEGATIVE   Influenza B by PCR NEGATIVE NEGATIVE   Imaging Studies: CT ABDOMEN PELVIS W CONTRAST  Result Date: 04/30/2021 CLINICAL DATA:  Abdominal and dull left chest pain, history of Crohn's EXAM: CT ABDOMEN AND PELVIS WITH CONTRAST TECHNIQUE: Multidetector CT imaging of the abdomen and pelvis was performed using the standard protocol following bolus administration of intravenous contrast. CONTRAST:  148m OMNIPAQUE IOHEXOL 300 MG/ML  SOLN COMPARISON:  CT 02/05/2021, 10/12/2020 FINDINGS: Lower chest: Lung bases are clear. Normal heart size. No pericardial effusion. Hepatobiliary: Heterogeneous hepatic enhancement numerous poorly enhancing subcapsular foci throughout the liver.  While this may be accentuated by contrast timing, such appearance was not present on comparison priors and could reflect intrinsic liver processes or perfusion related changes. No other concerning focal liver abnormality. Liver surface contour remains smooth. Findings on a background of diffuse hepatic hypoattenuation. Normal gallbladder and biliary tree. Pancreas: No pancreatic ductal dilatation or surrounding inflammatory changes. Spleen: Normal in size. No concerning splenic lesions. Adrenals/Urinary Tract: Normal adrenal glands. Kidneys are normally located with symmetric enhancement. No suspicious renal lesion, urolithiasis or hydronephrosis. Urinary bladder is unremarkable for the degree of distention. Stomach/Bowel: Distal esophagus, stomach and duodenum are unremarkable. Proximal small bowel is free of acute abnormality. Suspect some mild thickening versus underdistention of the terminal ileum. Normal  appendix seen in the right lower quadrant. No colonic dilatation or wall thickening. No evidence of bowel obstruction. Vascular/Lymphatic: No significant vascular findings are present. Reactive appearing low mesenteric lymph nodes. No pathologically enlarged abdominal or pelvic lymph nodes. Reproductive: Uterine displaced from towards the right pelvic sidewall, unchanged from comparison prior. No concerning adnexal masses. Other: No abdominopelvic free fluid or free gas. No bowel containing hernias. Musculoskeletal: No acute osseous abnormality or suspicious osseous lesion. IMPRESSION: 1. Heterogeneous hepatic enhancement with poorly enhancing subcapsular foci throughout the liver, etiology is unclear. Question intrinsic liver disease versus perfusion related changes or contrast timing. Findings on a background of more diffuse hypoattenuation which could be seen in the setting of hepatic steatosis as well. Consider further characterization with serologies and if abnormal, hepatic MRI could be obtained. 2. Mild mural thickening versus underdistention of the terminal ileum. Such appearance could reflect a terminal ileitis particularly in the setting of Crohn's disease. Some reactive low mesenteric adenopathy is present as well. No organized abscess or collection or other discernible complication. These results were called by telephone at the time of interpretation on 04/30/2021 at 2:09 am to provider [facility] , who verbally acknowledged these results. Electronically Signed   By: Lovena Le M.D.   On: 04/30/2021 02:07    ED COURSE and MDM  Nursing notes, initial and subsequent vitals signs, including pulse oximetry, reviewed and interpreted by myself.  Vitals:   04/29/21 2354 04/30/21 0055 04/30/21 0145 04/30/21 0400  BP: (!) 148/98 (!) 138/103 (!) 153/97 (!) 121/91  Pulse: (!) 55 (!) 54 61 (!) 50  Resp: 19 14 18 15   Temp:      TempSrc:      SpO2: 100% 96% 100% 97%  Weight:      Height:        Medications  pantoprazole (PROTONIX) injection 40 mg (40 mg Intravenous Given 04/30/21 0036)  lactated ringers bolus 1,000 mL ( Intravenous Stopped 04/30/21 0302)  ondansetron (ZOFRAN) injection 4 mg (4 mg Intravenous Given 04/30/21 0036)  morphine 4 MG/ML injection 4 mg (4 mg Intravenous Given 04/30/21 0036)  iohexol (OMNIPAQUE) 300 MG/ML solution 100 mL (100 mLs Intravenous Contrast Given 04/30/21 0129)   2:29 AM Given CT findings and the fact that the patient has been unable to keep anything on her stomach for several days despite outpatient treatment we will get her admitted.   PROCEDURES  Procedures   ED DIAGNOSES     ICD-10-CM   1. Intractable nausea and vomiting  R11.2     2. Crohn's disease involving terminal ileum ([place])  K50.00     3. Abnormal liver CT  R93.2     4. Premature atrial contractions  I49.1     5. Sinus bradycardia  R00.1  Kasiah Manka, Jenny Reichmann, MD 04/30/21 7619    Shanon Rosser, MD 04/30/21 [phone]

## 2021-04-29 NOTE — ED Notes (Signed)
Pt requesting ultrasound for IV insertion.

## 2021-04-29 NOTE — ED Triage Notes (Signed)
Abdominal pain with dull pain in her left chest. She was seen for same 3 days ago. Hx of crohns dx.

## 2021-04-30 ENCOUNTER — Emergency Department (HOSPITAL_BASED_OUTPATIENT_CLINIC_OR_DEPARTMENT_OTHER): Payer: Self-pay

## 2021-04-30 DIAGNOSIS — K529 Noninfective gastroenteritis and colitis, unspecified: Secondary | ICD-10-CM

## 2021-04-30 DIAGNOSIS — K5090 Crohn's disease, unspecified, without complications: Secondary | ICD-10-CM

## 2021-04-30 LAB — CBC WITH DIFFERENTIAL/PLATELET
Abs Immature Granulocytes: 0.05 10*3/uL (ref 0.00–0.07)
Basophils Absolute: 0 10*3/uL (ref 0.0–0.1)
Basophils Relative: 0 %
Eosinophils Absolute: 0 10*3/uL (ref 0.0–0.5)
Eosinophils Relative: 0 %
HCT: 34.1 % — ABNORMAL LOW (ref 36.0–46.0)
Hemoglobin: 10.9 g/dL — ABNORMAL LOW (ref 12.0–15.0)
Immature Granulocytes: 1 %
Lymphocytes Relative: 22 %
Lymphs Abs: 1.6 10*3/uL (ref 0.7–4.0)
MCH: 27.7 pg (ref 26.0–34.0)
MCHC: 32 g/dL (ref 30.0–36.0)
MCV: 86.5 fL (ref 80.0–100.0)
Monocytes Absolute: 0.4 10*3/uL (ref 0.1–1.0)
Monocytes Relative: 5 %
Neutro Abs: 5.3 10*3/uL (ref 1.7–7.7)
Neutrophils Relative %: 72 %
Platelets: 437 10*3/uL — ABNORMAL HIGH (ref 150–400)
RBC: 3.94 MIL/uL (ref 3.87–5.11)
RDW: 14.5 % (ref 11.5–15.5)
WBC: 7.4 10*3/uL (ref 4.0–10.5)
nRBC: 0 % (ref 0.0–0.2)

## 2021-04-30 LAB — COMPREHENSIVE METABOLIC PANEL
ALT: 8 U/L (ref 0–44)
AST: 11 U/L — ABNORMAL LOW (ref 15–41)
Albumin: 2.8 g/dL — ABNORMAL LOW (ref 3.5–5.0)
Alkaline Phosphatase: 65 U/L (ref 38–126)
Anion gap: 9 (ref 5–15)
BUN: 12 mg/dL (ref 6–20)
CO2: 29 mmol/L (ref 22–32)
Calcium: 9.2 mg/dL (ref 8.9–10.3)
Chloride: 100 mmol/L (ref 98–111)
Creatinine, Ser: 0.81 mg/dL (ref 0.44–1.00)
GFR, Estimated: >60 mL/min (ref >60)
Glucose, Bld: 96 mg/dL (ref 70–99)
Potassium: 3.9 mmol/L (ref 3.5–5.1)
Sodium: 138 mmol/L (ref 135–145)
Total Bilirubin: 0.2 mg/dL — ABNORMAL LOW (ref 0.3–1.2)
Total Protein: 8.3 g/dL — ABNORMAL HIGH (ref 6.5–8.1)

## 2021-04-30 LAB — URINALYSIS, ROUTINE W REFLEX MICROSCOPIC
Glucose, UA: NEGATIVE mg/dL
Hgb urine dipstick: NEGATIVE
Ketones, ur: NEGATIVE mg/dL
Leukocytes,Ua: NEGATIVE
Nitrite: NEGATIVE
Protein, ur: NEGATIVE mg/dL
Specific Gravity, Urine: 1.015 (ref 1.005–1.030)
pH: 6.5 (ref 5.0–8.0)

## 2021-04-30 LAB — LIPASE, BLOOD: Lipase: 24 U/L (ref 11–51)

## 2021-04-30 LAB — RESP PANEL BY RT-PCR (FLU A&B, COVID) ARPGX2
Influenza A by PCR: NEGATIVE
Influenza B by PCR: NEGATIVE
SARS Coronavirus 2 by RT PCR: NEGATIVE

## 2021-04-30 MED ORDER — OXYCODONE HCL 5 MG PO TABS: 5.0000 mg | ORAL_TABLET | ORAL | Status: DC | PRN

## 2021-04-30 MED ORDER — METHYLPREDNISOLONE SODIUM SUCC 40 MG IJ SOLR
20.0000 mg | Freq: Two times a day (BID) | INTRAMUSCULAR | Status: DC
  Administered 2021-04-30 – 2021-05-04 (×9): 20 mg via INTRAVENOUS
  Filled 2021-04-30 (×9): qty 1

## 2021-04-30 MED ORDER — IOHEXOL 300 MG/ML  SOLN
100.0000 mL | Freq: Once | INTRAMUSCULAR | Status: AC | PRN
  Administered 2021-04-30: 100 mL via INTRAVENOUS

## 2021-04-30 MED ORDER — SODIUM CHLORIDE 0.9 % IV SOLN: Freq: Once | INTRAVENOUS | Status: AC

## 2021-04-30 MED ORDER — MORPHINE SULFATE (PF) 4 MG/ML IV SOLN
4.0000 mg | INTRAVENOUS | Status: DC | PRN
  Administered 2021-04-30: 4 mg via INTRAVENOUS
  Filled 2021-04-30: qty 1

## 2021-04-30 MED ORDER — ACETAMINOPHEN 650 MG RE SUPP: 650.0000 mg | Freq: Four times a day (QID) | RECTAL | Status: DC | PRN

## 2021-04-30 MED ORDER — DICYCLOMINE HCL 20 MG PO TABS
20.0000 mg | ORAL_TABLET | Freq: Two times a day (BID) | ORAL | Status: DC
  Administered 2021-04-30 – 2021-05-05 (×10): 20 mg via ORAL
  Filled 2021-04-30 (×10): qty 1

## 2021-04-30 MED ORDER — PANTOPRAZOLE SODIUM 40 MG IV SOLR
40.0000 mg | INTRAVENOUS | Status: DC
  Administered 2021-04-30 – 2021-05-04 (×5): 40 mg via INTRAVENOUS
  Filled 2021-04-30 (×5): qty 40

## 2021-04-30 MED ORDER — PROCHLORPERAZINE EDISYLATE 10 MG/2ML IJ SOLN: 10.0000 mg | Freq: Four times a day (QID) | INTRAMUSCULAR | Status: DC | PRN

## 2021-04-30 MED ORDER — ACETAMINOPHEN 325 MG PO TABS: 650.0000 mg | ORAL_TABLET | Freq: Four times a day (QID) | ORAL | Status: DC | PRN

## 2021-04-30 NOTE — H&P (Signed)
History and Physical    Jennifer Cain BWG:[id number] DOB: [birth year] DOA: 04/29/2021  PCP: Pcp, No  Patient coming from: Home  Chief Complaint: N/V  HPI: Jennifer Cain is a 25 y.o. female with medical history significant of Crohns disease. Presenting with 2 weeks of N/V and abdominal pain. This is a gnawing pain in the epigastric area that radiates to the right chest, and right abdomen. It comes in waves and is worsened with eating. She sought help in the ED on 04/26/21. She was given a prednisone, bentyl, and zofran. She states that her symptoms didn't really improve. She found that her pain worsened yesterday, so she returned to the ED for help. She denies any other aggravating or alleviating factors.   ED Course: CT was consistent w/ ileitis. No abscess seen. She was given fluids, pain control, anti-emetics. [facility] was called for admission.   Review of Systems:  Denies dyspnea, palpitations, lightheadedness, syncopal episodes, diarrhea, fever, hematemesis. Review of systems is otherwise negative for all not mentioned in HPI.   PMHx Past Medical History:  Diagnosis Date   Crohn disease [facility])     PSHx Past Surgical History:  Procedure Laterality Date   BIOPSY  07/20/2018   Procedure: BIOPSY;  Surgeon: Carol Ada, MD;  Location: [facility];  Service: Endoscopy;;   COLONOSCOPY WITH PROPOFOL N/A 07/20/2018   Procedure: COLONOSCOPY WITH PROPOFOL;  Surgeon: Carol Ada, MD;  Location: [facility];  Service: Endoscopy;  Laterality: N/A;   ESOPHAGOGASTRODUODENOSCOPY (EGD) WITH PROPOFOL N/A 07/20/2018   Procedure: ESOPHAGOGASTRODUODENOSCOPY (EGD) WITH PROPOFOL;  Surgeon: Carol Ada, MD;  Location: [facility];  Service: Endoscopy;  Laterality: N/A;    SocHx  reports that she has never smoked. She has never used smokeless tobacco. She reports that she does not drink alcohol and does not use drugs.  No Known Allergies  FamHx Family History  Problem Relation Age of Onset   GI Disease  Neg Hx    Autoimmune disease Neg Hx     Prior to Admission medications   Medication Sig Start Date End Date Taking? Authorizing Provider  acetaminophen (TYLENOL) 325 MG tablet Take 2 tablets (650 mg total) by mouth every 6 (six) hours as needed for mild pain (or Fever >/= 101). 07/24/18   Eugenie Filler, MD  benzonatate (TESSALON) 100 MG capsule Take 1 capsule (100 mg total) by mouth every 8 (eight) hours. 11/29/20   Caccavale, Sophia, PA-C  calcium-vitamin D (OSCAL 500/200 D-3) 500-200 MG-UNIT tablet Take 1 tablet by mouth 2 (two) times daily. 07/24/18 08/23/18  Eugenie Filler, MD  dicyclomine (BENTYL) 20 MG tablet Take 1 tablet (20 mg total) by mouth 2 (two) times daily. 04/26/21   Tedd Sias, PA  feeding supplement, ENSURE ENLIVE, (ENSURE ENLIVE) LIQD Take 237 mLs by mouth 2 (two) times daily between meals. 07/24/18   Eugenie Filler, MD  ferrous sulfate 325 (65 FE) MG tablet Take 1 tablet (325 mg total) by mouth daily with breakfast. 08/09/18   Ladell Pier, MD  lidocaine (XYLOCAINE) 5 % ointment Apply 1 application topically as needed. 02/05/21   Robinson, [place] N, PA-C  Multiple Vitamin (MULTIVITAMIN WITH MINERALS) TABS tablet Take 1 tablet by mouth daily. 07/25/18   Eugenie Filler, MD  ondansetron (ZOFRAN ODT) 4 MG disintegrating tablet Take 1 tablet (4 mg total) by mouth every 8 (eight) hours as needed for nausea or vomiting. 04/26/21   Tedd Sias, PA  predniSONE (DELTASONE) 10 MG tablet Take 4 tablets (  40 mg total) by mouth daily with breakfast. 04/26/21   Tedd Sias, PA    Physical Exam: Vitals:   04/30/21 1200 04/30/21 1333 04/30/21 1400 04/30/21 1500  BP: (!) 121/95 (!) 113/58 112/82 (!) 117/95  Pulse: (!) 52 78  67  Resp: 14 18 17 18   Temp:   98 F (36.7 C) (!) 97.3 F (36.3 C)  TempSrc:   Oral Oral  SpO2: 99% 98% 98% 98%  Weight:      Height:        General: 25 y.o. female resting in bed in NAD Eyes: PERRL, normal sclera ENMT: Nares patent w/o  discharge, orophaynx clear, dentition normal, ears w/o discharge/lesions/ulcers Neck: Supple, trachea midline Cardiovascular: RRR, +S1, S2, no m/g/r, equal pulses throughout Respiratory: CTABL, no w/r/r, normal WOB GI: BS+, ND, epigastric/RUQ/RLQ TTP, no masses noted, no organomegaly noted MSK: No e/c/c Skin: No rashes, bruises, ulcerations noted Neuro: A&O x 3, no focal deficits Psyc: Appropriate interaction and affect, calm/cooperative  Labs on Admission: I have personally reviewed following labs and imaging studies  CBC: Recent Labs  Lab 04/26/21 1458 04/29/21 0032  WBC 3.9* 7.4  NEUTROABS  --  5.3  HGB 12.0 10.9*  HCT 36.6 34.1*  MCV 85.9 86.5  PLT 365 938*   Basic Metabolic Panel: Recent Labs  Lab 04/26/21 1458 04/29/21 0032  NA 134* 138  K 4.6 3.9  CL 101 100  CO2 25 29  GLUCOSE 72 96  BUN 7 12  CREATININE 0.47 0.81  CALCIUM 8.6* 9.2   GFR: Estimated Creatinine Clearance: 91.7 mL/min (by C-G formula based on SCr of 0.81 mg/dL). Liver Function Tests: Recent Labs  Lab 04/26/21 1458 04/29/21 0032  AST 12* 11*  ALT 9 8  ALKPHOS 74 65  BILITOT 0.5 0.2*  PROT 8.0 8.3*  ALBUMIN 2.6* 2.8*   Recent Labs  Lab 04/26/21 1458 04/29/21 0032  LIPASE 20 24   No results for input(s): AMMONIA in the last 168 hours. Coagulation Profile: No results for input(s): INR, PROTIME in the last 168 hours. Cardiac Enzymes: No results for input(s): CKTOTAL, CKMB, CKMBINDEX, TROPONINI in the last 168 hours. BNP (last 3 results) No results for input(s): PROBNP in the last 8760 hours. HbA1C: No results for input(s): HGBA1C in the last 72 hours. CBG: No results for input(s): GLUCAP in the last 168 hours. Lipid Profile: No results for input(s): CHOL, HDL, LDLCALC, TRIG, CHOLHDL, LDLDIRECT in the last 72 hours. Thyroid Function Tests: No results for input(s): TSH, T4TOTAL, FREET4, T3FREE, THYROIDAB in the last 72 hours. Anemia Panel: No results for input(s): VITAMINB12,  FOLATE, FERRITIN, TIBC, IRON, RETICCTPCT in the last 72 hours. Urine analysis:    Component Value Date/Time   COLORURINE YELLOW 04/29/2021 2329   APPEARANCEUR HAZY (A) 04/29/2021 2329   LABSPEC 1.015 04/29/2021 2329   PHURINE 6.5 04/29/2021 2329   GLUCOSEU NEGATIVE 04/29/2021 2329   HGBUR NEGATIVE 04/29/2021 2329   BILIRUBINUR SMALL (A) 04/29/2021 2329   KETONESUR NEGATIVE 04/29/2021 2329   PROTEINUR NEGATIVE 04/29/2021 2329   NITRITE NEGATIVE 04/29/2021 2329   LEUKOCYTESUR NEGATIVE 04/29/2021 2329    Radiological Exams on Admission: CT ABDOMEN PELVIS W CONTRAST  Result Date: 04/30/2021 CLINICAL DATA:  Abdominal and dull left chest pain, history of Crohn's EXAM: CT ABDOMEN AND PELVIS WITH CONTRAST TECHNIQUE: Multidetector CT imaging of the abdomen and pelvis was performed using the standard protocol following bolus administration of intravenous contrast. CONTRAST:  116m OMNIPAQUE IOHEXOL 300 MG/ML  SOLN  COMPARISON:  CT 02/05/2021, 10/12/2020 FINDINGS: Lower chest: Lung bases are clear. Normal heart size. No pericardial effusion. Hepatobiliary: Heterogeneous hepatic enhancement numerous poorly enhancing subcapsular foci throughout the liver. While this may be accentuated by contrast timing, such appearance was not present on comparison priors and could reflect intrinsic liver processes or perfusion related changes. No other concerning focal liver abnormality. Liver surface contour remains smooth. Findings on a background of diffuse hepatic hypoattenuation. Normal gallbladder and biliary tree. Pancreas: No pancreatic ductal dilatation or surrounding inflammatory changes. Spleen: Normal in size. No concerning splenic lesions. Adrenals/Urinary Tract: Normal adrenal glands. Kidneys are normally located with symmetric enhancement. No suspicious renal lesion, urolithiasis or hydronephrosis. Urinary bladder is unremarkable for the degree of distention. Stomach/Bowel: Distal esophagus, stomach and  duodenum are unremarkable. Proximal small bowel is free of acute abnormality. Suspect some mild thickening versus underdistention of the terminal ileum. Normal appendix seen in the right lower quadrant. No colonic dilatation or wall thickening. No evidence of bowel obstruction. Vascular/Lymphatic: No significant vascular findings are present. Reactive appearing low mesenteric lymph nodes. No pathologically enlarged abdominal or pelvic lymph nodes. Reproductive: Uterine displaced from towards the right pelvic sidewall, unchanged from comparison prior. No concerning adnexal masses. Other: No abdominopelvic free fluid or free gas. No bowel containing hernias. Musculoskeletal: No acute osseous abnormality or suspicious osseous lesion. IMPRESSION: 1. Heterogeneous hepatic enhancement with poorly enhancing subcapsular foci throughout the liver, etiology is unclear. Question intrinsic liver disease versus perfusion related changes or contrast timing. Findings on a background of more diffuse hypoattenuation which could be seen in the setting of hepatic steatosis as well. Consider further characterization with serologies and if abnormal, hepatic MRI could be obtained. 2. Mild mural thickening versus underdistention of the terminal ileum. Such appearance could reflect a terminal ileitis particularly in the setting of Crohn's disease. Some reactive low mesenteric adenopathy is present as well. No organized abscess or collection or other discernible complication. These results were called by telephone at the time of interpretation on 04/30/2021 at 2:09 am to provider [facility] , who verbally acknowledged these results. Electronically Signed   By: Lovena Le M.D.   On: 04/30/2021 02:07    EKG: Independently reviewed. Sinus, no st elevations  Assessment/Plan Ileitis Crohn's flare     - admitted to inpt, med-surg     - follows w/ [facility] (last seen 2019); have consulted them, appreciate their assistance     - No fever or  diarrhea; nominal WBC     - will start her on solumedrol 75m BID     - pain control w/ morphine; add compazine and protonix     - continue fluids     - CLD, advance as tolerated  Normocytic anemia     - check iron studies  DVT prophylaxis: SCDs  Code Status: FULL  Family Communication: None at bedside  Consults called: None   Status is: Inpatient  Remains inpatient appropriate because:Inpatient level of care appropriate due to severity of illness  Dispo: The patient is from: Home              Anticipated d/c is to: Home              Patient currently is not medically stable to d/c.   Difficult to place patient No  TJonnie FinnerDO [facility]  If 7PM-7AM, please contact night-coverage [email or web address]  04/30/2021, 3:06 PM

## 2021-04-30 NOTE — Plan of Care (Signed)
[facility] will assume care on arrival to accepting facility. Until arrival, care as per EDP. However, [facility] available 24/7 for questions and assistance.

## 2021-04-30 NOTE — ED Notes (Signed)
Pt ordered food from [employer] while awaiting bed placement, approved by Dr Dina Rich, no NPO orders in place by admitting team

## 2021-04-30 NOTE — ED Notes (Signed)
Report given to [facility] with [facility], ETA about 72mn

## 2021-04-30 NOTE — ED Notes (Signed)
Pt resting comfortably at this time, awaiting bed assignment.

## 2021-05-01 DIAGNOSIS — R112 Nausea with vomiting, unspecified: Secondary | ICD-10-CM

## 2021-05-01 DIAGNOSIS — D649 Anemia, unspecified: Secondary | ICD-10-CM

## 2021-05-01 DIAGNOSIS — R1084 Generalized abdominal pain: Secondary | ICD-10-CM

## 2021-05-01 DIAGNOSIS — K50019 Crohn's disease of small intestine with unspecified complications: Secondary | ICD-10-CM

## 2021-05-01 DIAGNOSIS — K5000 Crohn's disease of small intestine without complications: Principal | ICD-10-CM

## 2021-05-01 LAB — CBC
HCT: 32.5 % — ABNORMAL LOW (ref 36.0–46.0)
Hemoglobin: 10.3 g/dL — ABNORMAL LOW (ref 12.0–15.0)
MCH: 27.9 pg (ref 26.0–34.0)
MCHC: 31.7 g/dL (ref 30.0–36.0)
MCV: 88.1 fL (ref 80.0–100.0)
Platelets: 420 10*3/uL — ABNORMAL HIGH (ref 150–400)
RBC: 3.69 MIL/uL — ABNORMAL LOW (ref 3.87–5.11)
RDW: 14.6 % (ref 11.5–15.5)
WBC: 5.1 10*3/uL (ref 4.0–10.5)
nRBC: 0 % (ref 0.0–0.2)

## 2021-05-01 LAB — COMPREHENSIVE METABOLIC PANEL
ALT: 8 U/L (ref 0–44)
AST: 10 U/L — ABNORMAL LOW (ref 15–41)
Albumin: 2.3 g/dL — ABNORMAL LOW (ref 3.5–5.0)
Alkaline Phosphatase: 59 U/L (ref 38–126)
Anion gap: 9 (ref 5–15)
BUN: 12 mg/dL (ref 6–20)
CO2: 26 mmol/L (ref 22–32)
Calcium: 8.8 mg/dL — ABNORMAL LOW (ref 8.9–10.3)
Chloride: 103 mmol/L (ref 98–111)
Creatinine, Ser: 0.77 mg/dL (ref 0.44–1.00)
GFR, Estimated: >60 mL/min (ref >60)
Glucose, Bld: 100 mg/dL — ABNORMAL HIGH (ref 70–99)
Potassium: 4.6 mmol/L (ref 3.5–5.1)
Sodium: 138 mmol/L (ref 135–145)
Total Bilirubin: 0.2 mg/dL — ABNORMAL LOW (ref 0.3–1.2)
Total Protein: 6.7 g/dL (ref 6.5–8.1)

## 2021-05-01 LAB — IRON AND TIBC
Iron: 31 ug/dL (ref 28–170)
Saturation Ratios: 18 % (ref 10.4–31.8)
TIBC: 168 ug/dL — ABNORMAL LOW (ref 250–450)
UIBC: 137 ug/dL

## 2021-05-01 LAB — FERRITIN: Ferritin: 46 ng/mL (ref 11–307)

## 2021-05-01 MED ORDER — ENOXAPARIN SODIUM 40 MG/0.4ML IJ SOSY
40.0000 mg | PREFILLED_SYRINGE | INTRAMUSCULAR | Status: DC
  Administered 2021-05-01 – 2021-05-05 (×4): 40 mg via SUBCUTANEOUS
  Filled 2021-05-01 (×4): qty 0.4

## 2021-05-01 NOTE — Consult Note (Signed)
Referring Provider: Cherylann Ratel, DO        Primary Care Physician:  Pcp, No Primary Gastroenterologist: Dr. Bryan Lemma            Reason for Consultation: Crohn's flare                  ASSESSMENT /  PLAN   25 y.o. female with a past medical history of small bowel Crohn's disease (question of colonic involvement by biopsies in 2019 though acute inflammation only in the colon at that time) presenting with ongoing issues with abdominal pain, nausea, vomiting.  Crohn's ileitis with acute flare --symptoms consistent with a flare of Crohn's disease.  She had acute inflammation only in the colon at biopsy 3 years ago, it is possible that she has colonic involvement as well though CT did not support a robust colitis.  I discussed Crohn's with her today and the importance of follow-up with GI once she improves so that we can discuss more long-term maintenance therapy.  Goal remains long-term steroid free control. --Continue methylprednisolone 20 mg twice daily --She feels that she is more hungry and so I will try her on a soft diet --No imminent plans to repeat endoscopy/colonoscopy while hospitalized --She did report dark stools, monitor for recurrent dark stool or melena --Hemoglobin is down slightly from her baseline --Will need follow-up with Dr. Bryan Lemma after discharge --I added a fecal calprotectin --We will follow  2.  Normocytic anemia --iron studies at least TIBC, iron percent sat not consistent with iron deficiency.  I have added on a ferritin.  Monitor hemoglobin while here    HPI:     Jennifer Cain is a 25 y.o. female with a past medical history of small bowel Crohn's disease (question of colonic involvement by biopsies in 2019 though acute inflammation only in the colon at that time) presenting with ongoing issues with abdominal pain, nausea, vomiting.  She reports that over the last 2 weeks but really over the last 3 to 4 months she has been having issues with mid and lower  abdominal pain worse after eating.  Intermittent nausea and vomiting.  Loose stools on a regular basis particularly worse after eating.  Can have 4-5 stools per day when most severe.  Today her stools looked darker than she is ever seen them.  No red blood in stools.  No headache.  No rash.  No ocular complaint.  In the ER CT scan was performed which showed mild mural thickening versus underdistention of the TI with reactive low mesenteric adenopathy.  No fluid collections.  The liver was heterogeneous with poorly enhancing foci throughout the liver of unclear etiology.   Past Medical History:  Diagnosis Date   Crohn disease [facility])     Past Surgical History:  Procedure Laterality Date   BIOPSY  07/20/2018   Procedure: BIOPSY;  Surgeon: Carol Ada, MD;  Location: [facility];  Service: Endoscopy;;   COLONOSCOPY WITH PROPOFOL N/A 07/20/2018   Procedure: COLONOSCOPY WITH PROPOFOL;  Surgeon: Carol Ada, MD;  Location: [facility];  Service: Endoscopy;  Laterality: N/A;   ESOPHAGOGASTRODUODENOSCOPY (EGD) WITH PROPOFOL N/A 07/20/2018   Procedure: ESOPHAGOGASTRODUODENOSCOPY (EGD) WITH PROPOFOL;  Surgeon: Carol Ada, MD;  Location: [facility];  Service: Endoscopy;  Laterality: N/A;    Prior to Admission medications   Medication Sig Start Date End Date Taking? Authorizing Provider  acetaminophen (TYLENOL) 325 MG tablet Take 2 tablets (650 mg total) by mouth every 6 (six) hours as needed for  mild pain (or Fever >/= 101). 07/24/18  Yes Eugenie Filler, MD  dicyclomine (BENTYL) 20 MG tablet Take 1 tablet (20 mg total) by mouth 2 (two) times daily. 04/26/21  Yes Fondaw, Wylder S, PA  ondansetron (ZOFRAN ODT) 4 MG disintegrating tablet Take 1 tablet (4 mg total) by mouth every 8 (eight) hours as needed for nausea or vomiting. 04/26/21  Yes Fondaw, Wylder S, PA  predniSONE (DELTASONE) 10 MG tablet Take 4 tablets (40 mg total) by mouth daily with breakfast. 04/26/21  Yes Fondaw, Wylder S, PA   benzonatate (TESSALON) 100 MG capsule Take 1 capsule (100 mg total) by mouth every 8 (eight) hours. Patient not taking: Reported on 04/30/2021 11/29/20   Caccavale, Sophia, PA-C  calcium-vitamin D (OSCAL 500/200 D-3) 500-200 MG-UNIT tablet Take 1 tablet by mouth 2 (two) times daily. Patient not taking: Reported on 04/30/2021 07/24/18 08/23/18  Eugenie Filler, MD  feeding supplement, ENSURE ENLIVE, (ENSURE ENLIVE) LIQD Take 237 mLs by mouth 2 (two) times daily between meals. Patient not taking: Reported on 04/30/2021 07/24/18   Eugenie Filler, MD  ferrous sulfate 325 (65 FE) MG tablet Take 1 tablet (325 mg total) by mouth daily with breakfast. Patient not taking: Reported on 04/30/2021 08/09/18   Ladell Pier, MD  lidocaine (XYLOCAINE) 5 % ointment Apply 1 application topically as needed. Patient not taking: Reported on 04/30/2021 02/05/21   Robinson, [place] N, PA-C  Multiple Vitamin (MULTIVITAMIN WITH MINERALS) TABS tablet Take 1 tablet by mouth daily. Patient not taking: Reported on 04/30/2021 07/25/18   Eugenie Filler, MD    Current Facility-Administered Medications  Medication Dose Route Frequency Provider Last Rate Last Admin   acetaminophen (TYLENOL) tablet 650 mg  650 mg Oral Q6H PRN Marylyn Ishihara, Tyrone A, DO       Or   acetaminophen (TYLENOL) suppository 650 mg  650 mg Rectal Q6H PRN Marylyn Ishihara, Tyrone A, DO       dicyclomine (BENTYL) tablet 20 mg  20 mg Oral BID Marylyn Ishihara, Tyrone A, DO   20 mg at 05/01/21 0857   methylPREDNISolone sodium succinate (SOLU-MEDROL) 40 mg/mL injection 20 mg  20 mg Intravenous BID Marylyn Ishihara, Tyrone A, DO   20 mg at 05/01/21 0857   morphine 4 MG/ML injection 4 mg  4 mg Intravenous Q4H PRN Cherylann Ratel A, DO   4 mg at 04/30/21 9480   oxyCODONE (Oxy IR/ROXICODONE) immediate release tablet 5 mg  5 mg Oral Q4H PRN Marylyn Ishihara, Tyrone A, DO       pantoprazole (PROTONIX) injection 40 mg  40 mg Intravenous Q24H Kyle, Tyrone A, DO   40 mg at 04/30/21 2133   prochlorperazine (COMPAZINE)  injection 10 mg  10 mg Intravenous Q6H PRN Cherylann Ratel A, DO        Allergies as of 04/29/2021   (No Known Allergies)    Family History  Problem Relation Age of Onset   GI Disease Neg Hx    Autoimmune disease Neg Hx     Social History   Tobacco Use   Smoking status: Never   Smokeless tobacco: Never  Vaping Use   Vaping Use: Never used  Substance Use Topics   Alcohol use: Never   Drug use: Never    Review of Systems: All systems reviewed and negative except where noted in HPI.  Physical Exam: Vital signs in last 24 hours: Temp:  [97.3 F (36.3 C)-98.5 F (36.9 C)] 98.5 F (36.9 C) (06/12 1655) Pulse Rate:  [  44-78] 50 (06/12 5284) Resp:  [12-18] 18 (06/12 1324) BP: (109-135)/(58-97) 128/95 (06/12 0613) SpO2:  [98 %-100 %] 100 % (06/12 4010)   General:   Awake, alert, NAD Psych:  Pleasant, cooperative. Normal mood and affect. Eyes:  Pupils equal, sclera clear, no icterus.    Lungs:  Clear throughout to auscultation.   No wheezes, crackles, or rhonchi.  Heart:  Regular rate and rhythm; no murmurs, no lower extremity edema Abdomen:  Soft, tender particularly lower and right abdomen without rebound or guarding, non-distended, BS active, no palp mass   Rectal:  Deferred  Msk:  Symmetrical without gross deformities. . Neurologic:  Alert and  oriented x4;  grossly normal neurologically. Skin:  Intact without significant lesions or rashes.   Intake/Output from previous day: 06/11 0701 - 06/12 0700 In: 1022.5 [P.O.:960; I.V.:62.5] Out: 1001 [Urine:1000; Stool:1] Intake/Output this shift: Total I/O In: 480 [P.O.:480] Out: 0   Lab Results: Recent Labs    04/29/21 0032 05/01/21 0459  WBC 7.4 5.1  HGB 10.9* 10.3*  HCT 34.1* 32.5*  PLT 437* 420*   BMET Recent Labs    04/29/21 0032 05/01/21 0459  NA 138 138  K 3.9 4.6  CL 100 103  CO2 29 26  GLUCOSE 96 100*  BUN 12 12  CREATININE 0.81 0.77  CALCIUM 9.2 8.8*   LFT Recent Labs    05/01/21 0459   PROT 6.7  ALBUMIN 2.3*  AST 10*  ALT 8  ALKPHOS 59  BILITOT 0.2*    . CBC Latest Ref Rng & Units 05/01/2021 04/29/2021 04/26/2021  WBC 4.0 - 10.5 K/uL 5.1 7.4 3.9(L)  Hemoglobin 12.0 - 15.0 g/dL 10.3(L) 10.9(L) 12.0  Hematocrit 36.0 - 46.0 % 32.5(L) 34.1(L) 36.6  Platelets 150 - 400 K/uL 420(H) 437(H) 365    . CMP Latest Ref Rng & Units 05/01/2021 04/29/2021 04/26/2021  Glucose 70 - 99 mg/dL 100(H) 96 72  BUN 6 - 20 mg/dL 12 12 7   Creatinine 0.44 - 1.00 mg/dL 0.77 0.81 0.47  Sodium 135 - 145 mmol/L 138 138 134(L)  Potassium 3.5 - 5.1 mmol/L 4.6 3.9 4.6  Chloride 98 - 111 mmol/L 103 100 101  CO2 22 - 32 mmol/L 26 29 25   Calcium 8.9 - 10.3 mg/dL 8.8(L) 9.2 8.6(L)  Total Protein 6.5 - 8.1 g/dL 6.7 8.3(H) 8.0  Total Bilirubin 0.3 - 1.2 mg/dL 0.2(L) 0.2(L) 0.5  Alkaline Phos 38 - 126 U/L 59 65 74  AST 15 - 41 U/L 10(L) 11(L) 12(L)  ALT 0 - 44 U/L 8 8 9    Studies/Results: CT ABDOMEN PELVIS W CONTRAST  Result Date: 04/30/2021 CLINICAL DATA:  Abdominal and dull left chest pain, history of Crohn's EXAM: CT ABDOMEN AND PELVIS WITH CONTRAST TECHNIQUE: Multidetector CT imaging of the abdomen and pelvis was performed using the standard protocol following bolus administration of intravenous contrast. CONTRAST:  126m OMNIPAQUE IOHEXOL 300 MG/ML  SOLN COMPARISON:  CT 02/05/2021, 10/12/2020 FINDINGS: Lower chest: Lung bases are clear. Normal heart size. No pericardial effusion. Hepatobiliary: Heterogeneous hepatic enhancement numerous poorly enhancing subcapsular foci throughout the liver. While this may be accentuated by contrast timing, such appearance was not present on comparison priors and could reflect intrinsic liver processes or perfusion related changes. No other concerning focal liver abnormality. Liver surface contour remains smooth. Findings on a background of diffuse hepatic hypoattenuation. Normal gallbladder and biliary tree. Pancreas: No pancreatic ductal dilatation or surrounding  inflammatory changes. Spleen: Normal in size. No concerning splenic lesions.  Adrenals/Urinary Tract: Normal adrenal glands. Kidneys are normally located with symmetric enhancement. No suspicious renal lesion, urolithiasis or hydronephrosis. Urinary bladder is unremarkable for the degree of distention. Stomach/Bowel: Distal esophagus, stomach and duodenum are unremarkable. Proximal small bowel is free of acute abnormality. Suspect some mild thickening versus underdistention of the terminal ileum. Normal appendix seen in the right lower quadrant. No colonic dilatation or wall thickening. No evidence of bowel obstruction. Vascular/Lymphatic: No significant vascular findings are present. Reactive appearing low mesenteric lymph nodes. No pathologically enlarged abdominal or pelvic lymph nodes. Reproductive: Uterine displaced from towards the right pelvic sidewall, unchanged from comparison prior. No concerning adnexal masses. Other: No abdominopelvic free fluid or free gas. No bowel containing hernias. Musculoskeletal: No acute osseous abnormality or suspicious osseous lesion. IMPRESSION: 1. Heterogeneous hepatic enhancement with poorly enhancing subcapsular foci throughout the liver, etiology is unclear. Question intrinsic liver disease versus perfusion related changes or contrast timing. Findings on a background of more diffuse hypoattenuation which could be seen in the setting of hepatic steatosis as well. Consider further characterization with serologies and if abnormal, hepatic MRI could be obtained. 2. Mild mural thickening versus underdistention of the terminal ileum. Such appearance could reflect a terminal ileitis particularly in the setting of Crohn's disease. Some reactive low mesenteric adenopathy is present as well. No organized abscess or collection or other discernible complication. These results were called by telephone at the time of interpretation on 04/30/2021 at 2:09 am to provider [facility] , who  verbally acknowledged these results. Electronically Signed   By: Lovena Le M.D.   On: 04/30/2021 02:07    Active Problems:   Colin Ina M. Monica Zahler, M.D. @  05/01/2021, 10:22 AM

## 2021-05-01 NOTE — Progress Notes (Signed)
PROGRESS NOTE    Jennifer Cain  PNT:[id number] DOB: Mar 29, 1996 DOA: 04/29/2021 PCP: Pcp, No  Brief Narrative:Jennifer Cain is a 25 y.o. female with medical history significant of Crohns disease, has not followed up with gastroenterology since 2019, not on maintenance medication. -Presented to the ED with 2-week history of nausea vomiting and abdominal pain in the epigastrium and right lower quadrant, initially seen in the ED on 6/7 discharged home on prednisone Bentyl and Zofran.  Came back to the ER 6/10 and symptoms did not improve, CT concerning for ileitis   Assessment & Plan:   Crohn's ileitis/flare -Failed outpatient course of steroids antiemetics etc. -CT with terminal ileitis, otherwise unremarkable -Continue IV Solu-Medrol, advance diet -Appreciate gastroenterology input  Normocytic anemia -More consistent with chronic disease -Also check B12  DVT prophylaxis: Add Lovenox Code Status: Full code Family Communication: Discussed patient in detail, no family at bedside Disposition Plan:  Status is: Inpatient  Remains inpatient appropriate because:Inpatient level of care appropriate due to severity of illness  Dispo: The patient is from: Home              Anticipated d/c is to: Home              Patient currently is not medically stable to d/c.   Difficult to place patient No  Consultants:  Gastroenterology  Procedures:   Antimicrobials:    Subjective: Complains of mild right lower quadrant abdominal pain  Objective: Vitals:   04/30/21 1400 04/30/21 1500 04/30/21 2056 05/01/21 0613  BP: 112/82 (!) 117/95 109/75 (!) 128/95  Pulse:  67 70 (!) 50  Resp: 17 18 18 18   Temp: 98 F (36.7 C) (!) 97.3 F (36.3 C) 98.3 F (36.8 C) 98.5 F (36.9 C)  TempSrc: Oral Oral Oral Oral  SpO2: 98% 98% 100% 100%  Weight:      Height:        Intake/Output Summary (Last 24 hours) at 05/01/2021 1031 Last data filed at 05/01/2021 1000 Gross per 24 hour  Intake 1502.5 ml  Output  1001 ml  Net 501.5 ml   Filed Weights   04/29/21 2153  Weight: 64.4 kg    Examination:  General exam: appears calm and comfortable, AAOx3 Respiratory system: Clear to auscultation Cardiovascular system: S1 & S2 heard, RRR.  Abd: nondistended, soft and nontender.Normal bowel sounds heard. Central nervous system: Alert and oriented. No focal neurological deficits. Extremities: no edema Skin: No rashes Psychiatry: Flat affect   Data Reviewed:   CBC: Recent Labs  Lab 04/26/21 1458 04/29/21 0032 05/01/21 0459  WBC 3.9* 7.4 5.1  NEUTROABS  --  5.3  --   HGB 12.0 10.9* 10.3*  HCT 36.6 34.1* 32.5*  MCV 85.9 86.5 88.1  PLT 365 437* 086*   Basic Metabolic Panel: Recent Labs  Lab 04/26/21 1458 04/29/21 0032 05/01/21 0459  NA 134* 138 138  K 4.6 3.9 4.6  CL 101 100 103  CO2 25 29 26   GLUCOSE 72 96 100*  BUN 7 12 12   CREATININE 0.47 0.81 0.77  CALCIUM 8.6* 9.2 8.8*   GFR: Estimated Creatinine Clearance: 92.8 mL/min (by C-G formula based on SCr of 0.77 mg/dL). Liver Function Tests: Recent Labs  Lab 04/26/21 1458 04/29/21 0032 05/01/21 0459  AST 12* 11* 10*  ALT 9 8 8   ALKPHOS 74 65 59  BILITOT 0.5 0.2* 0.2*  PROT 8.0 8.3* 6.7  ALBUMIN 2.6* 2.8* 2.3*   Recent Labs  Lab 04/26/21 1458 04/29/21 0032  LIPASE 20 24   No results for input(s): AMMONIA in the last 168 hours. Coagulation Profile: No results for input(s): INR, PROTIME in the last 168 hours. Cardiac Enzymes: No results for input(s): CKTOTAL, CKMB, CKMBINDEX, TROPONINI in the last 168 hours. BNP (last 3 results) No results for input(s): PROBNP in the last 8760 hours. HbA1C: No results for input(s): HGBA1C in the last 72 hours. CBG: No results for input(s): GLUCAP in the last 168 hours. Lipid Profile: No results for input(s): CHOL, HDL, LDLCALC, TRIG, CHOLHDL, LDLDIRECT in the last 72 hours. Thyroid Function Tests: No results for input(s): TSH, T4TOTAL, FREET4, T3FREE, THYROIDAB in the last 72  hours. Anemia Panel: Recent Labs    05/01/21 0459  TIBC 168*  IRON 31   Urine analysis:    Component Value Date/Time   COLORURINE YELLOW 04/29/2021 2329   APPEARANCEUR HAZY (A) 04/29/2021 2329   LABSPEC 1.015 04/29/2021 2329   PHURINE 6.5 04/29/2021 2329   GLUCOSEU NEGATIVE 04/29/2021 2329   HGBUR NEGATIVE 04/29/2021 2329   BILIRUBINUR SMALL (A) 04/29/2021 2329   KETONESUR NEGATIVE 04/29/2021 2329   PROTEINUR NEGATIVE 04/29/2021 2329   NITRITE NEGATIVE 04/29/2021 2329   LEUKOCYTESUR NEGATIVE 04/29/2021 2329   Sepsis Labs: @LABRCNTIP (procalcitonin:4,lacticidven:4)  ) Recent Results (from the past 240 hour(s))  Resp Panel by RT-PCR (Flu A&B, Covid) Nasopharyngeal Swab     Status: None   Collection Time: 04/30/21  2:31 AM   Specimen: Nasopharyngeal Swab; Nasopharyngeal(NP) swabs in vial transport medium  Result Value Ref Range Status   SARS Coronavirus 2 by RT PCR NEGATIVE NEGATIVE Final    Comment: (NOTE) SARS-CoV-2 target nucleic acids are NOT DETECTED.  The SARS-CoV-2 RNA is generally detectable in upper respiratory specimens during the acute phase of infection. The lowest concentration of SARS-CoV-2 viral copies this assay can detect is 138 copies/mL. A negative result does not preclude SARS-Cov-2 infection and should not be used as the sole basis for treatment or other patient management decisions. A negative result may occur with  improper specimen collection/handling, submission of specimen other than nasopharyngeal swab, presence of viral mutation(s) within the areas targeted by this assay, and inadequate number of viral copies(<138 copies/mL). A negative result must be combined with clinical observations, patient history, and epidemiological information. The expected result is Negative.  Fact Sheet for Patients:  EntrepreneurPulse.com.au  Fact Sheet for Healthcare Providers:  IncredibleEmployment.be  This test is no t yet  approved or cleared by the [place] FDA and  has been authorized for detection and/or diagnosis of SARS-CoV-2 by FDA under an Emergency Use Authorization (EUA). This EUA will remain  in effect (meaning this test can be used) for the duration of the COVID-19 declaration under Section 564(b)(1) of the Act, 21 U.S.C.section 360bbb-3(b)(1), unless the authorization is terminated  or revoked sooner.       Influenza A by PCR NEGATIVE NEGATIVE Final   Influenza B by PCR NEGATIVE NEGATIVE Final    Comment: (NOTE) The Xpert Xpress SARS-CoV-2/FLU/RSV plus assay is intended as an aid in the diagnosis of influenza from Nasopharyngeal swab specimens and should not be used as a sole basis for treatment. Nasal washings and aspirates are unacceptable for Xpert Xpress SARS-CoV-2/FLU/RSV testing.  Fact Sheet for Patients: EntrepreneurPulse.com.au  Fact Sheet for Healthcare Providers: IncredibleEmployment.be  This test is not yet approved or cleared by the [place] FDA and has been authorized for detection and/or diagnosis of SARS-CoV-2 by FDA under an Emergency Use Authorization (EUA). This EUA will remain  in effect (meaning this test can be used) for the duration of the COVID-19 declaration under Section 564(b)(1) of the Act, 21 U.S.C. section 360bbb-3(b)(1), unless the authorization is terminated or revoked.  Performed at [facility], [address] [place], [place] [address]          Radiology Studies: CT ABDOMEN PELVIS W CONTRAST  Result Date: 04/30/2021 CLINICAL DATA:  Abdominal and dull left chest pain, history of Crohn's EXAM: CT ABDOMEN AND PELVIS WITH CONTRAST TECHNIQUE: Multidetector CT imaging of the abdomen and pelvis was performed using the standard protocol following bolus administration of intravenous contrast. CONTRAST:  184m OMNIPAQUE IOHEXOL 300 MG/ML  SOLN COMPARISON:  CT 02/05/2021, 10/12/2020 FINDINGS: Lower  chest: Lung bases are clear. Normal heart size. No pericardial effusion. Hepatobiliary: Heterogeneous hepatic enhancement numerous poorly enhancing subcapsular foci throughout the liver. While this may be accentuated by contrast timing, such appearance was not present on comparison priors and could reflect intrinsic liver processes or perfusion related changes. No other concerning focal liver abnormality. Liver surface contour remains smooth. Findings on a background of diffuse hepatic hypoattenuation. Normal gallbladder and biliary tree. Pancreas: No pancreatic ductal dilatation or surrounding inflammatory changes. Spleen: Normal in size. No concerning splenic lesions. Adrenals/Urinary Tract: Normal adrenal glands. Kidneys are normally located with symmetric enhancement. No suspicious renal lesion, urolithiasis or hydronephrosis. Urinary bladder is unremarkable for the degree of distention. Stomach/Bowel: Distal esophagus, stomach and duodenum are unremarkable. Proximal small bowel is free of acute abnormality. Suspect some mild thickening versus underdistention of the terminal ileum. Normal appendix seen in the right lower quadrant. No colonic dilatation or wall thickening. No evidence of bowel obstruction. Vascular/Lymphatic: No significant vascular findings are present. Reactive appearing low mesenteric lymph nodes. No pathologically enlarged abdominal or pelvic lymph nodes. Reproductive: Uterine displaced from towards the right pelvic sidewall, unchanged from comparison prior. No concerning adnexal masses. Other: No abdominopelvic free fluid or free gas. No bowel containing hernias. Musculoskeletal: No acute osseous abnormality or suspicious osseous lesion. IMPRESSION: 1. Heterogeneous hepatic enhancement with poorly enhancing subcapsular foci throughout the liver, etiology is unclear. Question intrinsic liver disease versus perfusion related changes or contrast timing. Findings on a background of more diffuse  hypoattenuation which could be seen in the setting of hepatic steatosis as well. Consider further characterization with serologies and if abnormal, hepatic MRI could be obtained. 2. Mild mural thickening versus underdistention of the terminal ileum. Such appearance could reflect a terminal ileitis particularly in the setting of Crohn's disease. Some reactive low mesenteric adenopathy is present as well. No organized abscess or collection or other discernible complication. These results were called by telephone at the time of interpretation on 04/30/2021 at 2:09 am [facility] who verbally acknowledged these results. Electronically Signed   By: PLovena LeM.D.   On: 04/30/2021 02:07     Scheduled Meds:  dicyclomine  20 mg Oral BID   methylPREDNISolone (SOLU-MEDROL) injection  20 mg Intravenous BID   pantoprazole (PROTONIX) IV  40 mg Intravenous Q24H   Continuous Infusions:   LOS: 1 day    Time spent: 366m  PrDomenic PoliteMD [facility]   05/01/2021, 10:31 AM

## 2021-05-02 LAB — CBC
HCT: 31.6 % — ABNORMAL LOW (ref 36.0–46.0)
Hemoglobin: 10 g/dL — ABNORMAL LOW (ref 12.0–15.0)
MCH: 27.9 pg (ref 26.0–34.0)
MCHC: 31.6 g/dL (ref 30.0–36.0)
MCV: 88.3 fL (ref 80.0–100.0)
Platelets: 406 10*3/uL — ABNORMAL HIGH (ref 150–400)
RBC: 3.58 MIL/uL — ABNORMAL LOW (ref 3.87–5.11)
RDW: 14.6 % (ref 11.5–15.5)
WBC: 7.2 10*3/uL (ref 4.0–10.5)
nRBC: 0 % (ref 0.0–0.2)

## 2021-05-02 LAB — BASIC METABOLIC PANEL
Anion gap: 5 (ref 5–15)
BUN: 12 mg/dL (ref 6–20)
CO2: 30 mmol/L (ref 22–32)
Calcium: 9 mg/dL (ref 8.9–10.3)
Chloride: 103 mmol/L (ref 98–111)
Creatinine, Ser: 0.82 mg/dL (ref 0.44–1.00)
GFR, Estimated: >60 mL/min (ref >60)
Glucose, Bld: 123 mg/dL — ABNORMAL HIGH (ref 70–99)
Potassium: 4.5 mmol/L (ref 3.5–5.1)
Sodium: 138 mmol/L (ref 135–145)

## 2021-05-02 LAB — VITAMIN B12: Vitamin B-12: 525 pg/mL (ref 180–914)

## 2021-05-02 LAB — OCCULT BLOOD X 1 CARD TO LAB, STOOL: Fecal Occult Bld: POSITIVE — AB

## 2021-05-02 NOTE — Progress Notes (Signed)
     [facility] Progress Note  CC: Crohn's ileitis/flare  Subjective:  She has generalized abdominal pain each time she eats. She vomited x 1 yesterday without recurrence. She is currently waiting  for her breakfast tray. She reported passing 2 loose black stools yesterday and a small semis solid and black stool this morning. No heartburn or dysphagia. No recent NSAID use. No alcohol or drug use.    Objective:  Vital signs in last 24 hours: Temp:  [97.8 F (36.6 C)-98.2 F (36.8 C)] 98.2 F (36.8 C) (06/13 0439) Pulse Rate:  [56-89] 89 (06/13 0439) Resp:  [16-18] 16 (06/13 0439) BP: (118-127)/(69-86) 118/83 (06/13 0439) SpO2:  [99 %-100 %] 99 % (06/13 0439) Last BM Date: 05/02/21 General:   Alert 25 year old female in NAD.  Heart: RRR, no murmur.  Pulm:  Breath sounds clear throughout.  Abdomen: Soft, nondistended. Mild LUQ, epigastric and lower abdominal tenderness without rebound or guarding.  Rectal: Patient declined exam.  Extremities:  Without edema. Neurologic:  Alert and  oriented x4. Grossly normal neurologically. Psych:  Alert and cooperative. Normal mood and affect.  Intake/Output from previous day: 06/12 0701 - 06/13 0700 In: 1260 [P.O.:1260] Out: 0  Intake/Output this shift: No intake/output data recorded.  Lab Results: Recent Labs    05/01/21 0459 05/02/21 0450  WBC 5.1 7.2  HGB 10.3* 10.0*  HCT 32.5* 31.6*  PLT 420* 406*   BMET Recent Labs    05/01/21 0459 05/02/21 0450  NA 138 138  K 4.6 4.5  CL 103 103  CO2 26 30  GLUCOSE 100* 123*  BUN 12 12  CREATININE 0.77 0.82  CALCIUM 8.8* 9.0   LFT Recent Labs    05/01/21 0459  PROT 6.7  ALBUMIN 2.3*  AST 10*  ALT 8  ALKPHOS 59  BILITOT 0.2*   PT/INR No results for input(s): LABPROT, INR in the last 72 hours. Hepatitis Panel No results for input(s): HEPBSAG, HCVAB, HEPAIGM, HEPBIGM in the last 72 hours.  No results found.  Assessment / Plan: 1. 25 y.o. female with a  past medical history of small bowel Crohn's disease (question of colonic involvement by biopsies in 2019 though acute inflammation only in the colon at that time) admitted to the hospital 05/01/2021 with ongoing issues with abdominal pain, nausea, vomiting. CTAP w/contrast 6/11 showed mild mural thickening versus underdistention of the TI with mesenteric adenopathy.  No evidence of a bowel obstruction. Heterogeneous hepatic enhancement with poorly enhancing subscapular foci throughout the liver was noted, etiology is unclear. Fecal Calprotectin level pending. Normal LFTs. -Soft diet as tolerated -Ondansetron 67m po or IV Q 6 hrs PRN, recommend dose prior to meals  -Continue Dicyclomine 21mpo bid -Continue Methylprednisolone 20 mg IV twice daily -? Trial with Pentasa  -Pain management per the hospitalist -Eventual EGD/colonoscopy, inpatient vs outpatient.  -Eventual small bowel MRI vs CT enterography  -Await further recommendations from Dr. BeTarri Glenn 2. Normocytic anemia. Hg 10.3. MCV 88.1. Iron 31. TIBC 168. Ferritin 46. B12 525. She reported passing a loose black stool x 2 yesterday and a semi solid/loose black stool this morning. No bright red rectal bleeding. -FOBT with next BM (patient declined rectal exam to assess for melena) -Continue Pantoprazole 4067mV Q 24 hrs    Active Problems:   Ileitis     LOS: 2 days   ColNoralyn Pick/13/2022, 9:37 AM

## 2021-05-02 NOTE — Progress Notes (Signed)
PROGRESS NOTE    Jennifer Cain  LMB:[id number] DOB: 02/09/1996 DOA: 04/29/2021 PCP: Pcp, No  Brief Narrative:Jennifer Cain is a 25 y.o. female with medical history significant of Crohns disease, has not followed up with gastroenterology since 2019, not on maintenance medication. -Presented to the ED with 2-week history of nausea vomiting and abdominal pain in the epigastrium and right lower quadrant, initially seen in the ED on 6/7 discharged home on prednisone Bentyl and Zofran.  Came back to the ER 6/10 and symptoms did not improve, CT concerning for ileitis   Assessment & Plan:   Crohn's ileitis/flare -Failed outpatient course of steroids antiemetics etc. -CT with terminal ileitis, otherwise unremarkable -Remains symptomatic -Continue IV Solu-Medrol and dicyclomine -Appreciate gastroenterology input  Normocytic anemia -More consistent with chronic disease -Follow-up B12  DVT prophylaxis:  Lovenox Code Status: Full code Family Communication: Discussed patient in detail, no family at bedside Disposition Plan:  Status is: Inpatient  Remains inpatient appropriate because:Inpatient level of care appropriate due to severity of illness  Dispo: The patient is from: Home              Anticipated d/c is to: Home              Patient currently is not medically stable to d/c.   Difficult to place patient No  Consultants:  Gastroenterology  Procedures:   Antimicrobials:    Subjective: -Continues to have right-sided abdominal pain, had some bloody stools overnight, vomiting x1 yesterday, complains of nausea  Objective: Vitals:   05/01/21 0613 05/01/21 1329 05/01/21 2059 05/02/21 0439  BP: (!) 128/95 127/86 119/69 118/83  Pulse: (!) 50 72 (!) 56 89  Resp: 18 18 16 16   Temp: 98.5 F (36.9 C) 97.8 F (36.6 C) 98.2 F (36.8 C) 98.2 F (36.8 C)  TempSrc: Oral Oral Oral Oral  SpO2: 100% 100% 100% 99%  Weight:      Height:        Intake/Output Summary (Last 24 hours) at  05/02/2021 1100 Last data filed at 05/02/2021 1000 Gross per 24 hour  Intake 1140 ml  Output 0 ml  Net 1140 ml   Filed Weights   04/29/21 2153  Weight: 64.4 kg    Examination:  General exam: Gen: Awake, Alert, Oriented X 3, no distress HEENT: no JVD Lungs: Good air movement bilaterally, CTAB CVS: S1S2/RRR Abd: soft, Non tender, non distended, BS present Extremities: No edema Skin: no new rashes on exposed skin   Data Reviewed:   CBC: Recent Labs  Lab 04/26/21 1458 04/29/21 0032 05/01/21 0459 05/02/21 0450  WBC 3.9* 7.4 5.1 7.2  NEUTROABS  --  5.3  --   --   HGB 12.0 10.9* 10.3* 10.0*  HCT 36.6 34.1* 32.5* 31.6*  MCV 85.9 86.5 88.1 88.3  PLT 365 437* 420* 100*   Basic Metabolic Panel: Recent Labs  Lab 04/26/21 1458 04/29/21 0032 05/01/21 0459 05/02/21 0450  NA 134* 138 138 138  K 4.6 3.9 4.6 4.5  CL 101 100 103 103  CO2 25 29 26 30   GLUCOSE 72 96 100* 123*  BUN 7 12 12 12   CREATININE 0.47 0.81 0.77 0.82  CALCIUM 8.6* 9.2 8.8* 9.0   GFR: Estimated Creatinine Clearance: 90.6 mL/min (by C-G formula based on SCr of 0.82 mg/dL). Liver Function Tests: Recent Labs  Lab 04/26/21 1458 04/29/21 0032 05/01/21 0459  AST 12* 11* 10*  ALT 9 8 8   ALKPHOS 74 65 59  BILITOT 0.5 0.2* 0.2*  PROT 8.0 8.3* 6.7  ALBUMIN 2.6* 2.8* 2.3*   Recent Labs  Lab 04/26/21 1458 04/29/21 0032  LIPASE 20 24   No results for input(s): AMMONIA in the last 168 hours. Coagulation Profile: No results for input(s): INR, PROTIME in the last 168 hours. Cardiac Enzymes: No results for input(s): CKTOTAL, CKMB, CKMBINDEX, TROPONINI in the last 168 hours. BNP (last 3 results) No results for input(s): PROBNP in the last 8760 hours. HbA1C: No results for input(s): HGBA1C in the last 72 hours. CBG: No results for input(s): GLUCAP in the last 168 hours. Lipid Profile: No results for input(s): CHOL, HDL, LDLCALC, TRIG, CHOLHDL, LDLDIRECT in the last 72 hours. Thyroid Function  Tests: No results for input(s): TSH, T4TOTAL, FREET4, T3FREE, THYROIDAB in the last 72 hours. Anemia Panel: Recent Labs    05/01/21 0459 05/02/21 0817  VITAMINB12  --  525  FERRITIN 46  --   TIBC 168*  --   IRON 31  --    Urine analysis:    Component Value Date/Time   COLORURINE YELLOW 04/29/2021 2329   APPEARANCEUR HAZY (A) 04/29/2021 2329   LABSPEC 1.015 04/29/2021 2329   PHURINE 6.5 04/29/2021 2329   GLUCOSEU NEGATIVE 04/29/2021 2329   HGBUR NEGATIVE 04/29/2021 2329   BILIRUBINUR SMALL (A) 04/29/2021 2329   KETONESUR NEGATIVE 04/29/2021 2329   PROTEINUR NEGATIVE 04/29/2021 2329   NITRITE NEGATIVE 04/29/2021 2329   LEUKOCYTESUR NEGATIVE 04/29/2021 2329   Sepsis Labs: @LABRCNTIP (procalcitonin:4,lacticidven:4)  ) Recent Results (from the past 240 hour(s))  Resp Panel by RT-PCR (Flu A&B, Covid) Nasopharyngeal Swab     Status: None   Collection Time: 04/30/21  2:31 AM   Specimen: Nasopharyngeal Swab; Nasopharyngeal(NP) swabs in vial transport medium  Result Value Ref Range Status   SARS Coronavirus 2 by RT PCR NEGATIVE NEGATIVE Final    Comment: (NOTE) SARS-CoV-2 target nucleic acids are NOT DETECTED.  The SARS-CoV-2 RNA is generally detectable in upper respiratory specimens during the acute phase of infection. The lowest concentration of SARS-CoV-2 viral copies this assay can detect is 138 copies/mL. A negative result does not preclude SARS-Cov-2 infection and should not be used as the sole basis for treatment or other patient management decisions. A negative result may occur with  improper specimen collection/handling, submission of specimen other than nasopharyngeal swab, presence of viral mutation(s) within the areas targeted by this assay, and inadequate number of viral copies(<138 copies/mL). A negative result must be combined with clinical observations, patient history, and epidemiological information. The expected result is Negative.  Fact Sheet for  Patients:  EntrepreneurPulse.com.au  Fact Sheet for Healthcare Providers:  IncredibleEmployment.be  This test is no t yet approved or cleared by the [place] FDA and  has been authorized for detection and/or diagnosis of SARS-CoV-2 by FDA under an Emergency Use Authorization (EUA). This EUA will remain  in effect (meaning this test can be used) for the duration of the COVID-19 declaration under Section 564(b)(1) of the Act, 21 U.S.C.section 360bbb-3(b)(1), unless the authorization is terminated  or revoked sooner.       Influenza A by PCR NEGATIVE NEGATIVE Final   Influenza B by PCR NEGATIVE NEGATIVE Final    Comment: (NOTE) The Xpert Xpress SARS-CoV-2/FLU/RSV plus assay is intended as an aid in the diagnosis of influenza from Nasopharyngeal swab specimens and should not be used as a sole basis for treatment. Nasal washings and aspirates are unacceptable for Xpert Xpress SARS-CoV-2/FLU/RSV testing.  Fact Sheet for Patients: EntrepreneurPulse.com.au  Fact Sheet  for Healthcare Providers: IncredibleEmployment.be  This test is not yet approved or cleared by the [place] and has been authorized for detection and/or diagnosis of SARS-CoV-2 by FDA under an Emergency Use Authorization (EUA). This EUA will remain in effect (meaning this test can be used) for the duration of the COVID-19 declaration under Section 564(b)(1) of the Act, 21 U.S.C. section 360bbb-3(b)(1), unless the authorization is terminated or revoked.  Performed at [facility], [address] [place], [place] [address]      Scheduled Meds:  dicyclomine  20 mg Oral BID   enoxaparin (LOVENOX) injection  40 mg Subcutaneous Q24H   methylPREDNISolone (SOLU-MEDROL) injection  20 mg Intravenous BID   pantoprazole (PROTONIX) IV  40 mg Intravenous Q24H   Continuous Infusions:   LOS: 2 days    Time spent:  43mn  PDomenic Polite MD [facility]   05/02/2021, 11:00 AM

## 2021-05-03 ENCOUNTER — Ambulatory Visit: Payer: Self-pay | Admitting: Family Medicine

## 2021-05-03 DIAGNOSIS — D500 Iron deficiency anemia secondary to blood loss (chronic): Secondary | ICD-10-CM

## 2021-05-03 LAB — BASIC METABOLIC PANEL
Anion gap: 8 (ref 5–15)
BUN: 11 mg/dL (ref 6–20)
CO2: 28 mmol/L (ref 22–32)
Calcium: 9.2 mg/dL (ref 8.9–10.3)
Chloride: 100 mmol/L (ref 98–111)
Creatinine, Ser: 0.85 mg/dL (ref 0.44–1.00)
GFR, Estimated: >60 mL/min (ref >60)
Glucose, Bld: 113 mg/dL — ABNORMAL HIGH (ref 70–99)
Potassium: 4.4 mmol/L (ref 3.5–5.1)
Sodium: 136 mmol/L (ref 135–145)

## 2021-05-03 LAB — HEMOGLOBIN AND HEMATOCRIT, BLOOD
HCT: 33.8 % — ABNORMAL LOW (ref 36.0–46.0)
Hemoglobin: 10.6 g/dL — ABNORMAL LOW (ref 12.0–15.0)

## 2021-05-03 LAB — CALPROTECTIN, FECAL: Calprotectin, Fecal: 336 ug/g — ABNORMAL HIGH (ref 0–120)

## 2021-05-03 MED ORDER — PEG-KCL-NACL-NASULF-NA ASC-C 100 G PO SOLR
0.5000 | Freq: Once | ORAL | Status: AC
  Administered 2021-05-03: 100 g via ORAL

## 2021-05-03 MED ORDER — PEG-KCL-NACL-NASULF-NA ASC-C 100 G PO SOLR
0.5000 | Freq: Once | ORAL | Status: AC
  Administered 2021-05-03: 100 g via ORAL
  Filled 2021-05-03: qty 1

## 2021-05-03 MED ORDER — PEG-KCL-NACL-NASULF-NA ASC-C 100 G PO SOLR: 1.0000 | Freq: Once | ORAL | Status: DC

## 2021-05-03 MED ORDER — MESALAMINE ER 250 MG PO CPCR
1000.0000 mg | ORAL_CAPSULE | Freq: Four times a day (QID) | ORAL | Status: DC
  Administered 2021-05-03 – 2021-05-05 (×9): 1000 mg via ORAL
  Filled 2021-05-03 (×11): qty 4

## 2021-05-03 MED ORDER — SODIUM CHLORIDE 0.9 % IV SOLN: INTRAVENOUS | Status: DC

## 2021-05-03 NOTE — Progress Notes (Addendum)
PROGRESS NOTE    Jennifer Cain  EOF:[id number] DOB: Feb 14, 1996 DOA: 04/29/2021 PCP: Pcp, No  Brief Narrative:Jennifer Cain is a 25 y.o. female with medical history significant of Crohns disease, has not followed up with gastroenterology since 2019, not on maintenance medication. -Presented to the ED with 2-week history of nausea vomiting and abdominal pain in the epigastrium and right lower quadrant, initially seen in the ED on 6/7 discharged home on prednisone Bentyl and Zofran.  Came back to the ER 6/10 and symptoms did not improve, CT concerning for ileitis   Assessment & Plan:   Crohn's ileitis/flare -Failed outpatient course of steroids antiemetics etc. -CT with terminal ileitis, otherwise unremarkable -Intermittent dark stools -Continue IV Solu-Medrol and dicyclomine -Pentasa added -gastroenterology following, plan for EGD and colonoscopy tomorrow -Plan for small bowel MRI as outpatient  Normocytic anemia  DVT prophylaxis:  Lovenox Code Status: Full code Family Communication: Discussed patient in detail, no family at bedside Disposition Plan:  Status is: Inpatient  Remains inpatient appropriate because:Inpatient level of care appropriate due to severity of illness  Dispo: The patient is from: Home              Anticipated d/c is to: Home              Patient currently is not medically stable to d/c.   Difficult to place patient No  Consultants:  Gastroenterology  Procedures:   Antimicrobials:    Subjective: -Some dark stools overnight, mild right-sided discomfort, family brought her some chicken wings yesterday Objective: Vitals:   05/02/21 0439 05/02/21 1402 05/02/21 2048 05/03/21 0430  BP: 118/83 (!) 144/95 (!) 140/92 130/84  Pulse: 89 60 79 95  Resp: 16 14 17 17   Temp: 98.2 F (36.8 C) 98.2 F (36.8 C) 98.2 F (36.8 C) 98.5 F (36.9 C)  TempSrc: Oral Oral Oral Oral  SpO2: 99% 100% 100% 100%  Weight:      Height:        Intake/Output Summary (Last 24  hours) at 05/03/2021 1058 Last data filed at 05/03/2021 0800 Gross per 24 hour  Intake 1040 ml  Output --  Net 1040 ml   Filed Weights   04/29/21 2153  Weight: 64.4 kg    Examination:  General exam: Gen: Awake, Alert, Oriented X 3,  HEENT: no JVD Lungs: Good air movement bilaterally, CTAB CVS: S1S2/RRR Abd: soft, mild left-sided tenderness, nondistended, bowel sounds present  Extremities: No edema Skin: no new rashes on exposed skin   Data Reviewed:   CBC: Recent Labs  Lab 04/26/21 1458 04/29/21 0032 05/01/21 0459 05/02/21 0450 05/03/21 0628  WBC 3.9* 7.4 5.1 7.2  --   NEUTROABS  --  5.3  --   --   --   HGB 12.0 10.9* 10.3* 10.0* 10.6*  HCT 36.6 34.1* 32.5* 31.6* 33.8*  MCV 85.9 86.5 88.1 88.3  --   PLT 365 437* 420* 406*  --    Basic Metabolic Panel: Recent Labs  Lab 04/26/21 1458 04/29/21 0032 05/01/21 0459 05/02/21 0450 05/03/21 0628  NA 134* 138 138 138 136  K 4.6 3.9 4.6 4.5 4.4  CL 101 100 103 103 100  CO2 25 29 26 30 28   GLUCOSE 72 96 100* 123* 113*  BUN 7 12 12 12 11   CREATININE 0.47 0.81 0.77 0.82 0.85  CALCIUM 8.6* 9.2 8.8* 9.0 9.2   GFR: Estimated Creatinine Clearance: 87.4 mL/min (by C-G formula based on SCr of 0.85 mg/dL). Liver Function Tests: Recent  Labs  Lab 04/26/21 1458 04/29/21 0032 05/01/21 0459  AST 12* 11* 10*  ALT 9 8 8   ALKPHOS 74 65 59  BILITOT 0.5 0.2* 0.2*  PROT 8.0 8.3* 6.7  ALBUMIN 2.6* 2.8* 2.3*   Recent Labs  Lab 04/26/21 1458 04/29/21 0032  LIPASE 20 24   No results for input(s): AMMONIA in the last 168 hours. Coagulation Profile: No results for input(s): INR, PROTIME in the last 168 hours. Cardiac Enzymes: No results for input(s): CKTOTAL, CKMB, CKMBINDEX, TROPONINI in the last 168 hours. BNP (last 3 results) No results for input(s): PROBNP in the last 8760 hours. HbA1C: No results for input(s): HGBA1C in the last 72 hours. CBG: No results for input(s): GLUCAP in the last 168 hours. Lipid  Profile: No results for input(s): CHOL, HDL, LDLCALC, TRIG, CHOLHDL, LDLDIRECT in the last 72 hours. Thyroid Function Tests: No results for input(s): TSH, T4TOTAL, FREET4, T3FREE, THYROIDAB in the last 72 hours. Anemia Panel: Recent Labs    05/01/21 0459 05/02/21 0817  VITAMINB12  --  525  FERRITIN 46  --   TIBC 168*  --   IRON 31  --    Urine analysis:    Component Value Date/Time   COLORURINE YELLOW 04/29/2021 2329   APPEARANCEUR HAZY (A) 04/29/2021 2329   LABSPEC 1.015 04/29/2021 2329   PHURINE 6.5 04/29/2021 2329   GLUCOSEU NEGATIVE 04/29/2021 2329   HGBUR NEGATIVE 04/29/2021 2329   BILIRUBINUR SMALL (A) 04/29/2021 2329   KETONESUR NEGATIVE 04/29/2021 2329   PROTEINUR NEGATIVE 04/29/2021 2329   NITRITE NEGATIVE 04/29/2021 2329   LEUKOCYTESUR NEGATIVE 04/29/2021 2329   Sepsis Labs: @LABRCNTIP (procalcitonin:4,lacticidven:4)  ) Recent Results (from the past 240 hour(s))  Resp Panel by RT-PCR (Flu A&B, Covid) Nasopharyngeal Swab     Status: None   Collection Time: 04/30/21  2:31 AM   Specimen: Nasopharyngeal Swab; Nasopharyngeal(NP) swabs in vial transport medium  Result Value Ref Range Status   SARS Coronavirus 2 by RT PCR NEGATIVE NEGATIVE Final    Comment: (NOTE) SARS-CoV-2 target nucleic acids are NOT DETECTED.  The SARS-CoV-2 RNA is generally detectable in upper respiratory specimens during the acute phase of infection. The lowest concentration of SARS-CoV-2 viral copies this assay can detect is 138 copies/mL. A negative result does not preclude SARS-Cov-2 infection and should not be used as the sole basis for treatment or other patient management decisions. A negative result may occur with  improper specimen collection/handling, submission of specimen other than nasopharyngeal swab, presence of viral mutation(s) within the areas targeted by this assay, and inadequate number of viral copies(<138 copies/mL). A negative result must be combined with clinical  observations, patient history, and epidemiological information. The expected result is Negative.  Fact Sheet for Patients:  EntrepreneurPulse.com.au  Fact Sheet for Healthcare Providers:  IncredibleEmployment.be  This test is no t yet approved or cleared by the [place] FDA and  has been authorized for detection and/or diagnosis of SARS-CoV-2 by FDA under an Emergency Use Authorization (EUA). This EUA will remain  in effect (meaning this test can be used) for the duration of the COVID-19 declaration under Section 564(b)(1) of the Act, 21 U.S.C.section 360bbb-3(b)(1), unless the authorization is terminated  or revoked sooner.       Influenza A by PCR NEGATIVE NEGATIVE Final   Influenza B by PCR NEGATIVE NEGATIVE Final    Comment: (NOTE) The Xpert Xpress SARS-CoV-2/FLU/RSV plus assay is intended as an aid in the diagnosis of influenza from Nasopharyngeal swab specimens and  should not be used as a sole basis for treatment. Nasal washings and aspirates are unacceptable for Xpert Xpress SARS-CoV-2/FLU/RSV testing.  Fact Sheet for Patients: EntrepreneurPulse.com.au  Fact Sheet for Healthcare Providers: IncredibleEmployment.be  This test is not yet approved or cleared by the [place] FDA and has been authorized for detection and/or diagnosis of SARS-CoV-2 by FDA under an Emergency Use Authorization (EUA). This EUA will remain in effect (meaning this test can be used) for the duration of the COVID-19 declaration under Section 564(b)(1) of the Act, 21 U.S.C. section 360bbb-3(b)(1), unless the authorization is terminated or revoked.  Performed at [facility], [address] [place], [place] [address]      Scheduled Meds:  dicyclomine  20 mg Oral BID   enoxaparin (LOVENOX) injection  40 mg Subcutaneous Q24H   mesalamine  1,000 mg Oral QID   methylPREDNISolone (SOLU-MEDROL) injection   20 mg Intravenous BID   pantoprazole (PROTONIX) IV  40 mg Intravenous Q24H   peg 3350 powder  0.5 kit Oral Once   And   peg 3350 powder  0.5 kit Oral Once   Continuous Infusions:  sodium chloride 20 mL/hr at 05/03/21 1019     LOS: 3 days    Time spent: 23mn  PDomenic Polite MD [facility]   05/03/2021, 10:58 AM

## 2021-05-03 NOTE — Progress Notes (Signed)
     [facility] Progress Note  CC:  Crohn's ileitis/flare  Subjective: She ate chicken wings and a hamburger for lunch yesterday without vomiting. She continues to have epigastric and lower abdominal pain, rates a 4 out of 10. She passed one semi solid with loose stool yesterday hemoccult positive.   Objective:  Vital signs in last 24 hours: Temp:  [98.2 F (36.8 C)-98.5 F (36.9 C)] 98.5 F (36.9 C) (06/14 0430) Pulse Rate:  [60-95] 95 (06/14 0430) Resp:  [14-17] 17 (06/14 0430) BP: (130-144)/(84-95) 130/84 (06/14 0430) SpO2:  [100 %] 100 % (06/14 0430) Last BM Date: 05/02/21  General:  Alert in NAD.  Heart: RRR, no murmur.  Pulm: Breath sounds clear throughout.  Abdomen: Soft, nondistended. Mild epigastric and lower abdominal tenderness without rebound or guarding.  Extremities:  Without edema. Neurologic:  Alert and  oriented x 4. Grossly normal neurologically. Psych:  Alert and cooperative. Normal mood and affect.  Intake/Output from previous day: 06/13 0701 - 06/14 0700 In: 1280 [P.O.:1280] Out: -  Intake/Output this shift: No intake/output data recorded.  Lab Results: Recent Labs    05/01/21 0459 05/02/21 0450 05/03/21 0628  WBC 5.1 7.2  --   HGB 10.3* 10.0* 10.6*  HCT 32.5* 31.6* 33.8*  PLT 420* 406*  --    BMET Recent Labs    05/01/21 0459 05/02/21 0450 05/03/21 0628  NA 138 138 136  K 4.6 4.5 4.4  CL 103 103 100  CO2 26 30 28   GLUCOSE 100* 123* 113*  BUN 12 12 11   CREATININE 0.77 0.82 0.85  CALCIUM 8.8* 9.0 9.2   LFT Recent Labs    05/01/21 0459  PROT 6.7  ALBUMIN 2.3*  AST 10*  ALT 8  ALKPHOS 59  BILITOT 0.2*   PT/INR No results for input(s): LABPROT, INR in the last 72 hours. Hepatitis Panel No results for input(s): HEPBSAG, HCVAB, HEPAIGM, HEPBIGM in the last 72 hours.  No results found.  Assessment / Plan:  1. 25 y.o. female with a past medical history of small bowel Crohn's disease (question of colonic  involvement by biopsies in 2019 though acute inflammation only in the colon at that time) admitted to the hospital 05/01/2021 with ongoing issues with abdominal pain, nausea, vomiting. CTAP w/contrast 6/11 showed mild mural thickening versus underdistention of the TI with mesenteric adenopathy.  No evidence of a bowel obstruction. Heterogeneous hepatic enhancement with poorly enhancing subscapular foci throughout the liver was noted, etiology is unclear. Fecal Calprotectin level pending. Normal LFTs. She continues to have epigastric and lower abdominal pain. No further vomiting.  -Clear liquid diet today then NPO  after midnight  -EGD and colonoscopy with Dr. Tarri Glenn 05/04/2021, benefits and risks discussed including risk with sedation, risk of bleeding, perforation and infection  -Pentasa 108m po qid -Continue Dicyclomine 279mpo bid -Continue Methylprednisolone 20 mg IV twice daily -Ondansetron 48m69mo or IV Q 6hrs PRN -Pain management per the hospitalist -Eventual small bowel MRI   2. Normocytic anemia. Stable Hg 10.3 -> 10.6. Iron 31. TIBC 168. Ferritin 46. B12 525. She passed a few black stools since admission. No BM yet today. FOBT +. No menorrhagia.  -Continue Pantoprazole 6m32m bid -EGD and colonoscopy 05/04/2021 -CBC and BMP in am   Active Problems:   Ileitis     LOS: 3 days   CollNoralyn Pick14/2022, 8:50Am

## 2021-05-03 NOTE — Progress Notes (Deleted)
  Jennifer Cain - 25 y.o. female MRN [id number]  Date of birth: Mar 03, 1996  SUBJECTIVE:  Including CC & ROS.  No chief complaint on file.   Jennifer Cain is a 25 y.o. female that is  ***.    Review of Systems See HPI   HISTORY: Past Medical, Surgical, Social, and Family History Reviewed & Updated per EMR.   Pertinent Historical Findings include:  Past Medical History:  Diagnosis Date   Crohn disease ([place])     Past Surgical History:  Procedure Laterality Date   BIOPSY  07/20/2018   Procedure: BIOPSY;  Surgeon: Carol Ada, MD;  Location: [facility];  Service: Endoscopy;;   COLONOSCOPY WITH PROPOFOL N/A 07/20/2018   Procedure: COLONOSCOPY WITH PROPOFOL;  Surgeon: Carol Ada, MD;  Location: [facility];  Service: Endoscopy;  Laterality: N/A;   ESOPHAGOGASTRODUODENOSCOPY (EGD) WITH PROPOFOL N/A 07/20/2018   Procedure: ESOPHAGOGASTRODUODENOSCOPY (EGD) WITH PROPOFOL;  Surgeon: Carol Ada, MD;  Location: [facility];  Service: Endoscopy;  Laterality: N/A;    Family History  Problem Relation Age of Onset   GI Disease Neg Hx    Autoimmune disease Neg Hx     Social History   Socioeconomic History   Marital status: Single    Spouse name: Not on file   Number of children: Not on file   Years of education: Not on file   Highest education level: Not on file  Occupational History   Not on file  Tobacco Use   Smoking status: Never   Smokeless tobacco: Never  Vaping Use   Vaping Use: Never used  Substance and Sexual Activity   Alcohol use: Never   Drug use: Never   Sexual activity: Not on file  Other Topics Concern   Not on file  Social History Narrative   Not on file   Social Determinants of Health   Financial Resource Strain: Not on file  Food Insecurity: Not on file  Transportation Needs: Not on file  Physical Activity: Not on file  Stress: Not on file  Social Connections: Not on file  Intimate Partner Violence: Not on file     PHYSICAL EXAM:  VS: LMP  04/19/2021  Physical Exam Gen: NAD, alert, cooperative with exam, well-appearing MSK:      ASSESSMENT & PLAN:   No problem-specific Assessment & Plan notes found for this encounter.

## 2021-05-03 NOTE — H&P (View-Only) (Signed)
     [facility] Progress Note  CC:  Crohn's ileitis/flare  Subjective: She ate chicken wings and a hamburger for lunch yesterday without vomiting. She continues to have epigastric and lower abdominal pain, rates a 4 out of 10. She passed one semi solid with loose stool yesterday hemoccult positive.   Objective:  Vital signs in last 24 hours: Temp:  [98.2 F (36.8 C)-98.5 F (36.9 C)] 98.5 F (36.9 C) (06/14 0430) Pulse Rate:  [60-95] 95 (06/14 0430) Resp:  [14-17] 17 (06/14 0430) BP: (130-144)/(84-95) 130/84 (06/14 0430) SpO2:  [100 %] 100 % (06/14 0430) Last BM Date: 05/02/21  General:  Alert in NAD.  Heart: RRR, no murmur.  Pulm: Breath sounds clear throughout.  Abdomen: Soft, nondistended. Mild epigastric and lower abdominal tenderness without rebound or guarding.  Extremities:  Without edema. Neurologic:  Alert and  oriented x 4. Grossly normal neurologically. Psych:  Alert and cooperative. Normal mood and affect.  Intake/Output from previous day: 06/13 0701 - 06/14 0700 In: 1280 [P.O.:1280] Out: -  Intake/Output this shift: No intake/output data recorded.  Lab Results: Recent Labs    05/01/21 0459 05/02/21 0450 05/03/21 0628  WBC 5.1 7.2  --   HGB 10.3* 10.0* 10.6*  HCT 32.5* 31.6* 33.8*  PLT 420* 406*  --    BMET Recent Labs    05/01/21 0459 05/02/21 0450 05/03/21 0628  NA 138 138 136  K 4.6 4.5 4.4  CL 103 103 100  CO2 26 30 28   GLUCOSE 100* 123* 113*  BUN 12 12 11   CREATININE 0.77 0.82 0.85  CALCIUM 8.8* 9.0 9.2   LFT Recent Labs    05/01/21 0459  PROT 6.7  ALBUMIN 2.3*  AST 10*  ALT 8  ALKPHOS 59  BILITOT 0.2*   PT/INR No results for input(s): LABPROT, INR in the last 72 hours. Hepatitis Panel No results for input(s): HEPBSAG, HCVAB, HEPAIGM, HEPBIGM in the last 72 hours.  No results found.  Assessment / Plan:  1. 25 y.o. female with a past medical history of small bowel Crohn's disease (question of colonic  involvement by biopsies in 2019 though acute inflammation only in the colon at that time) admitted to the hospital 05/01/2021 with ongoing issues with abdominal pain, nausea, vomiting. CTAP w/contrast 6/11 showed mild mural thickening versus underdistention of the TI with mesenteric adenopathy.  No evidence of a bowel obstruction. Heterogeneous hepatic enhancement with poorly enhancing subscapular foci throughout the liver was noted, etiology is unclear. Fecal Calprotectin level pending. Normal LFTs. She continues to have epigastric and lower abdominal pain. No further vomiting.  -Clear liquid diet today then NPO  after midnight  -EGD and colonoscopy with Dr. Tarri Glenn 05/04/2021, benefits and risks discussed including risk with sedation, risk of bleeding, perforation and infection  -Pentasa 1037m po qid -Continue Dicyclomine 265mpo bid -Continue Methylprednisolone 20 mg IV twice daily -Ondansetron 66m16mo or IV Q 6hrs PRN -Pain management per the hospitalist -Eventual small bowel MRI   2. Normocytic anemia. Stable Hg 10.3 -> 10.6. Iron 31. TIBC 168. Ferritin 46. B12 525. She passed a few black stools since admission. No BM yet today. FOBT +. No menorrhagia.  -Continue Pantoprazole 37m7m bid -EGD and colonoscopy 05/04/2021 -CBC and BMP in am   Active Problems:   Ileitis     LOS: 3 days   CollNoralyn Pick14/2022, 8:50Am

## 2021-05-04 ENCOUNTER — Inpatient Hospital Stay (HOSPITAL_COMMUNITY): Payer: Self-pay

## 2021-05-04 ENCOUNTER — Inpatient Hospital Stay (HOSPITAL_COMMUNITY): Payer: Self-pay | Admitting: Certified Registered"

## 2021-05-04 ENCOUNTER — Encounter (HOSPITAL_COMMUNITY): Payer: Self-pay | Admitting: Internal Medicine

## 2021-05-04 ENCOUNTER — Encounter (HOSPITAL_COMMUNITY)
Admission: EM | Disposition: A | Discharge status: Home / Self Care | Payer: Self-pay | Source: Home / Self Care | Attending: Internal Medicine

## 2021-05-04 DIAGNOSIS — R739 Hyperglycemia, unspecified: Secondary | ICD-10-CM

## 2021-05-04 DIAGNOSIS — D500 Iron deficiency anemia secondary to blood loss (chronic): Secondary | ICD-10-CM

## 2021-05-04 DIAGNOSIS — R195 Other fecal abnormalities: Secondary | ICD-10-CM

## 2021-05-04 HISTORY — PX: ESOPHAGOGASTRODUODENOSCOPY (EGD) WITH PROPOFOL: SHX5813

## 2021-05-04 HISTORY — PX: BIOPSY: SHX5522

## 2021-05-04 HISTORY — PX: COLONOSCOPY WITH PROPOFOL: SHX5780

## 2021-05-04 LAB — BASIC METABOLIC PANEL
Anion gap: 5 (ref 5–15)
BUN: 8 mg/dL (ref 6–20)
CO2: 28 mmol/L (ref 22–32)
Calcium: 8.7 mg/dL — ABNORMAL LOW (ref 8.9–10.3)
Chloride: 103 mmol/L (ref 98–111)
Creatinine, Ser: 0.61 mg/dL (ref 0.44–1.00)
GFR, Estimated: >60 mL/min (ref >60)
Glucose, Bld: 108 mg/dL — ABNORMAL HIGH (ref 70–99)
Potassium: 4 mmol/L (ref 3.5–5.1)
Sodium: 136 mmol/L (ref 135–145)

## 2021-05-04 LAB — CBC
HCT: 34 % — ABNORMAL LOW (ref 36.0–46.0)
Hemoglobin: 10.6 g/dL — ABNORMAL LOW (ref 12.0–15.0)
MCH: 27.3 pg (ref 26.0–34.0)
MCHC: 31.2 g/dL (ref 30.0–36.0)
MCV: 87.6 fL (ref 80.0–100.0)
Platelets: 415 10*3/uL — ABNORMAL HIGH (ref 150–400)
RBC: 3.88 MIL/uL (ref 3.87–5.11)
RDW: 14.9 % (ref 11.5–15.5)
WBC: 7.3 10*3/uL (ref 4.0–10.5)
nRBC: 0 % (ref 0.0–0.2)

## 2021-05-04 SURGERY — ESOPHAGOGASTRODUODENOSCOPY (EGD) WITH PROPOFOL
Anesthesia: Monitor Anesthesia Care

## 2021-05-04 MED ORDER — LIDOCAINE 2% (20 MG/ML) 5 ML SYRINGE
INTRAMUSCULAR | Status: DC | PRN
  Administered 2021-05-04: 40 mg via INTRAVENOUS

## 2021-05-04 MED ORDER — HYDRALAZINE HCL 25 MG PO TABS
25.0000 mg | ORAL_TABLET | Freq: Four times a day (QID) | ORAL | Status: DC | PRN
  Filled 2021-05-04: qty 1

## 2021-05-04 MED ORDER — BARIUM SULFATE 0.1 % PO SUSP: 450.0000 mL | Freq: Three times a day (TID) | ORAL | Status: DC

## 2021-05-04 MED ORDER — GLYCOPYRROLATE 0.2 MG/ML IJ SOLN
INTRAMUSCULAR | Status: DC | PRN
  Administered 2021-05-04: .2 mg via INTRAVENOUS

## 2021-05-04 MED ORDER — PROPOFOL 10 MG/ML IV BOLUS
INTRAVENOUS | Status: DC | PRN
  Administered 2021-05-04: 20 mg via INTRAVENOUS
  Administered 2021-05-04: 50 mg via INTRAVENOUS
  Administered 2021-05-04: 20 mg via INTRAVENOUS

## 2021-05-04 MED ORDER — GADOBUTROL 1 MMOL/ML IV SOLN
6.5000 mL | Freq: Once | INTRAVENOUS | Status: AC | PRN
  Administered 2021-05-04: 6.5 mL via INTRAVENOUS

## 2021-05-04 MED ORDER — LACTATED RINGERS IV SOLN: INTRAVENOUS | Status: DC | PRN

## 2021-05-04 MED ORDER — PROPOFOL 500 MG/50ML IV EMUL
INTRAVENOUS | Status: DC | PRN
  Administered 2021-05-04: 50 ug/kg/min via INTRAVENOUS
  Administered 2021-05-04: 150 ug/kg/min via INTRAVENOUS

## 2021-05-04 SURGICAL SUPPLY — 25 items

## 2021-05-04 NOTE — Progress Notes (Signed)
PROGRESS NOTE    Jennifer Cain  MVE:[id number] DOB: 02/09/1996 DOA: 04/29/2021 PCP: Pcp, No   Brief Narrative: Patient is a 25 year old thin African-American female with a past medical history significant for but not limited to Crohn's disease was not follow-up with gastroenterology since 2019 and has not been on any maintenance medication who presented to the ED with a 2-week history of nausea, vomiting, abdominal pain in the epigastrium and right lower quadrant.  Initially she was seen in the ED on 04/26/2021 and discharged home on prednisone, Bentyl and Zofran.  Subsequently she came back to the ED 04/29/2021 since her symptoms did not improve.  CT was done and showed findings concerning for ileitis.  GI was consulted and have adjusted some medications and she is undergoing further work-up with an EGD and colonoscopy to be done later today.  GI has her on Pentasa 1000 mg p.o. 4 times daily, Solu-Medrol 20 mg IV twice daily as well as supportive care with ondansetron and prochlorperazine.  Assessment & Plan:   Active Problems:   Ileitis   Anemia due to GI blood loss  Nausea, Vomiting, and Abdominal Pain with concern for Crohn's Flare/ileitis  -Patient an outpatient course of steroids and antiemetics etc. -CT of the abdomen pelvis showed "Heterogeneous hepatic enhancement with poorly enhancing subcapsular foci throughout the liver, etiology is unclear. Question intrinsic liver disease versus perfusion  related changes or contrast timing. Findings on a background of more diffuse hypoattenuation which could be seen in the setting of hepatic steatosis as well. Consider further characterization with serologies and if  abnormal, hepatic MRI could be obtained. Mild mural thickening versus underdistention of the terminal ileum. Such appearance could reflect a terminal ileitis particularly in the setting of Crohn's disease. Some  reactive low mesenteric adenopathy is present as well. No organized abscess or  collection or other discernible complication." -Patient continues to have intermittent dark stools and FOBT was positive -GI was recommending clear liquid diet yesterday and then n.p.o. at midnight now for EGD and colonoscopy to be done at 3 PM -GI is added Pentasa 1000 mg p.o. 4 times daily as well as dicyclomine 20 mg p.o. twice daily -Currently she is getting IV Solu-Medrol 20 mg twice daily -Continue with supportive care with ondansetron 4 mg p.o./IV every 6 as needed for nausea vomiting as well as IV prochlorperazine 10 mg every 6 as needed nausea and vomiting -Continue with pain control with oxycodone 5 mg p.o. every 4 as needed for moderate pain -GI recommended eventual small bowel MRI but this can be done outpatient setting  Normocytic Anemia -Stable. Patient's Hgb/Hct went from 10.3/32.5 -> 10.0/31.6 -> 10.6/33.8 -> 10.6/34.0 -Anemia Panel done and showed an Iron Level of 31, UIBC of 137, TIB168, Saturation Ratios of 18, Ferritin of 46, and Vitamin B12 of 525 -FOBT was Positive  -Continue to Monitor for S/Sx of Bleeding; Has been having intermittent Dark Stools -GI is recommending continuing with pantoprazole 40 mg IV twice daily and she is going to go under further work-up with an EGD and colonoscopy today -Repeat CBC in the AM  Hyperglycemia  -Likely related to Steroids but will need to rule out Diabetes -Check HbA1c in the AM  -Patient's Blood Sugar has been ranging from 100-123 on daily BMP/CMP -Continue to Monitor Blood Sugars carefully and if necessary will need to add a Sensitive Novolog SSI AC  DVT prophylaxis: Enoxaparin 40 mg sq q24h Code Status: FULL CODE  Family Communication: No family present at bedside  Disposition Plan: Pending further GI workup and Clearance  Status is: Inpatient  Remains inpatient appropriate because:Unsafe d/c plan, IV treatments appropriate due to intensity of illness or inability to take PO, and Inpatient level of care appropriate due to  severity of illness  Dispo: The patient is from: Home              Anticipated d/c is to: Home              Patient currently is not medically stable to d/c.   Difficult to place patient No  Consultants:  Gastroenterology Dr. Tarri Glenn  Procedures:  EGD and Colon to be done today   Antimicrobials:  Anti-infectives (From admission, onward)    None        Subjective: Seen and examined at bedside, still having some abdominal pain and nausea.  Felt hungry.  No lightheadedness or dizziness.  No other concerns or complaints at this time  Objective: Vitals:   05/03/21 0430 05/03/21 1254 05/03/21 2253 05/04/21 0549  BP: 130/84 126/90 (!) 127/95 (!) 145/95  Pulse: 95 (!) 47 (!) 47 (!) 44  Resp: 17 17 17 17   Temp: 98.5 F (36.9 C) 98.6 F (37 C) (!) 97.5 F (36.4 C) 97.8 F (36.6 C)  TempSrc: Oral Oral Oral Oral  SpO2: 100% 100% 100% 100%  Weight:      Height:        Intake/Output Summary (Last 24 hours) at 05/04/2021 0813 Last data filed at 05/04/2021 0600 Gross per 24 hour  Intake 3745.11 ml  Output 0 ml  Net 3745.11 ml   Filed Weights   04/29/21 2153  Weight: 64.4 kg    Examination: Physical Exam:  Constitutional: WN/WD Thin AAF in NAD and appears calm and comfortable Eyes: Lids and conjunctivae normal, sclerae anicteric  ENMT: External Ears, Nose appear normal. Grossly normal hearing.  Neck: Appears normal, supple, no cervical masses, normal ROM, no appreciable thyromegaly Respiratory: Clear to auscultation bilaterally, no wheezing, rales, rhonchi or crackles. Normal respiratory effort and patient is not tachypenic. No accessory muscle use. Unlabored breathing Cardiovascular: Slightly Bradycardic, no murmurs / rubs / gallops. No appreciable LE edema.  Abdomen: Soft, Tender, non-distended. Bowel sounds positive.  GU: Deferred. Musculoskeletal: No clubbing / cyanosis of digits/nails. No joint deformity upper and lower extremities.  Skin: No rashes, lesions, ulcers  on a limited skin evaluation. No induration; Warm and dry.  Neurologic: CN 2-12 grossly intact with no focal deficits. Romberg sign and cerebellar reflexes not assessed.  Psychiatric: Normal judgment and insight. Alert and oriented x 3. Normal mood and appropriate affect.   Data Reviewed: I have personally reviewed following labs and imaging studies  CBC: Recent Labs  Lab 04/29/21 0032 05/01/21 0459 05/02/21 0450 05/03/21 0628 05/04/21 0441  WBC 7.4 5.1 7.2  --  7.3  NEUTROABS 5.3  --   --   --   --   HGB 10.9* 10.3* 10.0* 10.6* 10.6*  HCT 34.1* 32.5* 31.6* 33.8* 34.0*  MCV 86.5 88.1 88.3  --  87.6  PLT 437* 420* 406*  --  128*   Basic Metabolic Panel: Recent Labs  Lab 04/29/21 0032 05/01/21 0459 05/02/21 0450 05/03/21 0628 05/04/21 0441  NA 138 138 138 136 136  K 3.9 4.6 4.5 4.4 4.0  CL 100 103 103 100 103  CO2 29 26 30 28 28   GLUCOSE 96 100* 123* 113* 108*  BUN 12 12 12 11 8   CREATININE 0.81 0.77 0.82 0.85 0.61  CALCIUM 9.2 8.8* 9.0 9.2 8.7*   GFR: Estimated Creatinine Clearance: 92.8 mL/min (by C-G formula based on SCr of 0.61 mg/dL). Liver Function Tests: Recent Labs  Lab 04/29/21 0032 05/01/21 0459  AST 11* 10*  ALT 8 8  ALKPHOS 65 59  BILITOT 0.2* 0.2*  PROT 8.3* 6.7  ALBUMIN 2.8* 2.3*   Recent Labs  Lab 04/29/21 0032  LIPASE 24   No results for input(s): AMMONIA in the last 168 hours. Coagulation Profile: No results for input(s): INR, PROTIME in the last 168 hours. Cardiac Enzymes: No results for input(s): CKTOTAL, CKMB, CKMBINDEX, TROPONINI in the last 168 hours. BNP (last 3 results) No results for input(s): PROBNP in the last 8760 hours. HbA1C: No results for input(s): HGBA1C in the last 72 hours. CBG: No results for input(s): GLUCAP in the last 168 hours. Lipid Profile: No results for input(s): CHOL, HDL, LDLCALC, TRIG, CHOLHDL, LDLDIRECT in the last 72 hours. Thyroid Function Tests: No results for input(s): TSH, T4TOTAL, FREET4,  T3FREE, THYROIDAB in the last 72 hours. Anemia Panel: Recent Labs    05/02/21 0817  VITAMINB12 525   Sepsis Labs: No results for input(s): PROCALCITON, LATICACIDVEN in the last 168 hours.  Recent Results (from the past 240 hour(s))  Resp Panel by RT-PCR (Flu A&B, Covid) Nasopharyngeal Swab     Status: None   Collection Time: 04/30/21  2:31 AM   Specimen: Nasopharyngeal Swab; Nasopharyngeal(NP) swabs in vial transport medium  Result Value Ref Range Status   SARS Coronavirus 2 by RT PCR NEGATIVE NEGATIVE Final    Comment: (NOTE) SARS-CoV-2 target nucleic acids are NOT DETECTED.  The SARS-CoV-2 RNA is generally detectable in upper respiratory specimens during the acute phase of infection. The lowest concentration of SARS-CoV-2 viral copies this assay can detect is 138 copies/mL. A negative result does not preclude SARS-Cov-2 infection and should not be used as the sole basis for treatment or other patient management decisions. A negative result may occur with  improper specimen collection/handling, submission of specimen other than nasopharyngeal swab, presence of viral mutation(s) within the areas targeted by this assay, and inadequate number of viral copies(<138 copies/mL). A negative result must be combined with clinical observations, patient history, and epidemiological information. The expected result is Negative.  Fact Sheet for Patients:  EntrepreneurPulse.com.au  Fact Sheet for Healthcare Providers:  IncredibleEmployment.be  This test is no t yet approved or cleared by the [place] FDA and  has been authorized for detection and/or diagnosis of SARS-CoV-2 by FDA under an Emergency Use Authorization (EUA). This EUA will remain  in effect (meaning this test can be used) for the duration of the COVID-19 declaration under Section 564(b)(1) of the Act, 21 U.S.C.section 360bbb-3(b)(1), unless the authorization is terminated  or revoked  sooner.       Influenza A by PCR NEGATIVE NEGATIVE Final   Influenza B by PCR NEGATIVE NEGATIVE Final    Comment: (NOTE) The Xpert Xpress SARS-CoV-2/FLU/RSV plus assay is intended as an aid in the diagnosis of influenza from Nasopharyngeal swab specimens and should not be used as a sole basis for treatment. Nasal washings and aspirates are unacceptable for Xpert Xpress SARS-CoV-2/FLU/RSV testing.  Fact Sheet for Patients: EntrepreneurPulse.com.au  Fact Sheet for Healthcare Providers: IncredibleEmployment.be  This test is not yet approved or cleared by the [place] FDA and has been authorized for detection and/or diagnosis of SARS-CoV-2 by FDA under an Emergency Use Authorization (EUA). This EUA will remain in effect (meaning this test can  be used) for the duration of the COVID-19 declaration under Section 564(b)(1) of the Act, 21 U.S.C. section 360bbb-3(b)(1), unless the authorization is terminated or revoked.  Performed at [facility], [address] [place], [place] [address]   Calprotectin, Fecal     Status: Abnormal   Collection Time: 05/01/21  6:00 PM   Specimen: Stool  Result Value Ref Range Status   Calprotectin, Fecal 336 (H) 0 - 120 ug/g Final    Comment: (NOTE) Concentration     Interpretation   Follow-Up <16 - 50 ug/g     Normal           None >50 -120 ug/g     Borderline       Re-evaluate in 4-6 weeks    >120 ug/g     Abnormal         Repeat as clinically                                   indicated Performed At: [facility]: [address] [place], [place] [id number] Rush Farmer MD FO:[id number]     RN Pressure Injury Documentation:     Estimated body mass index is 24.37 kg/m as calculated from the following:   Height as of this encounter: 5' 4"  (1.626 m).   Weight as of this encounter: 64.4 kg.  Malnutrition Type:   Malnutrition Characteristics:   Nutrition Interventions:     Radiology Studies: No results found.  Scheduled Meds:  dicyclomine  20 mg Oral BID   enoxaparin (LOVENOX) injection  40 mg Subcutaneous Q24H   mesalamine  1,000 mg Oral QID   methylPREDNISolone (SOLU-MEDROL) injection  20 mg Intravenous BID   pantoprazole (PROTONIX) IV  40 mg Intravenous Q24H   Continuous Infusions:  sodium chloride 20 mL/hr at 05/03/21 1019    LOS: 4 days   Kerney Elbe, DO [facility] PAGER is on AMION  If 7PM-7AM, please contact night-coverage [email or web address]

## 2021-05-04 NOTE — Transfer of Care (Signed)
Immediate Anesthesia Transfer of Care Note  Patient: Jennifer Cain  Procedure(s) Performed: ESOPHAGOGASTRODUODENOSCOPY (EGD) WITH PROPOFOL COLONOSCOPY WITH PROPOFOL BIOPSY  Patient Location: PACU  Anesthesia Type:MAC  Level of Consciousness: awake, alert , oriented and patient cooperative  Airway & Oxygen Therapy: Patient Spontanous Breathing and Patient connected to nasal cannula oxygen  Post-op Assessment: Report given to RN and Post -op Vital signs reviewed and stable  Post vital signs: Reviewed and stable  Last Vitals:  Vitals Value Taken Time  BP 136/75 05/04/21 1457  Temp    Pulse 47 05/04/21 1459  Resp 19 05/04/21 1459  SpO2 100 % 05/04/21 1459  Vitals shown include unvalidated device data.  Last Pain:  Vitals:   05/04/21 1327  TempSrc: Oral  PainSc: 0-No pain         Complications: No notable events documented.

## 2021-05-04 NOTE — Anesthesia Preprocedure Evaluation (Addendum)
Anesthesia Evaluation  Patient identified by MRN, date of birth, ID band Patient awake    Reviewed: Allergy & Precautions, NPO status , Patient's Chart, lab work & pertinent test results  History of Anesthesia Complications Negative for: history of anesthetic complications  Airway Mallampati: I       Dental no notable dental hx.    Pulmonary neg pulmonary ROS,    Pulmonary exam normal        Cardiovascular negative cardio ROS Normal cardiovascular exam     Neuro/Psych negative neurological ROS  negative psych ROS   GI/Hepatic negative GI ROS, Neg liver ROS, Crohn's dx   Endo/Other  negative endocrine ROS  Renal/GU negative Renal ROS  negative genitourinary   Musculoskeletal negative musculoskeletal ROS (+)   Abdominal Normal abdominal exam  (+)   Peds  Hematology  (+) anemia , Hgb 10.6   Anesthesia Other Findings Day of surgery medications reviewed with patient.  Reproductive/Obstetrics negative OB ROS                           Anesthesia Physical Anesthesia Plan  ASA: 2  Anesthesia Plan: MAC   Post-op Pain Management:    Induction:   PONV Risk Score and Plan: Treatment may vary due to age or medical condition and Propofol infusion  Airway Management Planned: Natural Airway and Mask  Additional Equipment:   Intra-op Plan:   Post-operative Plan:   Informed Consent: I have reviewed the patients History and Physical, chart, labs and discussed the procedure including the risks, benefits and alternatives for the proposed anesthesia with the patient or authorized representative who has indicated his/her understanding and acceptance.     Dental advisory given  Plan Discussed with: CRNA  Anesthesia Plan Comments:        Anesthesia Quick Evaluation

## 2021-05-04 NOTE — Op Note (Signed)
[facility] Patient Name: Jennifer Cain Procedure Date: 05/04/2021 MRN: [id number] Attending MD: Thornton Park MD, MD Date of Birth: [birth year] CSN: [id number] Age: 25 Admit Type: Outpatient Procedure:                Colonoscopy Indications:              Gastrointestinal occult blood loss, history of                            small bowel Crohn's Providers:                Thornton Park MD, MD, Jerene Pitch Person, Benetta Spar, Technician Referring MD:              Medicines:                Monitored Anesthesia Care Complications:            No immediate complications. Estimated blood loss:                            Minimal. Estimated Blood Loss:     Estimated blood loss was minimal. Procedure:                Pre-Anesthesia Assessment:                           - Prior to the procedure, a History and Physical                            was performed, and patient medications and                            allergies were reviewed. The patient's tolerance of                            previous anesthesia was also reviewed. The risks                            and benefits of the procedure and the sedation                            options and risks were discussed with the patient.                            All questions were answered, and informed consent                            was obtained. Prior Anticoagulants: The patient has                            taken no previous anticoagulant or antiplatelet                            agents. ASA Grade Assessment: II - A  patient with                            mild systemic disease. After reviewing the risks                            and benefits, the patient was deemed in                            satisfactory condition to undergo the procedure.                           - Prior to the procedure, a History and Physical                            was performed, and patient medications and                             allergies were reviewed. The patient's tolerance of                            previous anesthesia was also reviewed. The risks                            and benefits of the procedure and the sedation                            options and risks were discussed with the patient.                            All questions were answered, and informed consent                            was obtained. Prior Anticoagulants: The patient has                            taken no previous anticoagulant or antiplatelet                            agents. ASA Grade Assessment: II - A patient with                            mild systemic disease. After reviewing the risks                            and benefits, the patient was deemed in                            satisfactory condition to undergo the procedure.                           After obtaining informed consent, the colonoscope  was passed under direct vision. Throughout the                            procedure, the patient's blood pressure, pulse, and                            oxygen saturations were monitored continuously. The                            CF-HQ190L ([id number] Olympus colonoscope was                            introduced through the anus and advanced to the 8                            cm into the ileum. The colonoscopy was performed                            without difficulty. The patient tolerated the                            procedure well. The quality of the bowel                            preparation was adequate although there was some                            fiercely adherent stool throughout the colon. The                            terminal ileum, ileocecal valve, appendiceal                            orifice, and rectum were photographed. Scope In: 2:33:05 PM Scope Out: 2:52:01 PM Scope Withdrawal Time: 0 hours 14 minutes 28 seconds  Total Procedure Duration:  0 hours 18 minutes 55 seconds  Findings:      The perianal and digital rectal examinations were normal.      The entire examined colonic mucosa appeared normal. Biopsies were taken       from the right colon, transverse colon, and left colon with a cold       forceps for histology. Estimated blood loss was minimal.      The terminal ileum appeared normal except for some mild patchy erythema.       Biopsies were taken with a cold forceps for histology. Estimated blood       loss was minimal.      The exam was otherwise without abnormality on direct and retroflexion       views. Impression:               - The entire examined colon is normal. Biopsied.                           - The examined portion of the ileum was normal  except for mild, patchy erythema. Biopsied.                           - The examination was otherwise normal on direct                            and retroflexion views. Moderate Sedation:      Not Applicable - Patient had care per Anesthesia. Recommendation:           - Return patient to hospital ward for ongoing care.                           - Advance diet as tolerated.                           - Continue present medications.                           - Await pathology results.                           - Proceed with MRE for further evaluation of the                            small bowel. Procedure Code(s):        --- Professional ---                           [phone], Colonoscopy, flexible; with biopsy, single                            or multiple Diagnosis Code(s):        --- Professional ---                           R19.5, Other fecal abnormalities CPT copyright 2019 [employer]. All rights reserved. The codes documented in this report are preliminary and upon coder review may  be revised to meet current compliance requirements. Thornton Park MD, MD 05/04/2021 3:11:21 PM This report has been signed  electronically. Number of Addenda: 0

## 2021-05-04 NOTE — Op Note (Signed)
[facility] Patient Name: Jennifer Cain Procedure Date: 05/04/2021 MRN: [id number] Attending MD: Thornton Park MD, MD Date of Birth: Feb 02, 1996 CSN: [id number] Age: 25 Admit Type: Outpatient Procedure:                Upper GI endoscopy Indications:              Heme positive stool, Nausea Providers:                Thornton Park MD, MD, Jerene Pitch Person, Benetta Spar, Technician Referring MD:              Medicines:                Monitored Anesthesia Care Complications:            No immediate complications. Estimated blood loss:                            Minimal. Estimated Blood Loss:     Estimated blood loss was minimal. Procedure:                Pre-Anesthesia Assessment:                           - Prior to the procedure, a History and Physical                            was performed, and patient medications and                            allergies were reviewed. The patient's tolerance of                            previous anesthesia was also reviewed. The risks                            and benefits of the procedure and the sedation                            options and risks were discussed with the patient.                            All questions were answered, and informed consent                            was obtained. Prior Anticoagulants: The patient has                            taken no previous anticoagulant or antiplatelet                            agents. ASA Grade Assessment: II - A patient with                            mild  systemic disease. After reviewing the risks                            and benefits, the patient was deemed in                            satisfactory condition to undergo the procedure.                           After obtaining informed consent, the endoscope was                            passed under direct vision. Throughout the                            procedure, the patient's blood  pressure, pulse, and                            oxygen saturations were monitored continuously. The                            GIF-H190 ([id number]) Olympus gastroscope was                            introduced through the mouth, and advanced to the                            third part of duodenum. The upper GI endoscopy was                            accomplished without difficulty. The patient                            tolerated the procedure well. Scope In: Scope Out: Findings:      The examined esophagus was normal. Biopsies were taken with a cold       forceps for histology. Estimated blood loss was minimal.      A small hiatal hernia was present.      The entire examined stomach was normal. Biopsies were taken with a cold       forceps for histology. Estimated blood loss was minimal.      The examined duodenum was normal. Biopsies were taken with a cold       forceps for histology. Estimated blood loss was minimal.      The cardia and gastric fundus were normal on retroflexion. Impression:               - Normal esophagus. Biopsied.                           - Normal stomach. Biopsied.                           - Normal examined duodenum. Biopsied. Moderate Sedation:      Not Applicable - Patient had care per Anesthesia. Recommendation:           -  Advance diet as tolerated.                           - Continue present medications.                           - Await pathology results.                           - Proceed with colonoscopy today as previously                            planned. Procedure Code(s):        --- Professional ---                           [phone], Esophagogastroduodenoscopy, flexible,                            transoral; with biopsy, single or multiple Diagnosis Code(s):        --- Professional ---                           R19.5, Other fecal abnormalities                           R11.0, Nausea CPT copyright 2019 [employer]. All rights  reserved. The codes documented in this report are preliminary and upon coder review may  be revised to meet current compliance requirements. Thornton Park MD, MD 05/04/2021 3:07:35 PM This report has been signed electronically. Number of Addenda: 0

## 2021-05-04 NOTE — Interval H&P Note (Signed)
History and Physical Interval Note:  05/04/2021 2:03 PM  Jennifer Cain  has presented today for surgery, with the diagnosis of Crohn's ileitis. Nausea/vomiting. + FOBT..  The various methods of treatment have been discussed with the patient and family. After consideration of risks, benefits and other options for treatment, the patient has consented to  Procedure(s): ESOPHAGOGASTRODUODENOSCOPY (EGD) WITH PROPOFOL (N/A) COLONOSCOPY WITH PROPOFOL (N/A) as a surgical intervention.  The patient's history has been reviewed, patient examined, no change in status, stable for surgery.  I have reviewed the patient's chart and labs.  Questions were answered to the patient's satisfaction.     Thornton Park

## 2021-05-05 ENCOUNTER — Encounter (HOSPITAL_COMMUNITY): Payer: Self-pay | Admitting: Gastroenterology

## 2021-05-05 DIAGNOSIS — K50919 Crohn's disease, unspecified, with unspecified complications: Secondary | ICD-10-CM

## 2021-05-05 LAB — COMPREHENSIVE METABOLIC PANEL
ALT: 14 U/L (ref 0–44)
AST: 12 U/L — ABNORMAL LOW (ref 15–41)
Albumin: 2.4 g/dL — ABNORMAL LOW (ref 3.5–5.0)
Alkaline Phosphatase: 55 U/L (ref 38–126)
Anion gap: 7 (ref 5–15)
BUN: 10 mg/dL (ref 6–20)
CO2: 26 mmol/L (ref 22–32)
Calcium: 9.2 mg/dL (ref 8.9–10.3)
Chloride: 104 mmol/L (ref 98–111)
Creatinine, Ser: 0.44 mg/dL (ref 0.44–1.00)
GFR, Estimated: >60 mL/min (ref >60)
Glucose, Bld: 104 mg/dL — ABNORMAL HIGH (ref 70–99)
Potassium: 4.3 mmol/L (ref 3.5–5.1)
Sodium: 137 mmol/L (ref 135–145)
Total Bilirubin: 0.1 mg/dL — ABNORMAL LOW (ref 0.3–1.2)
Total Protein: 7.2 g/dL (ref 6.5–8.1)

## 2021-05-05 LAB — CBC WITH DIFFERENTIAL/PLATELET
Abs Immature Granulocytes: 0.05 10*3/uL (ref 0.00–0.07)
Basophils Absolute: 0 10*3/uL (ref 0.0–0.1)
Basophils Relative: 0 %
Eosinophils Absolute: 0 10*3/uL (ref 0.0–0.5)
Eosinophils Relative: 0 %
HCT: 34.9 % — ABNORMAL LOW (ref 36.0–46.0)
Hemoglobin: 11.1 g/dL — ABNORMAL LOW (ref 12.0–15.0)
Immature Granulocytes: 1 %
Lymphocytes Relative: 17 %
Lymphs Abs: 1.1 10*3/uL (ref 0.7–4.0)
MCH: 28 pg (ref 26.0–34.0)
MCHC: 31.8 g/dL (ref 30.0–36.0)
MCV: 87.9 fL (ref 80.0–100.0)
Monocytes Absolute: 0.3 10*3/uL (ref 0.1–1.0)
Monocytes Relative: 4 %
Neutro Abs: 5.4 10*3/uL (ref 1.7–7.7)
Neutrophils Relative %: 78 %
Platelets: 437 10*3/uL — ABNORMAL HIGH (ref 150–400)
RBC: 3.97 MIL/uL (ref 3.87–5.11)
RDW: 14.8 % (ref 11.5–15.5)
WBC: 6.9 10*3/uL (ref 4.0–10.5)
nRBC: 0 % (ref 0.0–0.2)

## 2021-05-05 LAB — MAGNESIUM: Magnesium: 2 mg/dL (ref 1.7–2.4)

## 2021-05-05 LAB — PHOSPHORUS: Phosphorus: 4.3 mg/dL (ref 2.5–4.6)

## 2021-05-05 LAB — SURGICAL PATHOLOGY

## 2021-05-05 MED ORDER — PREDNISONE 5 MG PO TABS: ORAL_TABLET | ORAL | 0 refills | Status: AC

## 2021-05-05 MED ORDER — PREDNISONE 10 MG PO TABS: 10.0000 mg | ORAL_TABLET | Freq: Every day | ORAL | Status: DC

## 2021-05-05 MED ORDER — PANTOPRAZOLE SODIUM 40 MG PO TBEC
40.0000 mg | DELAYED_RELEASE_TABLET | Freq: Every day | ORAL | Status: DC
  Administered 2021-05-05: 40 mg via ORAL
  Filled 2021-05-05: qty 1

## 2021-05-05 MED ORDER — ACETAMINOPHEN 325 MG PO TABS: 650.0000 mg | ORAL_TABLET | Freq: Four times a day (QID) | ORAL | 0 refills | Status: DC | PRN

## 2021-05-05 MED ORDER — PREDNISONE 10 MG PO TABS: 20.0000 mg | ORAL_TABLET | Freq: Every day | ORAL | Status: DC

## 2021-05-05 MED ORDER — PREDNISONE 20 MG PO TABS: 40.0000 mg | ORAL_TABLET | Freq: Every day | ORAL | Status: DC

## 2021-05-05 MED ORDER — PANTOPRAZOLE SODIUM 40 MG PO TBEC: 40.0000 mg | DELAYED_RELEASE_TABLET | Freq: Every day | ORAL | 0 refills | Status: DC

## 2021-05-05 MED ORDER — PREDNISONE 10 MG PO TABS
40.0000 mg | ORAL_TABLET | Freq: Every day | ORAL | Status: DC
  Administered 2021-05-05: 40 mg via ORAL
  Filled 2021-05-05: qty 4

## 2021-05-05 MED ORDER — PREDNISONE 10 MG PO TABS: 30.0000 mg | ORAL_TABLET | Freq: Every day | ORAL | Status: DC

## 2021-05-05 MED ORDER — MESALAMINE ER 250 MG PO CPCR: 1000.0000 mg | ORAL_CAPSULE | Freq: Four times a day (QID) | ORAL | 0 refills | Status: DC

## 2021-05-05 MED ORDER — PREDNISONE 10 MG PO TABS: 15.0000 mg | ORAL_TABLET | Freq: Every day | ORAL | Status: DC

## 2021-05-05 MED ORDER — PREDNISONE 10 MG PO TABS: 25.0000 mg | ORAL_TABLET | Freq: Every day | ORAL | Status: DC

## 2021-05-05 MED ORDER — PREDNISONE 10 MG PO TABS: 35.0000 mg | ORAL_TABLET | Freq: Every day | ORAL | Status: DC

## 2021-05-05 MED ORDER — PREDNISONE 10 MG PO TABS: 5.0000 mg | ORAL_TABLET | Freq: Every day | ORAL | Status: DC

## 2021-05-05 NOTE — Progress Notes (Signed)
Discharge package printed and instructions given to patient. Verbalizes understanding.

## 2021-05-05 NOTE — Progress Notes (Signed)
[facility] Progress Note  CC:   Crohn's ileitis/flare  Subjective:  She had one episode of watery brown diarrhea yesterday evening after drinking contrast for small bowel MRI. No further diarrhea this morning. No N/V.  Same epigastric discomfort. Regular diet ordered for breakfast, not yet received.    Objective:  Small bowel MRI 05/04/2021: Results pending   EGD 05/04/2021: - Normal esophagus. Biopsied. - Small hiatal hernia - Normal stomach. Biopsied. - Normal examined duodenum. Biopsied  Colonoscopy 05/04/2021: - The entire examined colon is normal. Biopsied. - The examined portion of the ileum was normal except for mild, patchy erythema. Biopsied. - The examination was otherwise normal on direct and retroflexion views.  Vital signs in last 24 hours: Temp:  [97.6 F (36.4 C)-98 F (36.7 C)] 97.8 F (36.6 C) (06/16 0430) Pulse Rate:  [43-76] 45 (06/16 0430) Resp:  [9-22] 16 (06/16 0430) BP: (105-167)/(61-108) 114/82 (06/16 0430) SpO2:  [99 %-100 %] 100 % (06/16 0430) Weight:  [64.9 kg] 64.9 kg (06/15 1327) Last BM Date: 05/04/21 General:   Alert in NAD. Heart: RRR, no murmur.  Pulm:  Lungs clear throughout.  Abdomen: Soft, nondistended. Mild epigastric tenderness without rebound or guarding.  Extremities:  Without edema. Neurologic:  Alert and  oriented x4;  Grossly normal neurologically. Psych:  Alert and cooperative. Normal mood and affect.  Intake/Output from previous day: 06/15 0701 - 06/16 0700 In: 1130.3 [P.O.:400; I.V.:730.3] Out: 0  Intake/Output this shift: No intake/output data recorded.  Lab Results: Recent Labs    05/03/21 0628 05/04/21 0441 05/05/21 0444  WBC  --  7.3 6.9  HGB 10.6* 10.6* 11.1*  HCT 33.8* 34.0* 34.9*  PLT  --  415* 437*   BMET Recent Labs    05/03/21 0628 05/04/21 0441 05/05/21 0444  NA 136 136 137  K 4.4 4.0 4.3  CL 100 103 104  CO2 28 28 26   GLUCOSE 113* 108* 104*  BUN 11 8 10   CREATININE 0.85  0.61 0.44  CALCIUM 9.2 8.7* 9.2   LFT Recent Labs    05/05/21 0444  PROT 7.2  ALBUMIN 2.4*  AST 12*  ALT 14  ALKPHOS 55  BILITOT 0.1*   PT/INR No results for input(s): LABPROT, INR in the last 72 hours. Hepatitis Panel No results for input(s): HEPBSAG, HCVAB, HEPAIGM, HEPBIGM in the last 72 hours.  No results found.  Assessment / Plan:  1. 25 y.o. female with a past medical history of small bowel Crohn's disease (question of colonic involvement by biopsies in 2019 though acute inflammation only in the colon at that time) admitted to the hospital 05/01/2021 with ongoing issues with abdominal pain, nausea, vomiting. CTAP w/contrast 6/11 showed mild mural thickening versus underdistention of the TI with mesenteric adenopathy.  No evidence of a bowel obstruction. Heterogeneous hepatic enhancement with poorly enhancing subscapular foci throughout the liver was noted, etiology is unclear. Fecal Calprotectin level 336. Normal LFTs. Repeat EGD 6/15 showed a small hiatal hernia otherwise was normal. Colonoscopy showed mild inflammation at the TI, no colitis.  -Continue Pentasa 1gm po qid -Continue Dicyclomine 373m one po bid PRN abdominal pain  -Stop Methylprednisolone IV -Start Prednisone 470mpo QD x 1 week then reduce by 73m6m week until off -Change PPI to Pantoprazole 54m39m QD, stay on Pantoprazole po until Prednisone taper completed  -Follow up appointment with Dr. CiriBryan Lemma2/2022 at  9AM [facility]charge home today from GI standpoint  -I will call the  patient with her small bowel MRI results if not received by the time of discharge    2. Normocytic anemia. Stable Hg 10.3 -> 10.6 -> 11.1. Iron 31. TIBC 168. Ferritin 46. B12 525. She passed a few black stools since admission. Last night passed a brown watery stool after drinking the contrast for small bowel MRI. FOBT +. No menorrhagia.   Active Problems:   Ileitis   Anemia due to GI blood loss     LOS: 5 days   Noralyn Pick  05/05/2021, 7:31 AM

## 2021-05-05 NOTE — Discharge Summary (Signed)
Physician Discharge Summary  [place] Scaffidi TDV:[id number] DOB: 24-Nov-1995 DOA: 04/29/2021  PCP: Pcp, No  Admit date: 04/29/2021 Discharge date: 05/05/2021  Admitted From: Home Disposition: Home  Recommendations for Outpatient Follow-up:  Follow up with PCP in 1-2 weeks Follow up with Gastroenterology within 1-2 weeks  Please obtain CMP/CBC, Mag, Phos in one week Please follow up on the following pending results: MR E results and biopsy results taken  Home Health: No  Equipment/Devices: None   Discharge Condition: Stable CODE STATUS: FULL CODE Diet recommendation: Regular   Brief/Interim Summary: Patient is a 25 year old thin African-American female with a past medical history significant for but not limited to Crohn's disease was not follow-up with gastroenterology since 2019 and has not been on any maintenance medication who presented to the ED with a 2-week history of nausea, vomiting, abdominal pain in the epigastrium and right lower quadrant.  Initially she was seen in the ED on 04/26/2021 and discharged home on prednisone, Bentyl and Zofran.  Subsequently she came back to the ED 04/29/2021 since her symptoms did not improve.  CT was done and showed findings concerning for ileitis.  GI was consulted and have adjusted some medications and she is undergoing further work-up with an EGD and colonoscopy.  GI has her on Pentasa 1000 mg p.o. 4 times daily, Solu-Medrol 20 mg IV twice daily as well as supportive care with ondansetron and prochlorperazine.   EGD done and colonoscopy were essentially normal.  Subsequently GI ordered an MR E which is still pending to be read.  GI evaluated today and felt that she was stable to be discharged home and follow-up on the biopsies and MRE as an outpatient.  GI recommended converting steroids to p.o. as well as PPI drip oral formulation and continue Pentasa and dicyclomine at discharge.  They are following up with the patient outpatient and will go over the  results of the MRI and the biopsy results.  She is deemed stable to be discharged at this time and follow-up with GI and establish with a PCP.  Discharge Diagnoses:  Active Problems:   Ileitis   Anemia due to GI blood loss  Nausea, Vomiting, and Abdominal Pain with concern for Crohn's Flare/ileitis  -Patient an outpatient course of steroids and antiemetics etc. -CT of the abdomen pelvis showed "Heterogeneous hepatic enhancement with poorly enhancing subcapsular foci throughout the liver, etiology is unclear. Question intrinsic liver disease versus perfusion  related changes or contrast timing. Findings on a background of more diffuse hypoattenuation which could be seen in the setting of hepatic steatosis as well. Consider further characterization with serologies and if  abnormal, hepatic MRI could be obtained. Mild mural thickening versus underdistention of the terminal ileum. Such appearance could reflect a terminal ileitis particularly in the setting of Crohn's disease. Some  reactive low mesenteric adenopathy is present as well. No organized abscess or collection or other discernible complication." -Patient continues to have intermittent dark stools and FOBT was positive -GI was recommending clear liquid diet yesterday and then n.p.o. at midnight now for EGD and colonoscopy  -EGD and Colonosocpy done were essential Normal and below  -GI is added Pentasa 1000 mg p.o. 4 times daily as well as dicyclomine 20 mg p.o. twice daily and recommending continuing at D/C  -Currently she is getting IV Solu-Medrol 20 mg twice daily and GIs change her to p.o. prednisone 40 mg daily and reducing by 5 mg weekly until she finishes out her course -They have also changed her PPI  from IV to p.o. -Continue with supportive care with ondansetron 4 mg p.o./IV every 6 as needed for nausea vomiting as well as IV prochlorperazine 10 mg every 6 as needed nausea and vomiting -Continue with pain control with oxycodone 5 mg  p.o. every 4 as needed for moderate pain while hospitalized but will send out with acetaminophen as her pain is fairly well controlled -GI recommended eventual small bowel MRI and she underwent an MR E yesterday but GI states that they will follow up on this and call the patient  Normocytic Anemia -Stable. Patient's Hgb/Hct went from 10.3/32.5 -> 10.0/31.6 -> 10.6/33.8 -> 10.6/34.0 -> 11.1/34.9 -Anemia Panel done and showed an Iron Level of 31, UIBC of 137, TIB168, Saturation Ratios of 18, Ferritin of 46, and Vitamin B12 of 525 -FOBT was Positive  -Continue to Monitor for S/Sx of Bleeding; Has been having intermittent Dark Stools -GI is recommending continuing with pantoprazole 40 mg IV twice daily and she is going to go under further work-up with an EGD and colonoscopy which were essentially normal -Repeat CBC in the AM which  Thrombocytosis -Likely Reactive -Patient's Platelet Count went from 437 -> 420 -> 406 -> 415 -> 437 -Continue to Monitor and Trend -Repeat CBC within 1 week  Hyperglycemia  -Likely related to Steroids but will need to rule out Diabetes -Check HbA1c as an outpatient to rule out diabetes -Patient's Blood Sugar has been ranging from 100-123 on daily BMP/CMP; this AM was 104 -Continue to Monitor Blood Sugars carefully and if necessary will need to add a Sensitive Novolog SSI [facility]   Discharge Instructions  Discharge Instructions     Call MD for:  difficulty breathing, headache or visual disturbances   Complete by: As directed    Call MD for:  extreme fatigue   Complete by: As directed    Call MD for:  hives   Complete by: As directed    Call MD for:  persistant dizziness or light-headedness   Complete by: As directed    Call MD for:  persistant nausea and vomiting   Complete by: As directed    Call MD for:  redness, tenderness, or signs of infection (pain, swelling, redness, odor or green/yellow discharge around incision site)   Complete by: As directed     Call MD for:  severe uncontrolled pain   Complete by: As directed    Call MD for:  temperature >100.4   Complete by: As directed    Diet - low sodium heart healthy   Complete by: As directed    Discharge instructions   Complete by: As directed    You were cared for by a hospitalist during your hospital stay. If you have any questions about your discharge medications or the care you received while you were in the hospital after you are discharged, you can call the unit and ask to speak with the hospitalist on call if the hospitalist that took care of you is not available. Once you are discharged, your primary care physician will handle any further medical issues. Please note that NO REFILLS for any discharge medications will be authorized once you are discharged, as it is imperative that you return to your primary care physician (or establish a relationship with a primary care physician if you do not have one) for your aftercare needs so that they can reassess your need for medications and monitor your lab values.  Follow up with PCP and Gastroenterology within 1-2 weeks. Take all medications  as prescribed. If symptoms change or worsen please return to the ED for evaluation   Increase activity slowly   Complete by: As directed       Allergies as of 05/05/2021   No Known Allergies      Medication List     STOP taking these medications    benzonatate 100 MG capsule Commonly known as: TESSALON   calcium-vitamin D 500-200 MG-UNIT tablet Commonly known as: Oscal 500/200 D-3   feeding supplement Liqd   lidocaine 5 % ointment Commonly known as: XYLOCAINE       TAKE these medications    acetaminophen 325 MG tablet Commonly known as: TYLENOL Take 2 tablets (650 mg total) by mouth every 6 (six) hours as needed for up to 20 doses for mild pain (or Fever >/= 101).   dicyclomine 20 MG tablet Commonly known as: BENTYL Take 1 tablet (20 mg total) by mouth 2 (two) times daily.    ferrous sulfate 325 (65 FE) MG tablet Take 1 tablet (325 mg total) by mouth daily with breakfast.   mesalamine 250 MG CR capsule Commonly known as: PENTASA Take 4 capsules (1,000 mg total) by mouth 4 (four) times daily.   multivitamin with minerals Tabs tablet Take 1 tablet by mouth daily.   ondansetron 4 MG disintegrating tablet Commonly known as: Zofran ODT Take 1 tablet (4 mg total) by mouth every 8 (eight) hours as needed for nausea or vomiting.   pantoprazole 40 MG tablet Commonly known as: PROTONIX Take 1 tablet (40 mg total) by mouth daily. Start taking on: May 06, 2021   predniSONE 5 MG tablet Commonly known as: DELTASONE Take 8 tablets (40 mg total) by mouth daily with breakfast for 7 days, THEN 7 tablets (35 mg total) daily with breakfast for 7 days, THEN 6 tablets (30 mg total) daily with breakfast for 7 days, THEN 5 tablets (25 mg total) daily with breakfast for 7 days, THEN 4 tablets (20 mg total) daily with breakfast for 7 days, THEN 3 tablets (15 mg total) daily with breakfast for 7 days, THEN 2 tablets (10 mg total) daily with breakfast for 7 days, THEN 1 tablet (5 mg total) daily with breakfast for 7 days. Start taking on: June 23, 2021 What changed:  medication strength See the new instructions. These instructions start on June 23, 2021. If you are unsure what to do until then, ask your doctor or other care provider.        Follow-up [facility]. Schedule an appointment as soon as possible for a visit.   Contact information: [address], [place], [place] [address]  Phone: [phone]               No Known Allergies  Consultations: Gastroenterology  Procedures/Studies: CT ABDOMEN PELVIS W CONTRAST  Result Date: 04/30/2021 CLINICAL DATA:  Abdominal and dull left chest pain, history of Crohn's EXAM: CT ABDOMEN AND PELVIS WITH CONTRAST TECHNIQUE: Multidetector CT imaging of the abdomen and pelvis was  performed using the standard protocol following bolus administration of intravenous contrast. CONTRAST:  144m OMNIPAQUE IOHEXOL 300 MG/ML  SOLN COMPARISON:  CT 02/05/2021, 10/12/2020 FINDINGS: Lower chest: Lung bases are clear. Normal heart size. No pericardial effusion. Hepatobiliary: Heterogeneous hepatic enhancement numerous poorly enhancing subcapsular foci throughout the liver. While this may be accentuated by contrast timing, such appearance was not present on comparison priors and could reflect intrinsic liver processes or perfusion related changes.  No other concerning focal liver abnormality. Liver surface contour remains smooth. Findings on a background of diffuse hepatic hypoattenuation. Normal gallbladder and biliary tree. Pancreas: No pancreatic ductal dilatation or surrounding inflammatory changes. Spleen: Normal in size. No concerning splenic lesions. Adrenals/Urinary Tract: Normal adrenal glands. Kidneys are normally located with symmetric enhancement. No suspicious renal lesion, urolithiasis or hydronephrosis. Urinary bladder is unremarkable for the degree of distention. Stomach/Bowel: Distal esophagus, stomach and duodenum are unremarkable. Proximal small bowel is free of acute abnormality. Suspect some mild thickening versus underdistention of the terminal ileum. Normal appendix seen in the right lower quadrant. No colonic dilatation or wall thickening. No evidence of bowel obstruction. Vascular/Lymphatic: No significant vascular findings are present. Reactive appearing low mesenteric lymph nodes. No pathologically enlarged abdominal or pelvic lymph nodes. Reproductive: Uterine displaced from towards the right pelvic sidewall, unchanged from comparison prior. No concerning adnexal masses. Other: No abdominopelvic free fluid or free gas. No bowel containing hernias. Musculoskeletal: No acute osseous abnormality or suspicious osseous lesion. IMPRESSION: 1. Heterogeneous hepatic enhancement with  poorly enhancing subcapsular foci throughout the liver, etiology is unclear. Question intrinsic liver disease versus perfusion related changes or contrast timing. Findings on a background of more diffuse hypoattenuation which could be seen in the setting of hepatic steatosis as well. Consider further characterization with serologies and if abnormal, hepatic MRI could be obtained. 2. Mild mural thickening versus underdistention of the terminal ileum. Such appearance could reflect a terminal ileitis particularly in the setting of Crohn's disease. Some reactive low mesenteric adenopathy is present as well. No organized abscess or collection or other discernible complication. These results were called by telephone at the time of interpretation on 04/30/2021 at 2:09 am to provider [facility] , who verbally acknowledged these results. Electronically Signed   By: Lovena Le M.D.   On: 04/30/2021 02:07   MR ENTERO ABDOMEN W WO CONTRAST  Result Date: 05/05/2021 CLINICAL DATA:  Rectal bleeding.  History of Crohn's disease. EXAM: MR ABDOMEN AND PELVIS WITHOUT AND WITH CONTRAST (MR ENTEROGRAPHY) TECHNIQUE: Multiplanar, multisequence MRI of the abdomen and pelvis was performed both before and during bolus administration of intravenous contrast. Negative oral contrast VoLumen was given. CONTRAST:  6.9m GADAVIST GADOBUTROL 1 MMOL/ML IV SOLN COMPARISON:  CT scan 10/12/2020 FINDINGS: COMBINED FINDINGS FOR BOTH MR ABDOMEN AND PELVIS Lower chest: The lung bases are grossly clear. No pulmonary lesions, pleural or pericardial effusion. Hepatobiliary: No hepatic lesions or intrahepatic biliary dilatation. Gallbladder is unremarkable. No common bile duct dilatation. Pancreas:  No mass, inflammation or ductal dilatation. Spleen:  Normal size.  No focal lesions. Adrenals/Urinary Tract: The adrenal glands and kidneys are unremarkable. Moderate bladder distension is noted. Stomach/Bowel: The stomach is mildly distended. No mass or  inflammatory changes. The small bowel is mildly distended. There is thickening and abnormal enhancement of the distal and terminal ileum with a very similar appearance to a prior CT scan from 10/12/2020. Findings consistent with active Crohn's disease. The colon is grossly normal. Vascular/Lymphatic: Unremarkable vascular structures. No mesenteric or retroperitoneal mass or adenopathy. Reproductive: The uterus and ovaries are unremarkable. Other:  Very small amount of free pelvic fluid, likely physiologic. Musculoskeletal: No significant bony findings. IMPRESSION: 1. Wall thickening and abnormal enhancement of the distal and terminal ileum consistent with active Crohn's disease. 2. No other significant findings. Electronically Signed   By: PMarijo SanesM.D.   On: 05/05/2021 08:28   MR ENTERO PELVIS W WO CONTRAST  Result Date: 05/05/2021 CLINICAL DATA:  Rectal bleeding.  History of Crohn's disease. EXAM: MR ABDOMEN AND PELVIS WITHOUT AND WITH CONTRAST (MR ENTEROGRAPHY) TECHNIQUE: Multiplanar, multisequence MRI of the abdomen and pelvis was performed both before and during bolus administration of intravenous contrast. Negative oral contrast VoLumen was given. CONTRAST:  6.68m GADAVIST GADOBUTROL 1 MMOL/ML IV SOLN COMPARISON:  CT scan 10/12/2020 FINDINGS: COMBINED FINDINGS FOR BOTH MR ABDOMEN AND PELVIS Lower chest: The lung bases are grossly clear. No pulmonary lesions, pleural or pericardial effusion. Hepatobiliary: No hepatic lesions or intrahepatic biliary dilatation. Gallbladder is unremarkable. No common bile duct dilatation. Pancreas:  No mass, inflammation or ductal dilatation. Spleen:  Normal size.  No focal lesions. Adrenals/Urinary Tract: The adrenal glands and kidneys are unremarkable. Moderate bladder distension is noted. Stomach/Bowel: The stomach is mildly distended. No mass or inflammatory changes. The small bowel is mildly distended. There is thickening and abnormal enhancement of the distal and  terminal ileum with a very similar appearance to a prior CT scan from 10/12/2020. Findings consistent with active Crohn's disease. The colon is grossly normal. Vascular/Lymphatic: Unremarkable vascular structures. No mesenteric or retroperitoneal mass or adenopathy. Reproductive: The uterus and ovaries are unremarkable. Other:  Very small amount of free pelvic fluid, likely physiologic. Musculoskeletal: No significant bony findings. IMPRESSION: 1. Wall thickening and abnormal enhancement of the distal and terminal ileum consistent with active Crohn's disease. 2. No other significant findings. Electronically Signed   By: PMarijo SanesM.D.   On: 05/05/2021 08:28     Subjective: Seen and examined at bedside and her abdominal pain is improved and is much better than when she presented with.  No nausea or vomiting.  Felt improved.  GI is deemed the patient stable to be discharged and she will follow-up with them and follow-up on the MRI results as well as biopsy results.  She is understanding and agreeable to plan of care and will establish with a PCP as well.   Discharge Exam: Vitals:   05/05/21 0430 05/05/21 1309  BP: 114/82 124/70  Pulse: (!) 45 69  Resp: 16 16  Temp: 97.8 F (36.6 C) 98.2 F (36.8 C)  SpO2: 100% 100%   Vitals:   05/04/21 2133 05/05/21 0035 05/05/21 0430 05/05/21 1309  BP: (!) 165/107 105/61 114/82 124/70  Pulse: (!) 44 76 (!) 45 69  Resp: 17  16 16   Temp: 97.9 F (36.6 C)  97.8 F (36.6 C) 98.2 F (36.8 C)  TempSrc:   Oral Oral  SpO2: 100%  100% 100%  Weight:      Height:       General: Pt is alert, awake, not in acute distress Cardiovascular: RRR, S1/S2 +, no rubs, no gallops Respiratory: Mildly diminished bilaterally, no wheezing, no rhonchi Abdominal: Soft, mildly tender, slightly distended, bowel sounds + Extremities: no edema, no cyanosis  The results of significant diagnostics from this hospitalization (including imaging, microbiology, ancillary and  laboratory) are listed below for reference.    Microbiology: Recent Results (from the past 240 hour(s))  Resp Panel by RT-PCR (Flu A&B, Covid) Nasopharyngeal Swab     Status: None   Collection Time: 04/30/21  2:31 AM   Specimen: Nasopharyngeal Swab; Nasopharyngeal(NP) swabs in vial transport medium  Result Value Ref Range Status   SARS Coronavirus 2 by RT PCR NEGATIVE NEGATIVE Final    Comment: (NOTE) SARS-CoV-2 target nucleic acids are NOT DETECTED.  The SARS-CoV-2 RNA is generally detectable in upper respiratory specimens during the acute phase of infection. The lowest concentration of SARS-CoV-2 viral copies this  assay can detect is 138 copies/mL. A negative result does not preclude SARS-Cov-2 infection and should not be used as the sole basis for treatment or other patient management decisions. A negative result may occur with  improper specimen collection/handling, submission of specimen other than nasopharyngeal swab, presence of viral mutation(s) within the areas targeted by this assay, and inadequate number of viral copies(<138 copies/mL). A negative result must be combined with clinical observations, patient history, and epidemiological information. The expected result is Negative.  Fact Sheet for Patients:  EntrepreneurPulse.com.au  Fact Sheet for Healthcare Providers:  IncredibleEmployment.be  This test is no t yet approved or cleared by the [place] FDA and  has been authorized for detection and/or diagnosis of SARS-CoV-2 by FDA under an Emergency Use Authorization (EUA). This EUA will remain  in effect (meaning this test can be used) for the duration of the COVID-19 declaration under Section 564(b)(1) of the Act, 21 U.S.C.section 360bbb-3(b)(1), unless the authorization is terminated  or revoked sooner.       Influenza A by PCR NEGATIVE NEGATIVE Final   Influenza B by PCR NEGATIVE NEGATIVE Final    Comment: (NOTE) The  Xpert Xpress SARS-CoV-2/FLU/RSV plus assay is intended as an aid in the diagnosis of influenza from Nasopharyngeal swab specimens and should not be used as a sole basis for treatment. Nasal washings and aspirates are unacceptable for Xpert Xpress SARS-CoV-2/FLU/RSV testing.  Fact Sheet for Patients: EntrepreneurPulse.com.au  Fact Sheet for Healthcare Providers: IncredibleEmployment.be  This test is not yet approved or cleared by the [place] FDA and has been authorized for detection and/or diagnosis of SARS-CoV-2 by FDA under an Emergency Use Authorization (EUA). This EUA will remain in effect (meaning this test can be used) for the duration of the COVID-19 declaration under Section 564(b)(1) of the Act, 21 U.S.C. section 360bbb-3(b)(1), unless the authorization is terminated or revoked.  Performed at [facility], [address] [place], [place] [address]   Calprotectin, Fecal     Status: Abnormal   Collection Time: 05/01/21  6:00 PM   Specimen: Stool  Result Value Ref Range Status   Calprotectin, Fecal 336 (H) 0 - 120 ug/g Final    Comment: (NOTE) Concentration     Interpretation   Follow-Up <16 - 50 ug/g     Normal           None >50 -120 ug/g     Borderline       Re-evaluate in 4-6 weeks    >120 ug/g     Abnormal         Repeat as clinically                                   indicated Performed At: [facility] [place], [place] [id number] Rush Farmer MD RS:[id number]     Labs: BNP (last 3 results) No results for input(s): BNP in the last 8760 hours. Basic Metabolic Panel: Recent Labs  Lab 05/01/21 0459 05/02/21 0450 05/03/21 0628 05/04/21 0441 05/05/21 0444  NA 138 138 136 136 137  K 4.6 4.5 4.4 4.0 4.3  CL 103 103 100 103 104  CO2 26 30 28 28 26   GLUCOSE 100* 123* 113* 108* 104*  BUN 12 12 11 8 10   CREATININE 0.77 0.82 0.85 0.61 0.44  CALCIUM 8.8* 9.0 9.2 8.7* 9.2  MG  --   --    --   --  2.0  PHOS  --   --   --   --  4.3   Liver Function Tests: Recent Labs  Lab 04/29/21 0032 05/01/21 0459 05/05/21 0444  AST 11* 10* 12*  ALT 8 8 14   ALKPHOS 65 59 55  BILITOT 0.2* 0.2* 0.1*  PROT 8.3* 6.7 7.2  ALBUMIN 2.8* 2.3* 2.4*   Recent Labs  Lab 04/29/21 0032  LIPASE 24   No results for input(s): AMMONIA in the last 168 hours. CBC: Recent Labs  Lab 04/29/21 0032 05/01/21 0459 05/02/21 0450 05/03/21 0628 05/04/21 0441 05/05/21 0444  WBC 7.4 5.1 7.2  --  7.3 6.9  NEUTROABS 5.3  --   --   --   --  5.4  HGB 10.9* 10.3* 10.0* 10.6* 10.6* 11.1*  HCT 34.1* 32.5* 31.6* 33.8* 34.0* 34.9*  MCV 86.5 88.1 88.3  --  87.6 87.9  PLT 437* 420* 406*  --  415* 437*   Cardiac Enzymes: No results for input(s): CKTOTAL, CKMB, CKMBINDEX, TROPONINI in the last 168 hours. BNP: Invalid input(s): POCBNP CBG: No results for input(s): GLUCAP in the last 168 hours. D-Dimer No results for input(s): DDIMER in the last 72 hours. Hgb A1c No results for input(s): HGBA1C in the last 72 hours. Lipid Profile No results for input(s): CHOL, HDL, LDLCALC, TRIG, CHOLHDL, LDLDIRECT in the last 72 hours. Thyroid function studies No results for input(s): TSH, T4TOTAL, T3FREE, THYROIDAB in the last 72 hours.  Invalid input(s): FREET3 Anemia work up No results for input(s): VITAMINB12, FOLATE, FERRITIN, TIBC, IRON, RETICCTPCT in the last 72 hours. Urinalysis    Component Value Date/Time   COLORURINE YELLOW 04/29/2021 2329   APPEARANCEUR HAZY (A) 04/29/2021 2329   LABSPEC 1.015 04/29/2021 2329   PHURINE 6.5 04/29/2021 2329   GLUCOSEU NEGATIVE 04/29/2021 2329   HGBUR NEGATIVE 04/29/2021 2329   BILIRUBINUR SMALL (A) 04/29/2021 2329   KETONESUR NEGATIVE 04/29/2021 2329   PROTEINUR NEGATIVE 04/29/2021 2329   NITRITE NEGATIVE 04/29/2021 2329   LEUKOCYTESUR NEGATIVE 04/29/2021 2329   Sepsis Labs Invalid input(s): PROCALCITONIN,  WBC,  LACTICIDVEN Microbiology Recent Results (from  the past 240 hour(s))  Resp Panel by RT-PCR (Flu A&B, Covid) Nasopharyngeal Swab     Status: None   Collection Time: 04/30/21  2:31 AM   Specimen: Nasopharyngeal Swab; Nasopharyngeal(NP) swabs in vial transport medium  Result Value Ref Range Status   SARS Coronavirus 2 by RT PCR NEGATIVE NEGATIVE Final    Comment: (NOTE) SARS-CoV-2 target nucleic acids are NOT DETECTED.  The SARS-CoV-2 RNA is generally detectable in upper respiratory specimens during the acute phase of infection. The lowest concentration of SARS-CoV-2 viral copies this assay can detect is 138 copies/mL. A negative result does not preclude SARS-Cov-2 infection and should not be used as the sole basis for treatment or other patient management decisions. A negative result may occur with  improper specimen collection/handling, submission of specimen other than nasopharyngeal swab, presence of viral mutation(s) within the areas targeted by this assay, and inadequate number of viral copies(<138 copies/mL). A negative result must be combined with clinical observations, patient history, and epidemiological information. The expected result is Negative.  Fact Sheet for Patients:  EntrepreneurPulse.com.au  Fact Sheet for Healthcare Providers:  IncredibleEmployment.be  This test is no t yet approved or cleared by the [place] FDA and  has been authorized for detection and/or diagnosis of SARS-CoV-2 by FDA under an Emergency Use Authorization (EUA). This EUA will remain  in effect (meaning this test  can be used) for the duration of the COVID-19 declaration under Section 564(b)(1) of the Act, 21 U.S.C.section 360bbb-3(b)(1), unless the authorization is terminated  or revoked sooner.       Influenza A by PCR NEGATIVE NEGATIVE Final   Influenza B by PCR NEGATIVE NEGATIVE Final    Comment: (NOTE) The Xpert Xpress SARS-CoV-2/FLU/RSV plus assay is intended as an aid in the diagnosis of  influenza from Nasopharyngeal swab specimens and should not be used as a sole basis for treatment. Nasal washings and aspirates are unacceptable for Xpert Xpress SARS-CoV-2/FLU/RSV testing.  Fact Sheet for Patients: EntrepreneurPulse.com.au  Fact Sheet for Healthcare Providers: IncredibleEmployment.be  This test is not yet approved or cleared by the [place] FDA and has been authorized for detection and/or diagnosis of SARS-CoV-2 by FDA under an Emergency Use Authorization (EUA). This EUA will remain in effect (meaning this test can be used) for the duration of the COVID-19 declaration under Section 564(b)(1) of the Act, 21 U.S.C. section 360bbb-3(b)(1), unless the authorization is terminated or revoked.  Performed at [facility], [address] [place], [place] [address]   Calprotectin, Fecal     Status: Abnormal   Collection Time: 05/01/21  6:00 PM   Specimen: Stool  Result Value Ref Range Status   Calprotectin, Fecal 336 (H) 0 - 120 ug/g Final    Comment: (NOTE) Concentration     Interpretation   Follow-Up <16 - 50 ug/g     Normal           None >50 -120 ug/g     Borderline       Re-evaluate in 4-6 weeks    >120 ug/g     Abnormal         Repeat as clinically                                   indicated Performed At: [facility] [place], [place] [id number] Rush Farmer MD YK:[id number]    Time coordinating discharge: 35 minutes  SIGNED:  Kerney Elbe, DO [facility] 05/05/2021, 7:08 PM Pager is on AMION  If 7PM-7AM, please contact night-coverage [email or web address]

## 2021-05-05 NOTE — TOC Transition Note (Signed)
Transition of Care [facility]) - CM/SW Discharge Note  Patient Details  Name: [place] Tessler MRN: [id number] Date of Birth: 1996/07/11  Transition of Care [facility]) CM/SW Contact:  Sherie Don, LCSW Phone Number: 05/05/2021, 1:40 PM  Clinical Narrative: Readmission checklist completed due to high readmission score. TOC received consult for PCP assistance. CSW met with patient who is agreeable to a referral to a local clinic in [facility] that accepts uninsured patients. CSW made referral to [facility], but the clinic will be closed this afternoon. Clinic information also added to AVS. TOC signing off.  Final next level of care: Home/Self Care Barriers to Discharge: Barriers Resolved  Patient Goals and CMS Choice Choice offered to / list presented to : NA  Discharge Plan and Services         DME Arranged: N/A DME Agency: NA  Readmission Risk Interventions Readmission Risk Prevention Plan 05/05/2021  Transportation Screening Complete  PCP or Specialist Appt within 3-5 Days Not Complete  Not Complete comments Does not have PCP, was referred to the [facility] for uninsured patients in [facility] or [place] Complete  Social Work Consult for [place] Planning/Counseling [place] Not Applicable  Medication Review [employer]) Complete  Some recent data might be hidden

## 2021-05-05 NOTE — Plan of Care (Signed)
  Problem: Activity: Goal: Risk for activity intolerance will decrease Outcome: Progressing   Problem: Nutrition: Goal: Adequate nutrition will be maintained Outcome: Progressing   Problem: Pain Managment: Goal: General experience of comfort will improve Outcome: Progressing   Problem: Safety: Goal: Ability to remain free from injury will improve Outcome: Progressing

## 2021-05-06 LAB — HEMOGLOBIN A1C
Hgb A1c MFr Bld: 5.2 % (ref 4.8–5.6)
Mean Plasma Glucose: 103 mg/dL

## 2021-05-06 NOTE — Anesthesia Postprocedure Evaluation (Signed)
Anesthesia Post Note  Patient: Jennifer Cain  Procedure(s) Performed: ESOPHAGOGASTRODUODENOSCOPY (EGD) WITH PROPOFOL COLONOSCOPY WITH PROPOFOL BIOPSY     Patient location during evaluation: Endoscopy Anesthesia Type: MAC Level of consciousness: awake Pain management: pain level controlled Vital Signs Assessment: post-procedure vital signs reviewed and stable Respiratory status: spontaneous breathing Postop Assessment: no apparent nausea or vomiting Anesthetic complications: no   No notable events documented.  Last Vitals:  Vitals:   05/05/21 0430 05/05/21 1309  BP: 114/82 124/70  Pulse: (!) 45 69  Resp: 16 16  Temp: 36.6 C 36.8 C  SpO2: 100% 100%    Last Pain:  Vitals:   05/05/21 1309  TempSrc: Oral  PainSc:                  Huston Foley

## 2021-05-09 ENCOUNTER — Telehealth: Payer: Self-pay | Admitting: General Surgery

## 2021-05-09 NOTE — Telephone Encounter (Signed)
LVM for the patient to contact the office. Want to check on her and discuss keeping her follow up appt to discuss biologic medications for tx of her chronic colitis.

## 2021-05-09 NOTE — Telephone Encounter (Signed)
-----   Message from Lavena Bullion, DO sent at 05/06/2021  3:53 PM EDT ----- Thank you for taking care of her as an inpatient.  Agree with you that if she can demonstrate compliance, time to escalate therapy to biologic.  Looks like she has a follow-up appointment already scheduled with me.  Olivia Mackie, can you please reach out to this patient to ensure that she keeps her follow-up appointment.  Thank you.  ----- Message ----- From: Thornton Park, MD Sent: 05/05/2021   5:36 PM EDT To: Lavena Bullion, DO, #  Your patient was discharged from the hospital today on oral steroids and Pentasa.  I performed her EGD and colonoscopy colonoscopy. Endoscopic appearance was normal but biopsies show chronic active colitis. She also had an MRE that show distal TI disease. Probably time for a biologic if she will comply with outpatient follow-up.  KLB ----- Message ----- From: Interface, Lab In [facility] [place] Sent: 05/05/2021   4:54 PM EDT To: Thornton Park, MD

## 2021-06-10 ENCOUNTER — Ambulatory Visit: Payer: Self-pay | Admitting: Gastroenterology

## 2021-08-19 ENCOUNTER — Encounter: Payer: Self-pay | Admitting: Emergency Medicine

## 2022-01-09 ENCOUNTER — Other Ambulatory Visit: Payer: Self-pay

## 2022-01-09 ENCOUNTER — Other Ambulatory Visit (HOSPITAL_BASED_OUTPATIENT_CLINIC_OR_DEPARTMENT_OTHER): Payer: Self-pay

## 2022-01-09 ENCOUNTER — Encounter (HOSPITAL_BASED_OUTPATIENT_CLINIC_OR_DEPARTMENT_OTHER): Payer: Self-pay | Admitting: Emergency Medicine

## 2022-01-09 ENCOUNTER — Emergency Department (HOSPITAL_BASED_OUTPATIENT_CLINIC_OR_DEPARTMENT_OTHER)
Admission: EM | Admit: 2022-01-09 | Discharge: 2022-01-09 | Disposition: A | Discharge status: Home / Self Care | Payer: Medicaid Other | Attending: Emergency Medicine | Admitting: Emergency Medicine

## 2022-01-09 DIAGNOSIS — R8271 Bacteriuria: Secondary | ICD-10-CM | POA: Insufficient documentation

## 2022-01-09 DIAGNOSIS — N9489 Other specified conditions associated with female genital organs and menstrual cycle: Secondary | ICD-10-CM | POA: Insufficient documentation

## 2022-01-09 DIAGNOSIS — Z3A01 Less than 8 weeks gestation of pregnancy: Secondary | ICD-10-CM | POA: Insufficient documentation

## 2022-01-09 DIAGNOSIS — Z3490 Encounter for supervision of normal pregnancy, unspecified, unspecified trimester: Secondary | ICD-10-CM

## 2022-01-09 DIAGNOSIS — O26891 Other specified pregnancy related conditions, first trimester: Secondary | ICD-10-CM | POA: Insufficient documentation

## 2022-01-09 LAB — CBC WITH DIFFERENTIAL/PLATELET
Abs Immature Granulocytes: 0.01 10*3/uL (ref 0.00–0.07)
Basophils Absolute: 0 10*3/uL (ref 0.0–0.1)
Basophils Relative: 1 %
Eosinophils Absolute: 0.1 10*3/uL (ref 0.0–0.5)
Eosinophils Relative: 1 %
HCT: 34 % — ABNORMAL LOW (ref 36.0–46.0)
Hemoglobin: 10.9 g/dL — ABNORMAL LOW (ref 12.0–15.0)
Immature Granulocytes: 0 %
Lymphocytes Relative: 42 %
Lymphs Abs: 2.8 10*3/uL (ref 0.7–4.0)
MCH: 26.6 pg (ref 26.0–34.0)
MCHC: 32.1 g/dL (ref 30.0–36.0)
MCV: 82.9 fL (ref 80.0–100.0)
Monocytes Absolute: 0.5 10*3/uL (ref 0.1–1.0)
Monocytes Relative: 8 %
Neutro Abs: 3.2 10*3/uL (ref 1.7–7.7)
Neutrophils Relative %: 48 %
Platelets: 318 10*3/uL (ref 150–400)
RBC: 4.1 MIL/uL (ref 3.87–5.11)
RDW: 14.5 % (ref 11.5–15.5)
WBC: 6.6 10*3/uL (ref 4.0–10.5)
nRBC: 0 % (ref 0.0–0.2)

## 2022-01-09 LAB — COMPREHENSIVE METABOLIC PANEL
ALT: 10 U/L (ref 0–44)
AST: 16 U/L (ref 15–41)
Albumin: 3.2 g/dL — ABNORMAL LOW (ref 3.5–5.0)
Alkaline Phosphatase: 52 U/L (ref 38–126)
Anion gap: 5 (ref 5–15)
BUN: 13 mg/dL (ref 6–20)
CO2: 23 mmol/L (ref 22–32)
Calcium: 8.9 mg/dL (ref 8.9–10.3)
Chloride: 105 mmol/L (ref 98–111)
Creatinine, Ser: 0.63 mg/dL (ref 0.44–1.00)
GFR, Estimated: >60 mL/min (ref >60)
Glucose, Bld: 87 mg/dL (ref 70–99)
Potassium: 4.4 mmol/L (ref 3.5–5.1)
Sodium: 133 mmol/L — ABNORMAL LOW (ref 135–145)
Total Bilirubin: 0.4 mg/dL (ref 0.3–1.2)
Total Protein: 7.7 g/dL (ref 6.5–8.1)

## 2022-01-09 LAB — URINALYSIS, MICROSCOPIC (REFLEX)

## 2022-01-09 LAB — URINALYSIS, ROUTINE W REFLEX MICROSCOPIC
Bilirubin Urine: NEGATIVE
Glucose, UA: NEGATIVE mg/dL
Hgb urine dipstick: NEGATIVE
Ketones, ur: NEGATIVE mg/dL
Nitrite: NEGATIVE
Protein, ur: NEGATIVE mg/dL
Specific Gravity, Urine: 1.025 (ref 1.005–1.030)
pH: 6 (ref 5.0–8.0)

## 2022-01-09 LAB — PREGNANCY, URINE: Preg Test, Ur: POSITIVE — AB

## 2022-01-09 LAB — HCG, QUANTITATIVE, PREGNANCY: hCG, Beta Chain, Quant, S: 3790 m[IU]/mL — ABNORMAL HIGH (ref <5)

## 2022-01-09 LAB — LIPASE, BLOOD: Lipase: 29 U/L (ref 11–51)

## 2022-01-09 MED ORDER — CEPHALEXIN 250 MG PO CAPS
500.0000 mg | ORAL_CAPSULE | Freq: Once | ORAL | Status: AC
  Administered 2022-01-09: 500 mg via ORAL
  Filled 2022-01-09: qty 2

## 2022-01-09 MED ORDER — PRENATAL COMPLETE 14-0.4 MG PO TABS
1.0000 | ORAL_TABLET | Freq: Every day | ORAL | 1 refills | Status: DC
  Filled 2022-01-09 (×2): qty 60, fill #0

## 2022-01-09 MED ORDER — CEPHALEXIN 500 MG PO CAPS
500.0000 mg | ORAL_CAPSULE | Freq: Two times a day (BID) | ORAL | 0 refills | Status: AC
  Filled 2022-01-09 (×2): qty 14, 7d supply, fill #0

## 2022-01-09 MED ORDER — PRENATAL 27-0.8 MG PO TABS
ORAL_TABLET | ORAL | 0 refills | Status: DC
  Filled 2022-01-09: qty 60, 60d supply, fill #0

## 2022-01-09 NOTE — Discharge Instructions (Addendum)
It was a pleasure caring for you today in the emergency department.  Please return to the emergency department for any worsening or worrisome symptoms.  Please follow-up with OB/GYN on Thursday 2/23

## 2022-01-09 NOTE — ED Provider Notes (Signed)
[facility] EMERGENCY DEPARTMENT Provider Note   CSN: [id number] Arrival date & time: 01/09/22  4098     History  Chief Complaint  Patient presents with   Abdominal Pain    Jennifer Cain is a 26 y.o. female.  This is a 26 y.o. female with significant medical history as below, including Crohn's disease, who presents to the ED with complaint of abdominal pain, positive pregnancy test.  Location: Suprapubic Duration: 2 to 3 weeks Onset: Gradual Timing: Intermittent Description: Aching, cramping Severity: Mild Exacerbating/Alleviating Factors: Worse with palpation Associated Symptoms: None  Pert negatives: no fevers, chills, nausea, vomiting, abnormal vaginal bleeding or discharge, no change in bowel function.  No rashes.  No suspicious oral intake.  No trauma.  Patient accompanied by family member/sister  Past Medical History: No date: Crohn disease [facility])  Past Surgical History: 07/20/2018: BIOPSY     Comment:  Procedure: BIOPSY;  Surgeon: Carol Ada, MD;                Location: [facility] ENDOSCOPY;  Service: Endoscopy;; 05/04/2021: BIOPSY     Comment:  Procedure: BIOPSY;  Surgeon: Thornton Park, MD;                Location: [facility] ENDOSCOPY;  Service: Gastroenterology;; 07/20/2018: COLONOSCOPY WITH PROPOFOL; N/A     Comment:  Procedure: COLONOSCOPY WITH PROPOFOL;  Surgeon: Carol Ada, MD;  Location: [facility] ENDOSCOPY;  Service:               Endoscopy;  Laterality: N/A; 05/04/2021: COLONOSCOPY WITH PROPOFOL; N/A     Comment:  Procedure: COLONOSCOPY WITH PROPOFOL;  Surgeon: Thornton Park, MD;  Location: [facility] ENDOSCOPY;  Service:               Gastroenterology;  Laterality: N/A; 07/20/2018: ESOPHAGOGASTRODUODENOSCOPY (EGD) WITH PROPOFOL; N/A     Comment:  Procedure: ESOPHAGOGASTRODUODENOSCOPY (EGD) WITH               PROPOFOL;  Surgeon: Carol Ada, MD;  Location: [facility]               ENDOSCOPY;  Service: Endoscopy;  Laterality:  N/A; 05/04/2021: ESOPHAGOGASTRODUODENOSCOPY (EGD) WITH PROPOFOL; N/A     Comment:  Procedure: ESOPHAGOGASTRODUODENOSCOPY (EGD) WITH               PROPOFOL;  Surgeon: Thornton Park, MD;  Location: [facility]               ENDOSCOPY;  Service: Gastroenterology;  Laterality: N/A;    The history is provided by the patient and a relative. No language interpreter was used.  Abdominal Pain Associated symptoms: no chest pain, no chills, no cough, no fever, no hematuria, no nausea, no shortness of breath and no vomiting       Home Medications Prior to Admission medications   Medication Sig Start Date End Date Taking? Authorizing Provider  cephALEXin (KEFLEX) 500 MG capsule Take 1 capsule (500 mg total) by mouth 2 (two) times daily for 7 days. 01/09/22 01/16/22 Yes Jeanell Sparrow, DO  acetaminophen (TYLENOL) 325 MG tablet Take 2 tablets (650 mg total) by mouth every 6 (six) hours as needed for up to 20 doses for mild pain (or Fever >/= 101). 05/05/21   Raiford Noble Latif, DO  ferrous sulfate 325 (65 FE) MG tablet Take 1 tablet (325 mg  total) by mouth daily with breakfast. 08/09/18   Ladell Pier, MD  ondansetron (ZOFRAN ODT) 4 MG disintegrating tablet Take 1 tablet (4 mg total) by mouth every 8 (eight) hours as needed for nausea or vomiting. 04/26/21   Tedd Sias, PA  pantoprazole (PROTONIX) 40 MG tablet Take 1 tablet (40 mg total) by mouth daily. 05/06/21 06/05/21  Kerney Elbe, DO      Allergies    Patient has no known allergies.    Review of Systems   Review of Systems  Constitutional:  Negative for chills and fever.  HENT:  Negative for facial swelling and trouble swallowing.   Eyes:  Negative for photophobia and visual disturbance.  Respiratory:  Negative for cough and shortness of breath.   Cardiovascular:  Negative for chest pain and palpitations.  Gastrointestinal:  Positive for abdominal pain. Negative for nausea and vomiting.  Endocrine: Negative for polydipsia and polyuria.   Genitourinary:  Negative for difficulty urinating and hematuria.  Musculoskeletal:  Negative for gait problem and joint swelling.  Skin:  Negative for pallor and rash.  Neurological:  Negative for syncope and headaches.  Psychiatric/Behavioral:  Negative for agitation and confusion.    Physical Exam Updated Vital Signs BP 121/79    Pulse 84    Temp 98.4 F (36.9 C) (Oral)    Resp 16    Ht 5\' 3"  (1.6 m)    Wt 74 kg    LMP 04/19/2021 Comment: neg urine preg test on admission   SpO2 100%    BMI 28.91 kg/m  Physical Exam Vitals and nursing note reviewed.  Constitutional:      General: She is not in acute distress.    Appearance: Normal appearance. She is well-developed.  HENT:     Head: Normocephalic and atraumatic.     Right Ear: External ear normal.     Left Ear: External ear normal.     Nose: Nose normal.     Mouth/Throat:     Mouth: Mucous membranes are moist.  Eyes:     General: No scleral icterus.       Right eye: No discharge.        Left eye: No discharge.  Cardiovascular:     Rate and Rhythm: Normal rate and regular rhythm.     Pulses: Normal pulses.     Heart sounds: Normal heart sounds.  Pulmonary:     Effort: Pulmonary effort is normal. No respiratory distress.     Breath sounds: Normal breath sounds.  Abdominal:     General: Abdomen is flat. Bowel sounds are normal.     Tenderness: There is no abdominal tenderness. There is no right CVA tenderness, left CVA tenderness, guarding or rebound.  Musculoskeletal:        General: Normal range of motion.     Cervical back: Normal range of motion.     Right lower leg: No edema.     Left lower leg: No edema.  Skin:    General: Skin is warm and dry.     Capillary Refill: Capillary refill takes less than 2 seconds.  Neurological:     Mental Status: She is alert.  Psychiatric:        Mood and Affect: Mood normal.        Behavior: Behavior normal.    ED Results / Procedures / Treatments   Labs (all labs ordered are  listed, but only abnormal results are displayed) Labs Reviewed  URINALYSIS, ROUTINE W  REFLEX MICROSCOPIC - Abnormal; Notable for the following components:      Result Value   Leukocytes,Ua TRACE (*)    All other components within normal limits  PREGNANCY, URINE - Abnormal; Notable for the following components:   Preg Test, Ur POSITIVE (*)    All other components within normal limits  CBC WITH DIFFERENTIAL/PLATELET - Abnormal; Notable for the following components:   Hemoglobin 10.9 (*)    HCT 34.0 (*)    All other components within normal limits  COMPREHENSIVE METABOLIC PANEL - Abnormal; Notable for the following components:   Sodium 133 (*)    Albumin 3.2 (*)    All other components within normal limits  HCG, QUANTITATIVE, PREGNANCY - Abnormal; Notable for the following components:   hCG, Beta Chain, Quant, S 3,790 (*)    All other components within normal limits  URINALYSIS, MICROSCOPIC (REFLEX) - Abnormal; Notable for the following components:   Bacteria, UA RARE (*)    All other components within normal limits  URINE CULTURE  LIPASE, BLOOD    EKG None  Radiology No results found.  Procedures Procedures    Medications Ordered in ED Medications  cephALEXin (KEFLEX) capsule 500 mg (500 mg Oral Given 01/09/22 0946)    ED Course/ Medical Decision Making/ A&P                           Medical Decision Making Amount and/or Complexity of Data Reviewed Labs: ordered.  Risk OTC drugs. Prescription drug management.   Initial Impression and Ddx CC: Abdominal pain, positive pregnancy test  This patient presents to the Emergency Department for the above complaint. This involves an extensive number of treatment options and is a complaint that carries with it a high risk of complications and morbidity. Vital signs were reviewed. Serious etiologies considered.  Patient PMH that increases complexity of ED encounter: Crohn's disease  Record review: Previous records  obtained and reviewed   Additional history obtained from sister at bedside  Medical and surgical history as noted above.    Interpretation of Diagnostics Lab results that were available during my care of the patient were reviewed by me and considered in my medical decision making.    I personally reviewed the Cardiac Monitor and my interpretation is as follows:  NSR    Social determinants of health include -lack of insurance, medication noncompliance  Patient Reassessment and Ultimate Disposition/Management Patient found to have bacteria, will treat with Keflex given positive pregnancy test.  Beta quant 3700.  LMP was approximately 3 months ago.  No vaginal bleeding.  No contraction-like sensations, no cramping.  Labs stable otherwise.  Discussed with social work, patient to have OB/GYN appointment on Thursday.  PCP appointment as well.  Start on Keflex, prenatal.  Discussed prenatal care with patient  at bedside.  Advised patient not to take mesalamine as she is pregnant is contraindicated.  Advised her to follow-up with OB/GYN regarding medication management, may require repeat evaluation by gastroenterology regarding Crohn's disease management.  Discontinue mesalamine and dicyclomine  Repeat abdominal exam is soft, nontender, nonperitoneal.  She is tolerant p.o. intake.  Hemodynamically stable.  Labs stable.  No abdominal pain.  Ambulatory with a steady gait.  Patient management required discussion with the following services or consulting groups:  Case Management/Social Work  Complexity of Problems Addressed Acute complicated illness or Injury  Additional Data Reviewed and Analyzed Further history obtained from: Further history from spouse/family member, Past medical  history and medications listed in the EMR, Prior ED visit notes, Recent Consult notes, and Prior labs/imaging results  Patient Encounter Risk Assessment Prescriptions   The patient improved significantly and was  discharged in stable condition. Detailed discussions were had with the patient regarding current findings, and need for close f/u with PCP or on call doctor. The patient has been instructed to return immediately if the symptoms worsen in any way for re-evaluation. Patient verbalized understanding and is in agreement with current care plan. All questions answered prior to discharge.     This chart was dictated using voice recognition software.  Despite best efforts to proofread,  errors can occur which can change the documentation meaning.         Final Clinical Impression(s) / ED Diagnoses Final diagnoses:  Bacteriuria in pregnancy  Pregnancy, unspecified gestational age    Rx / DC Orders ED Discharge Orders          Ordered    cephALEXin (KEFLEX) 500 MG capsule  2 times daily        01/09/22 1026    Prenatal Vit-Fe Fumarate-FA (PRENATAL COMPLETE) 14-0.4 MG TABS  Daily,   Status:  Discontinued        01/09/22 [place], Takima Encina A, DO 01/09/22 1036

## 2022-01-09 NOTE — Discharge Planning (Signed)
°  [place] Medication Assistance Card Name: [place] Adams ID (MRN): [id number] [place]: [id number] RX Group: [id number] Discharge Date: 01/09/2022 Expiration Date:01/17/2022                                           (must be filled within 7 days of discharge)     You have been approved to have the prescriptions written by your discharging physician filled through our [facility] (Medication Assistance Through [facility]) program. This program allows for a one-time (no refills) 34-day supply of selected medications for a low copay amount.  The copay is $3.00 per prescription. For instance, if you have one prescription, you will pay $3.00; for two prescriptions, you pay $6.00; for three prescriptions, you pay $9.00; and so on.  Only certain pharmacies are participating in this program with [facility]. You will need to select one of the pharmacies from the attached list and take your prescriptions, this letter, and your photo ID to one of the [place] pharmacies, [facility], [facility] [address], or [facility] [address].   We are excited that you are able to use the [facility] program to get your medications. These prescriptions must be filled within 7 days of hospital discharge or they will no longer be valid for the [facility] program. Should you have any problems with your prescriptions please contact your case management team member at [phone] for Gershon Mussel [place]/ [facility].  Thank you, [place] Management

## 2022-01-09 NOTE — Progress Notes (Signed)
°   01/09/22 0916  TOC ED Mini Assessment  TOC Time spent with patient (minutes): 45  PING Used in TOC Assessment No  Admission or Readmission Diverted Yes  Interventions which prevented an admission or readmission Medication Review;Follow-up medical appointment  What brought you to the Emergency Department?  Positive home pregnancy test and has not taken chron's Rx in months  Barriers to Discharge ED Uninsured needing medication assistance;ED Uninsured needing PCP establishment;Continued Medical Work up  [employer] Enrolled in [employer] program, obtained OB and PCP appointments  Means of departure Car  Patient states their goals for this hospitalization and ongoing recovery are: Get some help   Jennifer Cain J. Clydene Laming, [place], [place], [place]  RNCM set up appointment with Mora Bellman [facility]) on 2/23 @0955 .  Bedside RN spoke with pt at bedside and advised to please arrive 15 min early and take a picture ID and your current medications.

## 2022-01-09 NOTE — ED Triage Notes (Signed)
Abd pain x a few weeks . Took a home preg test and it was positive   LMP  Nov . Has not seen a dr denies vag d/c at this time ,  denies dysuria

## 2022-01-10 ENCOUNTER — Other Ambulatory Visit (HOSPITAL_BASED_OUTPATIENT_CLINIC_OR_DEPARTMENT_OTHER): Payer: Self-pay

## 2022-01-10 LAB — URINE CULTURE: Culture: NO GROWTH

## 2022-01-12 ENCOUNTER — Encounter: Payer: Self-pay | Admitting: Obstetrics and Gynecology

## 2022-01-13 ENCOUNTER — Ambulatory Visit: Payer: Self-pay | Admitting: Medical

## 2022-01-17 ENCOUNTER — Ambulatory Visit: Payer: Self-pay | Admitting: Medical

## 2022-01-18 ENCOUNTER — Ambulatory Visit: Payer: Medicaid Other | Admitting: Medical

## 2022-01-31 ENCOUNTER — Telehealth (INDEPENDENT_AMBULATORY_CARE_PROVIDER_SITE_OTHER): Payer: Self-pay

## 2022-01-31 DIAGNOSIS — Z3A00 Weeks of gestation of pregnancy not specified: Secondary | ICD-10-CM

## 2022-01-31 DIAGNOSIS — Z3480 Encounter for supervision of other normal pregnancy, unspecified trimester: Secondary | ICD-10-CM | POA: Insufficient documentation

## 2022-01-31 MED ORDER — BLOOD PRESSURE MONITORING DEVI: 1.0000 | 0 refills | Status: DC

## 2022-01-31 NOTE — Progress Notes (Signed)
New OB Intake ? ?I connected with  Jennifer Cain on 01/31/22 at 10:15 AM EDT by MyChart Video Visit and verified that I am speaking with the correct person using two identifiers. Nurse is located at [facility] and pt is located at [facility]. ? ?I discussed the limitations, risks, security and privacy concerns of performing an evaluation and management service by telephone and the availability of in person appointments. I also discussed with the patient that there may be a patient responsible charge related to this service. The patient expressed understanding and agreed to proceed. ? ?I explained I am completing New OB Intake today. We discussed her EDD of 08/23/22 that is based on LMP of 11/16/21. Pt is G1/P0. I reviewed her allergies, medications, Medical/Surgical/OB history, and appropriate screenings. I informed her of [facility] services. Based on history, this is a/an  pregnancy uncomplicated .  ? ?Patient Active Problem List  ? Diagnosis Date Noted  ? Anemia due to GI blood loss   ? Ileitis 04/30/2021  ? Colitis 08/09/2018  ? Crohn's disease of small intestine ([place]) 07/24/2018  ? Failure to thrive in adult   ? Hypomagnesemia 07/20/2018  ? Normocytic anemia 07/19/2018  ? Generalized weakness 07/19/2018  ? Thrombocytosis 07/19/2018  ? Hypokalemia 07/19/2018  ? Malnutrition of moderate degree 07/19/2018  ? Enteritis-Chronic 07/18/2018  ? FTT (failure to thrive) in adult 07/18/2018  ? Protein calorie malnutrition ([place]) 07/18/2018  ? Chronic diarrhea 07/18/2018  ? Nausea & vomiting-chronic 07/18/2018  ? ? ?Concerns addressed today ? ?Delivery Plans:  ?Plans to deliver at [facility].  ? ?Waterbirth candidate?  ? ?MyChart/Babyscripts ?MyChart access verified. I explained pt will have some visits in office and some virtually. Babyscripts instructions given and order placed. Patient verifies receipt of registration text/e-mail. Account successfully created and app downloaded. ? ?Blood Pressure Cuff  ?Blood pressure cuff ordered for  patient to pick-up from [employer]. Explained after first prenatal appt pt will check weekly and document in 42. ? ?Weight scale: Patient does / does not  have weight scale. Weight scale ordered for patient to pick up from [employer].  ? ?Anatomy US ?Explained first scheduled [place] will be around 19 weeks. Anatomy US scheduled for 03/29/22 at 0945. Pt notified to arrive at 0930. ?Scheduled AFP lab only appointment if CenteringPregnancy pt for same day as anatomy US.  ? ?Labs ?Discussed Johnsie Cancel genetic screening with patient. Would like both Panorama and Horizon drawn at new OB visit.Also if interested in genetic testing, tell patient she will need AFP 15-21 weeks to complete genetic testing .Routine prenatal labs needed. ? ?Covid Vaccine ?Patient has not covid vaccine.  ? ?Is patient a CenteringPregnancy candidate? Declined  ? ?Is patient a Mom+Baby Combined Care candidate? Accepted    ? ?Informed patient of [facility] website  and placed link in her AVS.  ? ?Social Determinants of Health ?Food Insecurity: Patient denies food insecurity. ?WIC Referral: Patient is interested in referral to [facility].  ?Transportation: Patient denies transportation needs. ?Childcare: Discussed no children allowed at ultrasound appointments. Offered childcare services; patient declines childcare services at this time. ? ?Send link to Pregnancy Navigators ? ? ?Placed OB Box on problem list and updated ? ?First visit review ?I reviewed new OB appt with pt. I explained she will have a pelvic exam, ob bloodwork with genetic screening, and PAP smear. Explained pt will be seen by Dr. Dione Plover at first visit; encounter routed to appropriate provider. Explained that patient will be seen by pregnancy  navigator following visit with provider. [facility] information placed in AVS.  ? ?Bethanne Ginger, CMA ?01/31/2022  10:49 AM  ?

## 2022-01-31 NOTE — Patient Instructions (Signed)
AREA PEDIATRIC/FAMILY PRACTICE PHYSICIANS ? ?[place] [phone]) ?[place] ?Erin Hearing, MD; Gwendlyn Deutscher, MD; Walker Kehr, MD; Andria Frames, MD; McDiarmid, MD; Dutch Quint, MD; Nori Riis, MD; Mingo Amber, MD ?[address] [place], [place] [address] ?([phone] ?Mon-Fri 8:30-12:30, 1:30-5:00 ?Providers come to see babies at [facility] ?Accepting Medicaid ?[place] at [facility] ?Limited providers who accept newborns: Dorthy Cooler, MD; Orland Mustard, MD; Stephanie Acre, MD ?[address] [place], [place], [place] [address] ?([phone] ?Mon-Fri 8:00-5:30 ?Babies seen by providers at [facility] ?Does NOT accept Medicaid ?Please call early in hospitalization for appointment (limited availability)  ?[facility] [employer] ?Amil Amen, MD ?[address], [place], [place] [address] ?[phone] ?Mon, Tue, Thur, Fri 8:30-5:00, Wed 10:00-7:00 (closed 1-2pm) ?Babies seen by [facility] providers ?Accepting Medicaid ?Truddie Coco - Pediatrician ?Truddie Coco, MD ?[place], [place], [place] [address] ?([phone] ?Mon-Fri 8:30-5:00, Sat 8:30-12:00 ?Provider comes to see babies at [facility] ?Accepting Medicaid ?Must have been referred from current patients or contacted office prior to delivery ?[place] for [facility] for [place]) ?Owens Shark, MD; Tamera Punt, MD; Doneen Poisson, MD; Fatima Sanger, MD; Wynetta Emery, MD; Jess Barters, MD; Tami Ribas, MD; Herbert Moors, MD; Derrell Lolling, MD; Dorothyann Peng, MD; Lucious Groves, NP; Baldo Ash, NP ?[address], [place], [place] [address] ?([phone] ?Mon, Tue, Thur, Fri 8:30-5:30, Wed 9:30-5:30, Sat 8:30-12:30 ?Babies seen by [facility] providers ?Accepting Medicaid ?Only accepting infants of first-time parents or siblings of current patients ?Hospital discharge coordinator will make follow-up appointment ?Baltazar Najjar ?[address], [place], [place]  [address] ?[phone]   Fax - [phone] ?[facility] ?[place].  [address], [place], [place]  [address] ?Phone - [phone]   Fax - [phone] ?Shilpa Gosrani ?[address] [place], [place], [place]  [address] ?[phone] ? ?[place] [phone]) ?[place] ?Redmond Baseman, MD; Jacklynn Ganong, MD; Torrie Mayers, MD; MD; Rosana Hoes, MD; Servando Salina, MD; Rose Fillers, MD; Rex Kras, MD; Corinna Capra, MD; Volney American, MD; Trilby Drummer, MD; Janann Colonel, MD; Jimmye Norman, MD ?[address], [place], [place] [address] ?[phone] ?Mon-Fri 8:30-5:00 (extended evenings Mon-Thur as needed), Sat-Sun 10:00-1:00 ?Providers come to see babies at [facility] ?Accepting Medicaid for families of first-time babies and families with all children in the household age 50 and under. Must register with office prior to making appointment (M-F only). ?[place] ?Raenette Rover, NP; Tomi Bamberger, MD; Redmond School, MD; [place], [place] ?[address] [place], [place] [address] ?[phone] ?Mon-Fri 8:00-5:00 ?Babies seen by providers at [facility] ?Does NOT accept Medicaid/Commercial Insurance Only ?[facility] at [facility]  ?Sabino Gasser, MD; Drema Dallas, MD; Montine Circle, MD; Vilma Prader, MD; Vanita Panda, MD; Alfonso Ramus, MD; Ruthann Cancer, MD; Roxanne Mins, MD; Rosalva Ferron, MD; Polly Cobia, MD ?[address] [place], [place] [address] ?[phone] ?Mon-Fri 8:30-5:30, Sat (Oct.-Mar.) 9:00-1:00 ?Babies seen by providers at [facility] ?Accepting Medicaid ? ?[place] [phone]) ?[facility] ?Joneen Caraway, MD; Suzan Slick, MD ?[place], [place], [place] [address] ?[phone] ?Mon-Fri 8:30-5:00, Sat 8:30-12:00 ?Providers come to see babies at [facility] ?Does NOT accept Medicaid ?[place] at [facility] ?Deneise Lever, [place]; Steinauer, MD; [place], [place]; Nancy Fetter, MD; Moreen Fowler, MD ?[address], [place],  [address] ?[phone] ?Mon-Fri 8:00-5:00 ?Babies seen by providers at [facility] ?Does NOT accept Medicaid ?Only accepting babies of parents who  are patients ?Please call early in hospitalization for appointment (limited availability) ?[facility] ?Carlis Abbott, MD; Sharlene Motts, MD; Rod Can, MD; Warner Mccreedy, NP; Sabra Heck, MD; Ermalinda Memos, MD; Sharlett Iles, NP; Aurther Loft, MD; Jerrye Beavers, MD; Marcello Moores, MD; Berline Lopes, MD; Charolette Forward, MD ?[phone]  [employer] [place], [place], [place] [address] ?[phone] ?Mon-Fri 8:00-5:00, Sat 9:00-12:00 ?Providers come to see babies at [facility] ?Does NOT accept Medicaid ? ?[facility] [phone]) ?[place] at [facility] ?Limited providers accepting new patients: Dayna Ramus, NP; [place], [place] ?[address] [place], [place] [address] ?[phone] ?Mon-Fri 8:00-5:00 ?Babies seen by providers at [facility] ?Does NOT accept Medicaid ?Only accepting babies of parents who are patients ?Please call early in hospitalization for appointment (limited availability) ?[facility] ?Abner Greenspan, MD; Sheran Lawless, MD ?[address] [place], [place] [address] ?([phone] (press [phone] to schedule appointment) ?Mon-Fri 8:00-5:00 ?Providers come to see babies at [facility] ?Does NOT accept Medicaid ?[facility] ?Mazer, MD ?[address] [place], [place] [address] ?([phone] ?Mon-Fri 8:30-5:00 (lunch 12:30-1:00), extended hours by appointment only Wed 5:00-6:30 ?Babies seen by [facility] providers ?Accepting Medicaid ?Therapist, music at [place] ?Volanda Napoleon, MD; [place], MD; Ethlyn Gallery, MD ?[place], [place], [place] [address] ?[phone] ?Mon-Fri 8:00-5:00 ?Babies seen by [facility] providers ?Does NOT accept Medicaid ?Therapist, music at [employer] ?Jerline Pain, MD; Yong Channel, MD; Howard, DO ?[address] [place], [place] [address] ?[phone] ?Mon-Fri 8:00-5:00 ?Babies seen by [facility] providers ?Does NOT accept Medicaid ?[facility] ?Erlene Quan, [place]; [place], [place]; [place], NP; Albertina Parr, MD; [place] Jericho, MD; Ronney Lion, MD; Carlos Levering, NP; Jerelene Redden, NP; Tomasita Crumble, NP; Ronelle Nigh, NP; Corinna Lines, MD; Karsten Ro,  MD ?[address] [place], [place] [address] ?([phone] ?Mon-Fri 8:30-5:00, Sat 10:00-1:00 ?Providers come to see babies at [facility] ?Does NOT accept Medicaid ?Free prenatal information session Tuesdays at 4:45pm ?[facility] ?Coletta Memos, MD; Araceli Bouche, [place]; [place], [place]; [place], [place] ?[address] [place] [address] ?[phone] ?Mon-Fri 7:30-5:30 ?Babies seen by [facility] providers ?[facility] ?[address] [place], [place], [place]  [address] ?[phone]   Fax - [phone] ? ?[place] [phone] & [phone]) ?[place] ?Ayesha Rumpf, MD ?[address] [place], [place] [address] ?[phone] ?Mon-Thur 8:00-6:00 ?Providers come to see babies at [facility] ?Accepting Medicaid ?[place] ?[place]uida Sills, NP; Melford Aase, MD; [place], [place]; [place], [place] ?[address] [place], [place] [address] ?[phone] ?Mon-Thur 7:30-7:30, Fri 7:30-4:30 ?Babies seen by [facility] providers ?Accepting Medicaid ?[facility] ?Carolynn Sayers, MD; Cristino Martes, NP; Gertie Baron, MD ?[place], [place], [place] [address] ?([phone] ?Mon-Fri 8:30-5:00, Sat 8:30-12:00 ?Providers come to see babies at [facility] ?Accepting Medicaid ?Must have ?Meet & Greet? appointment at office prior to delivery ?[place] ([place]) ?Faythe Dingwall, MD; Juleen China, MD; Clydene Laming, MD ?[address], [place], [place] [address] ?[phone] ?Mon-Wed 8:00-6:00, Thur-Fri 8:00-5:00, Sat 9:00-12:00 ?Providers come to see babies at [facility] ?Does NOT accept Medicaid ?Only accepting siblings of current patients ?[facility]  ?[address], [place], [place], [place]  [address] ?[phone]   Fax - [phone] ?[place] at [facility] ?[place] [address], [place], [place]  [address] ?[phone]   Fax -  [phone] ? ?[place] [phone] & [phone]) ?Therapist, music at [employer] ?Harlin Heys, DO ?[address], [place], [place] [address] ?[phone] ?Mon-Fri 7:00-5:00 ?Babies seen by [facility]

## 2022-02-08 ENCOUNTER — Other Ambulatory Visit: Payer: Self-pay

## 2022-02-08 ENCOUNTER — Other Ambulatory Visit (HOSPITAL_COMMUNITY)
Admission: RE | Admit: 2022-02-08 | Discharge: 2022-02-08 | Disposition: A | Discharge status: Home / Self Care | Payer: Medicaid Other | Source: Ambulatory Visit | Attending: Family Medicine | Admitting: Family Medicine

## 2022-02-08 ENCOUNTER — Encounter: Payer: Self-pay | Admitting: Family Medicine

## 2022-02-08 ENCOUNTER — Ambulatory Visit (INDEPENDENT_AMBULATORY_CARE_PROVIDER_SITE_OTHER): Payer: Self-pay | Admitting: Family Medicine

## 2022-02-08 VITALS — BP 131/89 | HR 92 | Ht 63.0 in | Wt 158.7 lb

## 2022-02-08 DIAGNOSIS — K5000 Crohn's disease of small intestine without complications: Secondary | ICD-10-CM

## 2022-02-08 DIAGNOSIS — Z113 Encounter for screening for infections with a predominantly sexual mode of transmission: Secondary | ICD-10-CM

## 2022-02-08 DIAGNOSIS — Z3480 Encounter for supervision of other normal pregnancy, unspecified trimester: Secondary | ICD-10-CM

## 2022-02-08 DIAGNOSIS — Z124 Encounter for screening for malignant neoplasm of cervix: Secondary | ICD-10-CM

## 2022-02-08 MED ORDER — ASPIRIN EC 81 MG PO TBEC: 81.0000 mg | DELAYED_RELEASE_TABLET | Freq: Every day | ORAL | 11 refills | Status: DC

## 2022-02-08 NOTE — Patient Instructions (Signed)
Second Trimester of Pregnancy ?The second trimester of pregnancy is from week 13 through week 27. This is months 4 through 6 of pregnancy. The second trimester is often a time when you feel your best. Your body has adjusted to being pregnant, and you begin to feel better physically. ?During the second trimester: ?Morning sickness has lessened or stopped completely. ?You may have more energy. ?You may have an increase in appetite. ?The second trimester is also a time when the unborn baby (fetus) is growing rapidly. At the end of the sixth month, the fetus may be up to 12 inches long and weigh about 1? pounds. You will likely begin to feel the baby move (quickening) between 16 and 20 weeks of pregnancy. ?Body changes during your second trimester ?Your body continues to go through many changes during your second trimester. The changes vary and generally return to normal after the baby is born. ?Physical changes ?Your weight will continue to increase. You will notice your lower abdomen bulging out. ?You may begin to get stretch marks on your hips, abdomen, and breasts. ?Your breasts will continue to grow and to become tender. ?Dark spots or blotches (chloasma or mask of pregnancy) may develop on your face. ?A dark line from your belly button to the pubic area (linea nigra) may appear. ?You may have changes in your hair. These can include thickening of your hair, rapid growth, and changes in texture. Some people also have hair loss during or after pregnancy, or hair that feels dry or thin. ?Health changes ?You may develop headaches. ?You may have heartburn. ?You may develop constipation. ?You may develop hemorrhoids or swollen, bulging veins (varicose veins). ?Your gums may bleed and may be sensitive to brushing and flossing. ?You may urinate more often because the fetus is pressing on your bladder. ?You may have back pain. This is caused by: ?Weight gain. ?Pregnancy hormones that are relaxing the joints in your  pelvis. ?A shift in weight and the muscles that support your balance. ?Follow these instructions at home: ?Medicines ?Follow your health care provider's instructions regarding medicine use. Specific medicines may be either safe or unsafe to take during pregnancy. Do not take any medicines unless approved by your health care provider. ?Take a prenatal vitamin that contains at least 600 micrograms (mcg) of folic acid. ?Eating and drinking ?Eat a healthy diet that includes fresh fruits and vegetables, whole grains, good sources of protein such as meat, eggs, or tofu, and low-fat dairy products. ?Avoid raw meat and unpasteurized juice, milk, and cheese. These carry germs that can harm you and your baby. ?You may need to take these actions to prevent or treat constipation: ?Drink enough fluid to keep your urine pale yellow. ?Eat foods that are high in fiber, such as beans, whole grains, and fresh fruits and vegetables. ?Limit foods that are high in fat and processed sugars, such as fried or sweet foods. ?Activity ?Exercise only as directed by your health care provider. Most people can continue their usual exercise routine during pregnancy. Try to exercise for 30 minutes at least 5 days a week. Stop exercising if you develop contractions in your uterus. ?Stop exercising if you develop pain or cramping in the lower abdomen or lower back. ?Avoid exercising if it is very hot or humid or if you are at a high altitude. ?Avoid heavy lifting. ?If you choose to, you may have sex unless your health care provider tells you not to. ?Relieving pain and discomfort ?Wear a supportive bra  to prevent discomfort from breast tenderness. ?Take warm sitz baths to soothe any pain or discomfort caused by hemorrhoids. Use hemorrhoid cream if your health care provider approves. ?Rest with your legs raised (elevated) if you have leg cramps or low back pain. ?If you develop varicose veins: ?Wear support hose as told by your health care  provider. ?Elevate your feet for 15 minutes, 3-4 times a day. ?Limit salt in your diet. ?Safety ?Wear your seat belt at all times when driving or riding in a car. ?Talk with your health care provider if someone is verbally or physically abusive to you. ?Lifestyle ?Do not use hot tubs, steam rooms, or saunas. ?Do not douche. Do not use tampons or scented sanitary pads. ?Avoid cat litter boxes and soil used by cats. These carry germs that can cause birth defects in the baby and possibly loss of the fetus by miscarriage or stillbirth. ?Do not use herbal remedies, alcohol, illegal drugs, or medicines that are not approved by your health care provider. Chemicals in these products can harm your baby. ?Do not use any products that contain nicotine or tobacco, such as cigarettes, e-cigarettes, and chewing tobacco. If you need help quitting, ask your health care provider. ?General instructions ?During a routine prenatal visit, your health care provider will do a physical exam and other tests. He or she will also discuss your overall health. Keep all follow-up visits. This is important. ?Ask your health care provider for a referral to a local prenatal education class. ?Ask for help if you have counseling or nutritional needs during pregnancy. Your health care provider can offer advice or refer you to specialists for help with various needs. ?Where to find more information ?American Pregnancy Association: americanpregnancy.org ?[employer] of [employer]stetricians and Gynecologists: PoolDevices.com.pt ?Office on Women's Health: KeywordPortfolios.com.br ?Contact a health care provider if you have: ?A headache that does not go away when you take medicine. ?Vision changes or you see spots in front of your eyes. ?Mild pelvic cramps, pelvic pressure, or nagging pain in the abdominal area. ?Persistent nausea, vomiting, or diarrhea. ?A bad-smelling vaginal discharge or foul-smelling urine. ?Pain when you  urinate. ?Sudden or extreme swelling of your face, hands, ankles, feet, or legs. ?A fever. ?Get help right away if you: ?Have fluid leaking from your vagina. ?Have spotting or bleeding from your vagina. ?Have severe abdominal cramping or pain. ?Have difficulty breathing. ?Have chest pain. ?Have fainting spells. ?Have not felt your baby move for the time period told by your health care provider. ?Have new or increased pain, swelling, or redness in an arm or leg. ?Summary ?The second trimester of pregnancy is from week 13 through week 27 (months 4 through 6). ?Do not use herbal remedies, alcohol, illegal drugs, or medicines that are not approved by your health care provider. Chemicals in these products can harm your baby. ?Exercise only as directed by your health care provider. Most people can continue their usual exercise routine during pregnancy. ?Keep all follow-up visits. This is important. ?This information is not intended to replace advice given to you by your health care provider. Make sure you discuss any questions you have with your health care provider. ?Document Revised: 04/14/2020 Document Reviewed: 02/19/2020 ?Elsevier Patient Education ? [employer]. ? ?Contraception Choices ?Contraception, also called birth control, refers to methods or devices that prevent pregnancy. ?Hormonal methods ?Contraceptive implant ?A contraceptive implant is a thin, plastic tube that contains a hormone that prevents pregnancy. It is different from an intrauterine device (IUD). It  is inserted into the upper part of the arm by a health care provider. Implants can be effective for up to 3 years. ?Progestin-only injections ?Progestin-only injections are injections of progestin, a synthetic form of the hormone progesterone. They are given every 3 months by a health care provider. ?Birth control pills ?Birth control pills are pills that contain hormones that prevent pregnancy. They must be taken once a day, preferably at the  same time each day. A prescription is needed to use this method of contraception. ?Birth control patch ?The birth control patch contains hormones that prevent pregnancy. It is placed on the skin and must be changed once a week for

## 2022-02-08 NOTE — Progress Notes (Signed)
? ?  ? ?Subjective:  ? ?Jennifer Cain is a 26 y.o. G1P0000 at 66w0dby LMP being seen today for her first obstetrical visit.  Her obstetrical history is significant for  Crohn's disease . Patient does intend to breast feed. Pregnancy history fully reviewed. ? ?Patient reports no complaints. ? ?HISTORY: ?OB History  ?Gravida Para Term Preterm AB Living  ?1 0 0 0 0 0  ?SAB IAB Ectopic Multiple Live Births  ?0 0 0 0 0  ?  ?# Outcome Date GA Lbr Len/2nd Weight Sex Delivery Anes PTL Lv  ?1 Current           ?  ? ?Last pap smear: ?No results found for: DIAGPAP, HPV, HPVHIGH ?*needs* ? ?Past Medical History:  ?Diagnosis Date  ? Cro[place] disease ([place]   ? ?Past Surgical History:  ?Procedure Laterality Date  ? BIOPSY  07/20/2018  ? Procedure: BIOPSY;  Surgeon: HCarol Ada MD;  [facility];  Service: Endoscopy;;  ? BIOPSY  05/04/2021  ? Procedure: BIOPSY;  Surgeon: BThornton Park MD;  [facility];  Service: Gastroenterology;;  ? COLONOSCOPY WITH PROPOFOL N/A 07/20/2018  ? Procedure: COLONOSCOPY WITH PROPOFOL;  Surgeon: HCarol Ada MD;  [facility];  Service: Endoscopy;  Laterality: N/A;  ? COLONOSCOPY WITH PROPOFOL N/A 05/04/2021  ? Procedure: COLONOSCOPY WITH PROPOFOL;  Surgeon: BThornton Park MD;  [facility];  Service: Gastroenterology;  Laterality: N/A;  ? ESOPHAGOGASTRODUODENOSCOPY (EGD) WITH PROPOFOL N/A 07/20/2018  ? Procedure: ESOPHAGOGASTRODUODENOSCOPY (EGD) WITH PROPOFOL;  Surgeon: HCarol Ada MD;  [facility];  Service: Endoscopy;  Laterality: N/A;  ? ESOPHAGOGASTRODUODENOSCOPY (EGD) WITH PROPOFOL N/A 05/04/2021  ? Procedure: ESOPHAGOGASTRODUODENOSCOPY (EGD) WITH PROPOFOL;  Surgeon: BThornton Park MD;  [facility];  Service: Gastroenterology;  Laterality: N/A;  ? ?Family History  ?Problem Relation Age of Onset  ? GI Disease Neg Hx   ? Autoimmune disease Neg Hx   ? ?Social History  ? ?Tobacco Use  ? Smoking status: Never  ?  Passive  exposure: Never  ? Smokeless tobacco: Never  ?Vaping Use  ? Vaping Use: Never used  ?Substance Use Topics  ? Alcohol use: Never  ? Drug use: Never  ? ?No Known Allergies ?Current Outpatient Medications on File Prior to Visit  ?Medication Sig Dispense Refill  ? acetaminophen (TYLENOL) 325 MG tablet Take 2 tablets (650 mg total) by mouth every 6 (six) hours as needed for up to 20 doses for mild pain (or Fever >/= 101). 20 tablet 0  ? Blood Pressure Monitoring DEVI 1 each by Does not apply route once a week. 1 each 0  ? ferrous sulfate 325 (65 FE) MG tablet Take 1 tablet (325 mg total) by mouth daily with breakfast. 100 tablet 1  ? ondansetron (ZOFRAN ODT) 4 MG disintegrating tablet Take 1 tablet (4 mg total) by mouth every 8 (eight) hours as needed for nausea or vomiting. 20 tablet 0  ? Prenatal Vit-Fe Fumarate-FA (MULTIVITAMIN-PRENATAL) 27-0.8 MG TABS tablet Take 1 tablet by mouth daily 60 tablet 0  ? ?No current facility-administered medications on file prior to visit.  ? ? ? ?Exam  ? ?Vitals:  ? 02/08/22 1016  ?BP: 131/89  ?Pulse: 92  ?Weight: 158 lb 11.2 oz (72 kg)  ?Height: 5' 3"  (1.6 m)  ? ?Fetal Heart Rate (bpm): 150 ? ?Uterus:     ?Pelvic Exam: Perineum: no hemorrhoids, normal perineum  ? Vulva: normal external genitalia, no lesions  ? Vagina:  normal mucosa mild  amount of white discharge but denies any symptoms  ? Cervix: no lesions and normal, pap smear done.   ?System: General: well-developed, well-nourished female in no acute distress  ? Skin: normal coloration and turgor, no rashes  ? Neurologic: oriented, normal, negative, normal mood  ? Extremities: normal strength, tone, and muscle mass, ROM of all joints is normal  ? HEENT PERRLA, extraocular movement intact and sclera clear, anicteric  ? Neck supple and no masses  ? Respiratory:  no respiratory distress  ? ? ?  ?Assessment:  ? ?Pregnancy: G1P0000 ?Patient Active Problem List  ? Diagnosis Date Noted  ? Supervision of other normal pregnancy, antepartum  01/31/2022  ? Anemia due to GI blood loss   ? Ileitis 04/30/2021  ? Colitis 08/09/2018  ? Crohn's disease of small intestine ([place]) 07/24/2018  ? Failure to thrive in adult   ? Hypomagnesemia 07/20/2018  ? Normocytic anemia 07/19/2018  ? Generalized weakness 07/19/2018  ? Thrombocytosis 07/19/2018  ? Hypokalemia 07/19/2018  ? Malnutrition of moderate degree 07/19/2018  ? Enteritis-Chronic 07/18/2018  ? FTT (failure to thrive) in adult 07/18/2018  ? Protein calorie malnutrition ([place]) 07/18/2018  ? Chronic diarrhea 07/18/2018  ? Nausea & vomiting-chronic 07/18/2018  ? ?  ?Plan:  ?1. Supervision of other normal pregnancy, antepartum ?BP and FHR normal ?Pap collected today ?Start prenatal ASA ?Initial labs drawn. ?Continue prenatal vitamins. ?Genetic Screening discussed, NIPS: ordered. ?Ultrasound discussed; fetal anatomic survey: ordered. ?Problem list reviewed and updated. ?The nature of [facility] clinic was explained to patient; Voiced they may need to be seen by other [facility] providers which includes family medicine physicians, OB GYNs, and APPs. Delivery will hopefully be with one of the [facility] providers or another [facility] Medicine physician and we cannot promise this at this time.  Discussed there are [facility] staff in the hospital 24-7 and they understand and support this model and there is a likelihood one of these providers will catch their baby.  We also discussed that the service includes learners (residents, student) and they will be involved in the care team.  ? ?2. Crohn's disease of small intestine without complication ([place]) ?Last seen by GI and hospitalized in 04/2021 ?At that time had normal EGD/colonoscopy ?MR entero/pelvis showed distal ileum inflammation but otherwise normal ?Reports she is not on therapy at present ?Referred back to [employer] ?Start prenatal ASA ? ? ?Routine obstetric precautions reviewed. ?Return in 4 weeks (on 03/08/2022) for [facility] patient, [facility], ob visit. ? ?  ? ?

## 2022-02-09 LAB — COMPREHENSIVE METABOLIC PANEL
ALT: 9 IU/L (ref 0–32)
AST: 12 IU/L (ref 0–40)
Albumin/Globulin Ratio: 1.1 — ABNORMAL LOW (ref 1.2–2.2)
Albumin: 4.3 g/dL (ref 3.9–5.0)
Alkaline Phosphatase: 63 IU/L (ref 44–121)
BUN/Creatinine Ratio: 13 (ref 9–23)
BUN: 9 mg/dL (ref 6–20)
Bilirubin Total: 0.4 mg/dL (ref 0.0–1.2)
CO2: 17 mmol/L — ABNORMAL LOW (ref 20–29)
Calcium: 9.6 mg/dL (ref 8.7–10.2)
Chloride: 102 mmol/L (ref 96–106)
Creatinine, Ser: 0.69 mg/dL (ref 0.57–1.00)
Globulin, Total: 3.8 g/dL (ref 1.5–4.5)
Glucose: 72 mg/dL (ref 70–99)
Potassium: 4.3 mmol/L (ref 3.5–5.2)
Sodium: 136 mmol/L (ref 134–144)
Total Protein: 8.1 g/dL (ref 6.0–8.5)
eGFR: 123 mL/min/{1.73_m2} (ref >59)

## 2022-02-09 LAB — HCV INTERPRETATION

## 2022-02-09 LAB — CBC/D/PLT+RPR+RH+ABO+RUBIGG...
Antibody Screen: NEGATIVE
Basophils Absolute: 0 10*3/uL (ref 0.0–0.2)
Basos: 0 %
EOS (ABSOLUTE): 0 10*3/uL (ref 0.0–0.4)
Eos: 0 %
HCV Ab: NONREACTIVE
HIV Screen 4th Generation wRfx: NONREACTIVE
Hematocrit: 37.1 % (ref 34.0–46.6)
Hemoglobin: 12.2 g/dL (ref 11.1–15.9)
Hepatitis B Surface Ag: NEGATIVE
Immature Grans (Abs): 0 10*3/uL (ref 0.0–0.1)
Immature Granulocytes: 0 %
Lymphocytes Absolute: 1.8 10*3/uL (ref 0.7–3.1)
Lymphs: 38 %
MCH: 27.5 pg (ref 26.6–33.0)
MCHC: 32.9 g/dL (ref 31.5–35.7)
MCV: 84 fL (ref 79–97)
Monocytes Absolute: 0.3 10*3/uL (ref 0.1–0.9)
Monocytes: 7 %
Neutrophils Absolute: 2.6 10*3/uL (ref 1.4–7.0)
Neutrophils: 55 %
Platelets: 259 10*3/uL (ref 150–450)
RBC: 4.43 x10E6/uL (ref 3.77–5.28)
RDW: 16.1 % — ABNORMAL HIGH (ref 11.7–15.4)
RPR Ser Ql: NONREACTIVE
Rh Factor: POSITIVE
Rubella Antibodies, IGG: 1.35 index (ref >0.99)
WBC: 4.8 10*3/uL (ref 3.4–10.8)

## 2022-02-09 LAB — PROTEIN / CREATININE RATIO, URINE
Creatinine, Urine: 388.9 mg/dL
Protein, Ur: 36.2 mg/dL
Protein/Creat Ratio: 93 mg/g creat (ref 0–200)

## 2022-02-09 LAB — HEMOGLOBIN A1C
Est. average glucose Bld gHb Est-mCnc: 105 mg/dL
Hgb A1c MFr Bld: 5.3 % (ref 4.8–5.6)

## 2022-02-10 LAB — CYTOLOGY - PAP
Chlamydia: NEGATIVE
Comment: NEGATIVE
Comment: NEGATIVE
Comment: NEGATIVE
Comment: NORMAL
Diagnosis: UNDETERMINED — AB
High risk HPV: NEGATIVE
Neisseria Gonorrhea: NEGATIVE
Trichomonas: NEGATIVE

## 2022-02-10 LAB — CULTURE, OB URINE

## 2022-02-10 LAB — URINE CULTURE, OB REFLEX

## 2022-03-08 ENCOUNTER — Ambulatory Visit (INDEPENDENT_AMBULATORY_CARE_PROVIDER_SITE_OTHER): Payer: Medicaid Other | Admitting: Certified Nurse Midwife

## 2022-03-08 VITALS — BP 129/90 | HR 109 | Wt 150.0 lb

## 2022-03-08 DIAGNOSIS — Z3480 Encounter for supervision of other normal pregnancy, unspecified trimester: Secondary | ICD-10-CM

## 2022-03-08 DIAGNOSIS — Z3402 Encounter for supervision of normal first pregnancy, second trimester: Secondary | ICD-10-CM

## 2022-03-08 DIAGNOSIS — Z5941 Food insecurity: Secondary | ICD-10-CM

## 2022-03-08 DIAGNOSIS — Z3A16 16 weeks gestation of pregnancy: Secondary | ICD-10-CM

## 2022-03-09 NOTE — Progress Notes (Signed)
? ?  PRENATAL VISIT NOTE ? ?Subjective:  ?Jennifer Cain is a 26 y.o. G1P0000 at 43w1dbeing seen today for ongoing prenatal care.  She is currently monitored for the following issues for this high-risk pregnancy and has Enteritis-Chronic; FTT (failure to thrive) in adult; Protein calorie malnutrition ([place]; Chronic diarrhea; Nausea & vomiting-chronic; Normocytic anemia; Generalized weakness; Thrombocytosis; Hypokalemia; Malnutrition of moderate degree; Hypomagnesemia; Crohn's disease of small intestine ([place]; Failure to thrive in adult; Colitis; Ileitis; Anemia due to GI blood loss; and Supervision of other normal pregnancy, antepartum on their problem list. ? ?Patient reports no complaints. Per her "sister" (close friend) Jennifer Cain she is being overworked by her employer, having to work up to 14 days in a row with no breaks at work. Has trouble following through with paying bills which causes her a lot of shame and stress and they'd gotten into a heated conversation about such on the way here. ? ? Contractions: Not present. Vag. Bleeding: None.  Movement: Present. Denies leaking of fluid.  ? ?The following portions of the patient's history were reviewed and updated as appropriate: allergies, current medications, past family history, past medical history, past social history, past surgical history and problem list.  ? ?Objective:  ? ?Vitals:  ? 03/08/22 1657 03/08/22 1710  ?BP: (!) 137/97 129/90  ?Pulse: (!) 117 (!) 109  ?Weight: 150 lb (68 kg)   ? ?Fetal Status: Fetal Heart Rate (bpm): 152   Movement: Present    ? ?General:  Alert, oriented and cooperative but quiet, upset and withdrawn. Would not answer questions verbally but would nod and allow Jennifer Cain to answer for her. Tearful at times but did make eye contact and answer affirmatively when asked questions.  ?Skin: Skin is warm and dry. No rash noted.   ?Cardiovascular: Normal heart rate noted  ?Respiratory: Normal respiratory effort, no problems with respiration noted   ?Abdomen: Soft, gravid, appropriate for gestational age.  Pain/Pressure: Absent     ?Pelvic: Cervical exam deferred        ?Extremities: Normal range of motion.  Edema: None  ?Mental Status: Normal mood and affect. Normal behavior. Normal judgment and thought content.  ? ?Assessment and Plan:  ?Pregnancy: G1P0000 at 139w1d1. Supervision of low-risk first pregnancy, second trimester ?- Feeling fetal movement, feeling overwhelmed by daily life and inability to keep up ?- Work note written requesting no more than 5 days in a row, 8hrs per shift WITH at least one meal break per shift. ?- Pt declined any other form of help, [facility] ?2. [redacted] weeks gestation of pregnancy ?- Routine OB care  ? ?3. Food insecurity ?- [facility] ?Preterm labor symptoms and general obstetric precautions including but not limited to vaginal bleeding, contractions, leaking of fluid and fetal movement were reviewed in detail with the patient. ?Please refer to After Visit Summary for other counseling recommendations.  ? ?[facility] ? ?Future Appointments  ?Date Time Provider De[place]?03/29/2022  9:30 AM [facility]-M[facility] NURSE [facility]  ?03/29/2022  9:45 AM [facility]-MFC US4 [facility]-MFCUS [facility]  ?04/07/2022 10:35 AM EcDione PloverMaAnnice NeedyMD [facility]?05/05/2022  9:35 AM EcDione PloverMaAnnice NeedyMD [facility]? ? ?JaGabriel CarinaCNM ?

## 2022-03-12 ENCOUNTER — Encounter: Payer: Self-pay | Admitting: Certified Nurse Midwife

## 2022-03-29 ENCOUNTER — Other Ambulatory Visit: Payer: Self-pay | Admitting: Family Medicine

## 2022-03-29 ENCOUNTER — Ambulatory Visit: Payer: Medicaid Other | Admitting: *Deleted

## 2022-03-29 ENCOUNTER — Ambulatory Visit: Payer: Medicaid Other | Attending: Family Medicine

## 2022-03-29 ENCOUNTER — Encounter: Payer: Self-pay | Admitting: *Deleted

## 2022-03-29 ENCOUNTER — Other Ambulatory Visit: Payer: Self-pay | Admitting: *Deleted

## 2022-03-29 VITALS — BP 115/67 | HR 94

## 2022-03-29 DIAGNOSIS — K50814 Crohn's disease of both small and large intestine with abscess: Secondary | ICD-10-CM | POA: Diagnosis not present

## 2022-03-29 DIAGNOSIS — K5090 Crohn's disease, unspecified, without complications: Secondary | ICD-10-CM

## 2022-03-29 DIAGNOSIS — Z363 Encounter for antenatal screening for malformations: Secondary | ICD-10-CM | POA: Diagnosis present

## 2022-03-29 DIAGNOSIS — Z3480 Encounter for supervision of other normal pregnancy, unspecified trimester: Secondary | ICD-10-CM | POA: Insufficient documentation

## 2022-03-29 DIAGNOSIS — O99612 Diseases of the digestive system complicating pregnancy, second trimester: Secondary | ICD-10-CM | POA: Diagnosis not present

## 2022-03-29 DIAGNOSIS — O26849 Uterine size-date discrepancy, unspecified trimester: Secondary | ICD-10-CM

## 2022-03-29 DIAGNOSIS — Z3A16 16 weeks gestation of pregnancy: Secondary | ICD-10-CM | POA: Insufficient documentation

## 2022-03-29 DIAGNOSIS — Z3689 Encounter for other specified antenatal screening: Secondary | ICD-10-CM

## 2022-04-03 ENCOUNTER — Encounter (HOSPITAL_BASED_OUTPATIENT_CLINIC_OR_DEPARTMENT_OTHER): Payer: Self-pay | Admitting: Emergency Medicine

## 2022-04-03 ENCOUNTER — Other Ambulatory Visit: Payer: Self-pay

## 2022-04-03 ENCOUNTER — Emergency Department (HOSPITAL_BASED_OUTPATIENT_CLINIC_OR_DEPARTMENT_OTHER)
Admission: EM | Admit: 2022-04-03 | Discharge: 2022-04-04 | Disposition: A | Discharge status: Home / Self Care | Payer: Medicaid Other | Attending: Emergency Medicine | Admitting: Emergency Medicine

## 2022-04-03 DIAGNOSIS — R519 Headache, unspecified: Secondary | ICD-10-CM | POA: Insufficient documentation

## 2022-04-03 DIAGNOSIS — O26892 Other specified pregnancy related conditions, second trimester: Secondary | ICD-10-CM | POA: Insufficient documentation

## 2022-04-03 DIAGNOSIS — Z3A00 Weeks of gestation of pregnancy not specified: Secondary | ICD-10-CM | POA: Diagnosis not present

## 2022-04-03 LAB — URINALYSIS, ROUTINE W REFLEX MICROSCOPIC
Glucose, UA: NEGATIVE mg/dL
Hgb urine dipstick: NEGATIVE
Ketones, ur: NEGATIVE mg/dL
Leukocytes,Ua: NEGATIVE
Nitrite: NEGATIVE
Protein, ur: NEGATIVE mg/dL
Specific Gravity, Urine: 1.025 (ref 1.005–1.030)
pH: 6 (ref 5.0–8.0)

## 2022-04-03 MED ORDER — METOCLOPRAMIDE HCL 5 MG/ML IJ SOLN
10.0000 mg | Freq: Once | INTRAMUSCULAR | Status: AC
  Administered 2022-04-03: 10 mg via INTRAVENOUS
  Filled 2022-04-03: qty 2

## 2022-04-03 MED ORDER — SODIUM CHLORIDE 0.9 % IV BOLUS
1000.0000 mL | Freq: Once | INTRAVENOUS | Status: AC
  Administered 2022-04-03: 1000 mL via INTRAVENOUS

## 2022-04-03 NOTE — ED Triage Notes (Signed)
Migraine x 1 week. Pt is 4-5 months pregnant and vomiting after eating anything for the past week.  ?

## 2022-04-03 NOTE — ED Provider Notes (Signed)
? ?[place] [facility] ?Provider Note: [name], MD, [place] ? ?CSN: [id number] ?MRN: [id number] ?ARRIVAL: 04/03/22 at 2131 ?ROOM: MH03/MH03 ? ? ?CHIEF COMPLAINT  ?Headache ? ? ?HISTORY OF PRESENT ILLNESS  ?04/03/22 11:41 PM ?Jennifer Cain is a 26 y.o. female who is about 4 to 5 months pregnant.  She is here with a headache off and on for about the past week.  She describes the headache is by temporal and pounding.  She rates it as a 9 out of 10.  She will get transient relief with Tylenol but the headaches return.  She has never had a formal diagnosis of migraines and she does not usually get similar headaches.  She denies associated photophobia, nausea or vomiting. ? ? ?Past Medical History:  ?Diagnosis Date  ? Crohn disease ([place])   ? ? ?Past Surgical History:  ?Procedure Laterality Date  ? BIOPSY  07/20/2018  ? Procedure: BIOPSY;  Surgeon: Carol Ada, MD;  [facility];  Service: Endoscopy;;  ? BIOPSY  05/04/2021  ? Procedure: BIOPSY;  Surgeon: Thornton Park, MD;  [facility];  Service: Gastroenterology;;  ? COLONOSCOPY WITH PROPOFOL N/A 07/20/2018  ? Procedure: COLONOSCOPY WITH PROPOFOL;  Surgeon: Carol Ada, MD;  [facility];  Service: Endoscopy;  Laterality: N/A;  ? COLONOSCOPY WITH PROPOFOL N/A 05/04/2021  ? Procedure: COLONOSCOPY WITH PROPOFOL;  Surgeon: Thornton Park, MD;  [facility];  Service: Gastroenterology;  Laterality: N/A;  ? ESOPHAGOGASTRODUODENOSCOPY (EGD) WITH PROPOFOL N/A 07/20/2018  ? Procedure: ESOPHAGOGASTRODUODENOSCOPY (EGD) WITH PROPOFOL;  Surgeon: Carol Ada, MD;  [facility];  Service: Endoscopy;  Laterality: N/A;  ? ESOPHAGOGASTRODUODENOSCOPY (EGD) WITH PROPOFOL N/A 05/04/2021  ? Procedure: ESOPHAGOGASTRODUODENOSCOPY (EGD) WITH PROPOFOL;  Surgeon: Thornton Park, MD;  [facility];  Service: Gastroenterology;  Laterality: N/A;  ? ? ?Family History  ?Problem Relation Age of Onset  ? GI Disease Neg Hx   ?  Autoimmune disease Neg Hx   ? ? ?Social History  ? ?Tobacco Use  ? Smoking status: Never  ?  Passive exposure: Never  ? Smokeless tobacco: Never  ?Vaping Use  ? Vaping Use: Never used  ?Substance Use Topics  ? Alcohol use: Never  ? Drug use: Never  ? ? ?Prior to Admission medications   ?Medication Sig Start Date End Date Taking? Authorizing Provider  ?metoCLOPramide (REGLAN) 10 MG tablet Take 1 tablet (10 mg total) by mouth every 6 (six) hours as needed (nausea/headache). 04/04/22  Yes Yaritza Leist, MD  ?Blood Pressure Monitoring DEVI 1 each by Does not apply route once a week. 01/31/22   Clarnce Flock, MD  ?Prenatal Vit-Fe Fumarate-FA (MULTIVITAMIN-PRENATAL) 27-0.8 MG TABS tablet Take 1 tablet by mouth daily 01/09/22   Jeanell Sparrow, DO  ? ? ?Allergies ?Patient has no known allergies. ? ? ?REVIEW OF SYSTEMS  ?Negative except as noted here or in the History of Present Illness. ? ? ?PHYSICAL EXAMINATION  ?Initial Vital Signs ?Blood pressure 130/75, pulse 93, temperature 98.3 ?F (36.8 ?C), temperature source Oral, resp. rate 18, height 5' 3"  (1.6 m), weight 67.6 kg, last menstrual period 11/16/2021, SpO2 100 %. ? ?Examination ?General: Well-developed, well-nourished female in no acute distress; appearance consistent with age of record ?HENT: normocephalic; atraumatic ?Eyes: pupils equal, round and reactive to light; extraocular muscles intact ?Neck: supple ?Heart: regular rate and rhythm ?Lungs: clear to auscultation bilaterally ?Abdomen: soft; nondistended; nontender; bowel sounds present ?Extremities: No deformity; full range of motion; pulses normal ?Neurologic: Awake, alert and oriented;  motor function intact in all extremities and symmetric; no facial droop ?Skin: Warm and dry ?Psychiatric: Normal mood and affect ? ? ?RESULTS  ?Summary of this visit's results, reviewed and interpreted by myself: ? ? EKG Interpretation ? ?Date/Time:    ?Ventricular Rate:    ?PR Interval:    ?QRS Duration:   ?QT Interval:     ?QTC Calculation:   ?R Axis:     ?Text Interpretation:   ?  ? ?  ? ?Laboratory Studies: ?Results for orders placed or performed during the hospital encounter of 04/03/22 (from the past 24 hour(s))  ?Urinalysis, Routine w reflex microscopic Urine, Clean Catch     Status: Abnormal  ? Collection Time: 04/03/22  9:47 PM  ?Result Value Ref Range  ? Color, Urine YELLOW YELLOW  ? APPearance HAZY (A) CLEAR  ? Specific Gravity, Urine 1.025 1.005 - 1.030  ? pH 6.0 5.0 - 8.0  ? Glucose, UA NEGATIVE NEGATIVE mg/dL  ? Hgb urine dipstick NEGATIVE NEGATIVE  ? Bilirubin Urine SMALL (A) NEGATIVE  ? Ketones, ur NEGATIVE NEGATIVE mg/dL  ? Protein, ur NEGATIVE NEGATIVE mg/dL  ? Nitrite NEGATIVE NEGATIVE  ? Leukocytes,Ua NEGATIVE NEGATIVE  ? ?Imaging Studies: ?No results found. ? ?ED COURSE and MDM  ?Nursing notes, initial and subsequent vitals signs, including pulse oximetry, reviewed and interpreted by myself. ? ?Vitals:  ? 04/04/22 0030 04/04/22 0045 04/04/22 0100 04/04/22 0130  ?BP: 117/79 113/78 116/81 117/81  ?Pulse: 81 83 78 80  ?Resp:  17 18 18   ?Temp:      ?TempSrc:      ?SpO2: 99% 95% 98% 98%  ?Weight:      ?Height:      ? ?Medications  ?sodium chloride 0.9 % bolus 1,000 mL (0 mLs Intravenous Stopped 04/04/22 0056)  ?metoCLOPramide (REGLAN) injection 10 mg (10 mg Intravenous Given 04/03/22 2355)  ? ?2:12 AM ?Patient feeling better after IV fluids and Reglan although headache does persist.  It is unclear if this is a migraine as she has no history of migraines. ? ? ?PROCEDURES  ?Procedures ? ? ?ED DIAGNOSES  ? ?  ICD-10-CM   ?1. Headache in pregnancy, antepartum, second trimester  O26.892   ? R51.9   ?  ? ? ? ?  ?Shanon Rosser, MD ?04/04/22 0214 ? ?

## 2022-04-04 MED ORDER — METOCLOPRAMIDE HCL 10 MG PO TABS: 10.0000 mg | ORAL_TABLET | Freq: Four times a day (QID) | ORAL | 0 refills | Status: DC | PRN

## 2022-04-04 NOTE — ED Notes (Signed)
Pt is resting, said that her headache is getting better, no complaints at this time. ?

## 2022-04-07 ENCOUNTER — Encounter: Payer: Medicaid Other | Admitting: Family Medicine

## 2022-04-21 ENCOUNTER — Encounter: Payer: Self-pay | Admitting: Family Medicine

## 2022-04-21 ENCOUNTER — Ambulatory Visit (INDEPENDENT_AMBULATORY_CARE_PROVIDER_SITE_OTHER): Payer: Medicaid Other | Admitting: Family Medicine

## 2022-04-21 VITALS — BP 121/88 | HR 93 | Wt 147.5 lb

## 2022-04-21 DIAGNOSIS — K5000 Crohn's disease of small intestine without complications: Secondary | ICD-10-CM

## 2022-04-21 DIAGNOSIS — Z3480 Encounter for supervision of other normal pregnancy, unspecified trimester: Secondary | ICD-10-CM

## 2022-04-21 NOTE — Patient Instructions (Signed)

## 2022-04-21 NOTE — Progress Notes (Signed)
   Subjective:  Jennifer Cain is a 26 y.o. G1P0000 at 22w4dbeing seen today for ongoing prenatal care.  She is currently monitored for the following issues for this high-risk pregnancy and has Enteritis-Chronic; FTT (failure to thrive) in adult; Protein calorie malnutrition (HBrem[place]; Chronic diarrhea; Nausea & vomiting-chronic; Normocytic anemia; Generalized weakness; Thrombocytosis; Hypokalemia; Malnutrition of moderate degree; Hypomagnesemia; Crohn's disease of small intestine (HLaw[place]; Failure to thrive in adult; Colitis; Ileitis; Anemia due to GI blood loss; and Supervision of other normal pregnancy, antepartum on their problem list.  Patient reports no complaints.  Contractions: Not present. Vag. Bleeding: None.  Movement: Present. Denies leaking of fluid.   The following portions of the patient's history were reviewed and updated as appropriate: allergies, current medications, past family history, past medical history, past social history, past surgical history and problem list. Problem list updated.  Objective:   Vitals:   04/21/22 1037  BP: 121/88  Pulse: 93  Weight: 147 lb 8 oz (66.9 kg)    Fetal Status: Fetal Heart Rate (bpm): 155   Movement: Present     General:  Alert, oriented and cooperative. Patient is in no acute distress.  Skin: Skin is warm and dry. No rash noted.   Cardiovascular: Normal heart rate noted  Respiratory: Normal respiratory effort, no problems with respiration noted  Abdomen: Soft, gravid, appropriate for gestational age. Pain/Pressure: Absent     Pelvic: Vag. Bleeding: None     Cervical exam deferred        Extremities: Normal range of motion.     Mental Status: Normal mood and affect. Normal behavior. Normal judgment and thought content.   Urinalysis:      Assessment and Plan:  Pregnancy: G1P0000 at 112w4d1. Supervision of other normal pregnancy, antepartum BP and FHR normal AFP today - AFP, Serum, Open Spina Bifida  2. Crohn's disease of small  intestine without complication (HCAlmaLast seen by GI and hospitalized in 04/2021 At that time had normal EGD/colonoscopy MR entero/pelvis showed distal ileum inflammation but otherwise normal Reports she is not on therapy at present Referred back to [facility] last visit with me, has not yet seen them Emphasized importance of reconnecting with them   Preterm labor symptoms and general obstetric precautions including but not limited to vaginal bleeding, contractions, leaking of fluid and fetal movement were reviewed in detail with the patient. Please refer to After Visit Summary for other counseling recommendations.  Return in 4 weeks (on 05/19/2022) for [facility] patient, ob visit.   EcClarnce FlockMD

## 2022-04-25 LAB — AFP, SERUM, OPEN SPINA BIFIDA
AFP MoM: 0.8
AFP Value: 49.2 ng/mL
Gest. Age on Collection Date: 19.6 weeks
Maternal Age At EDD: 26.4 yr
OSBR Risk 1 IN: 10000
Test Results:: NEGATIVE
Weight: 148 [lb_av]

## 2022-05-02 ENCOUNTER — Ambulatory Visit: Payer: Medicaid Other | Admitting: *Deleted

## 2022-05-02 ENCOUNTER — Encounter: Payer: Self-pay | Admitting: *Deleted

## 2022-05-02 ENCOUNTER — Ambulatory Visit: Payer: Medicaid Other | Attending: Obstetrics

## 2022-05-02 ENCOUNTER — Other Ambulatory Visit: Payer: Self-pay | Admitting: *Deleted

## 2022-05-02 VITALS — BP 120/76 | HR 90

## 2022-05-02 DIAGNOSIS — Z3A21 21 weeks gestation of pregnancy: Secondary | ICD-10-CM | POA: Diagnosis not present

## 2022-05-02 DIAGNOSIS — O26849 Uterine size-date discrepancy, unspecified trimester: Secondary | ICD-10-CM | POA: Insufficient documentation

## 2022-05-02 DIAGNOSIS — O26842 Uterine size-date discrepancy, second trimester: Secondary | ICD-10-CM

## 2022-05-02 DIAGNOSIS — Z3480 Encounter for supervision of other normal pregnancy, unspecified trimester: Secondary | ICD-10-CM | POA: Diagnosis present

## 2022-05-02 DIAGNOSIS — K5090 Crohn's disease, unspecified, without complications: Secondary | ICD-10-CM | POA: Insufficient documentation

## 2022-05-02 DIAGNOSIS — K50919 Crohn's disease, unspecified, with unspecified complications: Secondary | ICD-10-CM

## 2022-05-02 DIAGNOSIS — O99612 Diseases of the digestive system complicating pregnancy, second trimester: Secondary | ICD-10-CM | POA: Diagnosis present

## 2022-05-02 DIAGNOSIS — Z362 Encounter for other antenatal screening follow-up: Secondary | ICD-10-CM

## 2022-05-04 ENCOUNTER — Encounter: Payer: Self-pay | Admitting: Family Medicine

## 2022-05-05 ENCOUNTER — Encounter: Payer: Medicaid Other | Admitting: Family Medicine

## 2022-05-09 ENCOUNTER — Telehealth (INDEPENDENT_AMBULATORY_CARE_PROVIDER_SITE_OTHER): Payer: Medicaid Other | Admitting: Family Medicine

## 2022-05-09 DIAGNOSIS — Z3480 Encounter for supervision of other normal pregnancy, unspecified trimester: Secondary | ICD-10-CM

## 2022-05-09 DIAGNOSIS — R000 Tachycardia, unspecified: Secondary | ICD-10-CM

## 2022-05-09 DIAGNOSIS — K5000 Crohn's disease of small intestine without complications: Secondary | ICD-10-CM

## 2022-05-09 DIAGNOSIS — O99612 Diseases of the digestive system complicating pregnancy, second trimester: Secondary | ICD-10-CM

## 2022-05-09 DIAGNOSIS — Z3A22 22 weeks gestation of pregnancy: Secondary | ICD-10-CM

## 2022-05-09 DIAGNOSIS — O365920 Maternal care for other known or suspected poor fetal growth, second trimester, not applicable or unspecified: Secondary | ICD-10-CM

## 2022-05-09 DIAGNOSIS — O365990 Maternal care for other known or suspected poor fetal growth, unspecified trimester, not applicable or unspecified: Secondary | ICD-10-CM | POA: Insufficient documentation

## 2022-05-09 DIAGNOSIS — O99412 Diseases of the circulatory system complicating pregnancy, second trimester: Secondary | ICD-10-CM

## 2022-05-09 NOTE — Patient Instructions (Signed)

## 2022-05-09 NOTE — Progress Notes (Signed)
I connected with Jennifer Cain 05/09/22 at 11:15 AM EDT by: MyChart video and verified that I am speaking with the correct person using two identifiers.  Patient is located at home and provider is located at [facility].     I discussed the limitations, risks, security and privacy concerns of performing an evaluation and management service by MyChart video and the availability of in person appointments. I also discussed with the patient that there may be a patient responsible charge related to this service. By engaging in this virtual visit, you consent to the provision of healthcare.  Additionally, you authorize for your insurance to be billed for the services provided during this visit.  The patient expressed understanding and agreed to proceed.  After multiple attempts at using video however, had to switch to doing a phone visit as I could not hear her audio on the video visit due to a poor connection.  The following staff members participated in the virtual visit:  Clarnce Flock, MD/MPH Attending Family Medicine Physician, [place] for [facility], [place] VISIT NOTE  Subjective:  Jennifer Cain is a 26 y.o. G1P0000 at [redacted]w[redacted]d for a telephone visit for ongoing prenatal care.  She is currently monitored for the following issues for this high-risk pregnancy and has Enteritis-Chronic; FTT (failure to thrive) in adult; Protein calorie malnutrition ([place]; Chronic diarrhea; Nausea & vomiting-chronic; Normocytic anemia; Generalized weakness; Thrombocytosis; Hypokalemia; Malnutrition of moderate degree; Hypomagnesemia; Crohn's disease of small intestine (HNob[place]; Failure to thrive in adult; Colitis; Ileitis; Anemia due to GI blood loss; Supervision of other normal pregnancy, antepartum; and SGA (small for gestational age) on their problem list.  Patient reports no complaints.  Contractions: Not present. Vag. Bleeding: None.  Movement: Present.  Denies leaking of fluid.   The following portions of the patient's history were reviewed and updated as appropriate: allergies, current medications, past family history, past medical history, past social history, past surgical history and problem list.   Objective:  There were no vitals filed for this visit. Self-Obtained  Fetal Status:     Movement: Present     Assessment and Plan:  Pregnancy: G1P0000 at 240w1d. Supervision of other normal pregnancy, antepartum Scheduled for in person visit but continues to have transportation difficulties RN discussed Medicaid transportation with her and need to give at least two days notice to be able to get a ride Fetal movement normal today Up to date on testing Sometimes feels like her heart is racing with position changes. Advised hydration, slow position changes, if she ever feels like she is having a prolonged episode that won't go away she should go to MAU for eval for possible SVT  2. Crohn's disease of small intestine without complication (HCFuigHas follow up appt with GI next week on 05/17/2022 No symptoms currently  3. SGA (small for gestational age) 10% on most recent anatomy scan Following w MFM  Preterm labor symptoms and general obstetric precautions including but not limited to vaginal bleeding, contractions, leaking of fluid and fetal movement were reviewed in detail with the patient.  Return in 4 weeks (on 06/06/2022) for [facility].  Future Appointments  Date Time Provider [facility] [facility]6/28/2023 10:00 AM EsLeotis PainBGI-GI [facility]/03/2022 10:30 AM [facility] NURSE [facility]7/03/2022 10:45 AM W[facility]-MFC US6 [facility]FCUS [facility]7/09/2022  1:15 PM WeDanielle [facility]MO[facility]7/26/2023  8:15 AM  Clarnce Flock, MD [place] [facility]  06/14/2022  8:20 AM WMC-WOCA LAB [facility]  06/29/2022  1:35 PM Caren Macadam, MD [facility]  07/12/2022  9:55 AM Clarnce Flock, MD [facility]     Time spent  on virtual visit: 10 minutes  Clarnce Flock, MD

## 2022-05-17 ENCOUNTER — Encounter: Payer: Self-pay | Admitting: Physician Assistant

## 2022-05-17 ENCOUNTER — Ambulatory Visit (INDEPENDENT_AMBULATORY_CARE_PROVIDER_SITE_OTHER): Payer: Medicaid Other | Admitting: Physician Assistant

## 2022-05-17 ENCOUNTER — Other Ambulatory Visit (INDEPENDENT_AMBULATORY_CARE_PROVIDER_SITE_OTHER): Payer: Medicaid Other

## 2022-05-17 VITALS — BP 110/70 | HR 80 | Ht 63.0 in | Wt 151.8 lb

## 2022-05-17 DIAGNOSIS — K5000 Crohn's disease of small intestine without complications: Secondary | ICD-10-CM | POA: Diagnosis not present

## 2022-05-17 DIAGNOSIS — Z862 Personal history of diseases of the blood and blood-forming organs and certain disorders involving the immune mechanism: Secondary | ICD-10-CM | POA: Diagnosis not present

## 2022-05-17 DIAGNOSIS — O0001 Abdominal pregnancy with intrauterine pregnancy: Secondary | ICD-10-CM | POA: Diagnosis not present

## 2022-05-17 LAB — CBC WITH DIFFERENTIAL/PLATELET
Basophils Absolute: 0 10*3/uL (ref 0.0–0.1)
Basophils Relative: 0.4 % (ref 0.0–3.0)
Eosinophils Absolute: 0 10*3/uL (ref 0.0–0.7)
Eosinophils Relative: 0.6 % (ref 0.0–5.0)
HCT: 28 % — ABNORMAL LOW (ref 36.0–46.0)
Hemoglobin: 9.4 g/dL — ABNORMAL LOW (ref 12.0–15.0)
Lymphocytes Relative: 33.8 % (ref 12.0–46.0)
Lymphs Abs: 1.8 10*3/uL (ref 0.7–4.0)
MCHC: 33.6 g/dL (ref 30.0–36.0)
MCV: 88.1 fl (ref 78.0–100.0)
Monocytes Absolute: 0.4 10*3/uL (ref 0.1–1.0)
Monocytes Relative: 7.2 % (ref 3.0–12.0)
Neutro Abs: 3 10*3/uL (ref 1.4–7.7)
Neutrophils Relative %: 58 % (ref 43.0–77.0)
Platelets: 324 10*3/uL (ref 150.0–400.0)
RBC: 3.18 Mil/uL — ABNORMAL LOW (ref 3.87–5.11)
RDW: 14.5 % (ref 11.5–15.5)
WBC: 5.2 10*3/uL (ref 4.0–10.5)

## 2022-05-17 LAB — IBC + FERRITIN
Ferritin: 10 ng/mL (ref 10.0–291.0)
Iron: 47 ug/dL (ref 42–145)
Saturation Ratios: 10.8 % — ABNORMAL LOW (ref 20.0–50.0)
TIBC: 435.4 ug/dL (ref 250.0–450.0)
Transferrin: 311 mg/dL (ref 212.0–360.0)

## 2022-05-17 LAB — C-REACTIVE PROTEIN: CRP: 3.3 mg/dL (ref 0.5–20.0)

## 2022-05-17 LAB — SEDIMENTATION RATE: Sed Rate: 108 mm/hr — ABNORMAL HIGH (ref 0–20)

## 2022-05-17 NOTE — Progress Notes (Signed)
Subjective:    Patient ID: Jennifer Cain, female    DOB: [birth year], 26 y.o.   MRN: [id number]  HPI Jennifer is a 26 year old African-American female, established with Dr. Bryan Lemma, and last seen in 2019. She is referred today by OB/GYN/Dr. Clayton Lefort. She had initially been seen during hospital consultation and was then seen in the office months with new diagnosis of small bowel Crohn's disease.  She was on a steroid taper, was to be considered for biologic therapy but did not follow-up. She was then hospitalized in June 2022 and underwent repeat endoscopic evaluation for possible Crohn's flare with heme positive stool, and anemia EGD per Dr. Hilarie Fredrickson was normal and biopsies of the stomach and duodenum were unremarkable, no H. pylori. At colonoscopy exam was normal, the terminal ileum appeared normal with the exception of mild patchy erythema.  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 She also had MR enterography done during that hospitalization which showed wall thickening and abnormal enhancement of the distal and terminal ileum consistent with active Crohn's disease, no other significant findings. She was discharged from the hospital on Pentasa, dicyclomine and a steroid taper. Today she says she does not remember how long she took any of those medications, perhaps a month and then stopped them because she was feeling okay.  She did not maintain compliance for office follow-up.  She was also iron deficient at that time and was to have iron replacement as an outpatient.. She has not been on any medications for Crohn's over the past year.  She is now 5 months pregnant, with due date of September 11, 2022.  This is her first pregnancy. She says she has been doing well during her pregnancy other than intermittent nausea.  She has no complaints of abdominal pain, cramping or alteration in bowel habits she says her bowel movements  have been normal, denies any issues with diarrhea melena or hematochezia.  Appetite has been okay and she has been gaining weight appropriately though she says that she was told that her baby is measuring small for gestational age. She is taking a prenatal vitamin.  Review of Systems;Pertinent positive and negative review of systems were noted in the above HPI section.  All other review of systems was otherwise negative.   Outpatient Encounter Medications as of 05/17/2022  Medication Sig   metoCLOPramide (REGLAN) 10 MG tablet Take 1 tablet (10 mg total) by mouth every 6 (six) hours as needed (nausea/headache).   Prenatal Vit-Fe Fumarate-FA (MULTIVITAMIN-PRENATAL) 27-0.8 MG TABS tablet Take 1 tablet by mouth daily   No facility-administered encounter medications on file as of 05/17/2022.   No Known Allergies Patient Active Problem List   Diagnosis Date Noted   SGA (small for gestational age) 05/09/2022   Supervision of other normal pregnancy, antepartum 01/31/2022   Anemia due to GI blood loss    Ileitis 04/30/2021   Colitis 08/09/2018   Crohn's disease of small intestine ([place]) 07/24/2018   Failure to thrive in adult    Hypomagnesemia 07/20/2018   Normocytic anemia 07/19/2018   Generalized weakness 07/19/2018   Thrombocytosis 07/19/2018   Hypokalemia 07/19/2018   Malnutrition of moderate degree 07/19/2018   Enteritis-Chronic 07/18/2018   FTT (failure to thrive) in adult 07/18/2018   Protein calorie malnutrition ([place]) 07/18/2018   Chronic diarrhea 07/18/2018   Nausea & vomiting-chronic 07/18/2018   Social History   Socioeconomic History   Marital status: Single  Spouse name: Not on file   Number of children: Not on file   Years of education: Not on file   Highest education level: Not on file  Occupational History   Not on file  Tobacco Use   Smoking status: Never    Passive exposure: Never   Smokeless tobacco: Never  Vaping Use   Vaping Use: Never used  Substance and  Sexual Activity   Alcohol use: Never   Drug use: Never   Sexual activity: Yes    Birth control/protection: None  Other Topics Concern   Not on file  Social History Narrative   Not on file   Social Determinants of Health   Financial Resource Strain: Not on file  Food Insecurity: No Food Insecurity (03/08/2022)   Hunger Vital Sign    Worried About Running Out of Food in the Last Year: Never true    Ran Out of Food in the Last Year: Never true  Transportation Needs: No Transportation Needs (03/08/2022)   PRAPARE - Hydrologist (Medical): No    Lack of Transportation (Non-Medical): No  Physical Activity: Not on file  Stress: Not on file  Social Connections: Not on file  Intimate Partner Violence: Not on file    Ms. Stockinger's family history is not on file.      Objective:    Vitals:   05/17/22 0952  BP: 110/70  Pulse: 80    Physical Exam Well-developed well-nourished African-American female in no acute distress.  Accompanied by her boyfriend.  Weight, 151 BMI 26.8  HEENT; nontraumatic normocephalic, EOMI, PE R LA, sclera anicteric. Oropharynx; not examined today Neck; supple, no JVD Cardiovascular; regular rate and rhythm with S1-S2, no murmur rub or gallop Pulmonary; Clear bilaterally Abdomen; soft, nontender, nondistended, no palpable mass or hepatosplenomegaly, bowel sounds are active.  Gravid uterus palpable below the umbilicus Rectal; not done today Skin; benign exam, no jaundice rash or appreciable lesions Extremities; no clubbing cyanosis or edema skin warm and dry Neuro/Psych; alert and oriented x4, grossly nonfocal mood and affect appropriate        Assessment & Plan:   #82 26 year old African-American female with diagnosis of Crohn's ileitis with initial diagnosis 2019 with hospitalization. Patient did not follow-up as directed after that hospitalization.  She was rehospitalized June 2022 with anemia, heme positive stool and  abdominal pain and underwent repeat GI evaluation at that time MR enterography showed active Crohn's of the distal and terminal ileum EGD negative Colonoscopy negative other than mild erythema at the terminal ileum, however biopsies showed active ileitis and random biopsies from the colon also showed findings consistent with active colitis.  Again she did not comply with outpatient follow-up and did not continue on any medications for management of Crohn's or iron deficiency.  #2 iron deficiency anemia #3 intrauterine pregnancy 20 weeks-due date September 11, 2022  Patient does not have any active Crohn's symptoms currently  Plan; CBC with differential, sed rate, CRP, fecal calprotectin Iron studies Patient has not been on any therapy over the past year for Crohn's.  I am not going to start any treatment today. Will await results of labs and stool study, then discussed with Dr. Bryan Lemma.  I am concerned about compliance. We have scheduled her a follow-up appointment in the office with Dr. Bryan Lemma in about 6 weeks I advised her to call in the interim should she develop any symptoms concerning for Crohn's in the interim.   Miski Feldpausch S Cathryn Gallery PA-C  05/17/2022   Cc: Clarnce Flock, MD

## 2022-05-17 NOTE — Patient Instructions (Signed)
If you are age 26 or younger, your body mass index should be between 19-25. Your Body mass index is 26.89 kg/m. If this is out of the aformentioned range listed, please consider follow up with your Primary Care Provider.  ________________________________________________________  The [facility] providers would like to encourage you to use [facility] to communicate with providers for non-urgent requests or questions.  Due to long hold times on the telephone, sending your provider a message by [facility] may be a faster and more efficient way to get a response.  Please allow 48 business hours for a response.  Please remember that this is for non-urgent requests.  _______________________________________________________  Your provider has requested that you go to the basement level for lab work before leaving today. Press "B" on the elevator. The lab is located at the first door on the left as you exit the elevator.  You have been scheduled to follow up with Dr. Bryan Lemma on July 04, 2022 at 10:40 am  Thank you for entrusting me with your care and choosing [facility].  Amy Esterwood, PA-C

## 2022-05-18 ENCOUNTER — Other Ambulatory Visit: Payer: Medicaid Other

## 2022-05-18 DIAGNOSIS — K5000 Crohn's disease of small intestine without complications: Secondary | ICD-10-CM

## 2022-05-18 DIAGNOSIS — O0001 Abdominal pregnancy with intrauterine pregnancy: Secondary | ICD-10-CM

## 2022-05-18 DIAGNOSIS — Z862 Personal history of diseases of the blood and blood-forming organs and certain disorders involving the immune mechanism: Secondary | ICD-10-CM

## 2022-05-22 LAB — CALPROTECTIN, FECAL: Calprotectin, Fecal: 7770 ug/g — ABNORMAL HIGH (ref 0–120)

## 2022-05-24 ENCOUNTER — Ambulatory Visit: Payer: Medicaid Other

## 2022-05-24 ENCOUNTER — Ambulatory Visit: Payer: Medicaid Other | Attending: Obstetrics

## 2022-05-24 ENCOUNTER — Ambulatory Visit: Payer: Medicaid Other | Admitting: *Deleted

## 2022-05-24 ENCOUNTER — Other Ambulatory Visit: Payer: Self-pay | Admitting: *Deleted

## 2022-05-24 VITALS — BP 119/62 | HR 89

## 2022-05-24 DIAGNOSIS — O99612 Diseases of the digestive system complicating pregnancy, second trimester: Secondary | ICD-10-CM

## 2022-05-24 DIAGNOSIS — Z3A24 24 weeks gestation of pregnancy: Secondary | ICD-10-CM | POA: Diagnosis not present

## 2022-05-24 DIAGNOSIS — K50919 Crohn's disease, unspecified, with unspecified complications: Secondary | ICD-10-CM | POA: Insufficient documentation

## 2022-05-24 DIAGNOSIS — O0992 Supervision of high risk pregnancy, unspecified, second trimester: Secondary | ICD-10-CM

## 2022-05-24 DIAGNOSIS — O365990 Maternal care for other known or suspected poor fetal growth, unspecified trimester, not applicable or unspecified: Secondary | ICD-10-CM | POA: Diagnosis present

## 2022-05-24 DIAGNOSIS — Z362 Encounter for other antenatal screening follow-up: Secondary | ICD-10-CM | POA: Insufficient documentation

## 2022-05-24 NOTE — Progress Notes (Signed)
Agree with the assessment and plan as outlined by Nicoletta Ba, PA-C.  Dewarren Ledbetter, DO, [facility]

## 2022-05-25 ENCOUNTER — Encounter: Payer: Self-pay | Admitting: Family Medicine

## 2022-05-30 ENCOUNTER — Ambulatory Visit (INDEPENDENT_AMBULATORY_CARE_PROVIDER_SITE_OTHER): Payer: Medicaid Other | Admitting: Medical

## 2022-05-30 ENCOUNTER — Other Ambulatory Visit: Payer: Self-pay

## 2022-05-30 ENCOUNTER — Encounter: Payer: Self-pay | Admitting: Medical

## 2022-05-30 VITALS — BP 115/80 | HR 92 | Wt 153.3 lb

## 2022-05-30 DIAGNOSIS — Z3A25 25 weeks gestation of pregnancy: Secondary | ICD-10-CM

## 2022-05-30 DIAGNOSIS — Z3480 Encounter for supervision of other normal pregnancy, unspecified trimester: Secondary | ICD-10-CM

## 2022-05-30 DIAGNOSIS — K5000 Crohn's disease of small intestine without complications: Secondary | ICD-10-CM

## 2022-05-30 NOTE — Patient Instructions (Addendum)
Remember that at your next visit you will get labs drawn. You need to be fasting (nothing to eat or drink for at least 8 hours prior to arrival) for those labs. We will be testing for gestational diabetes, anemia, HIV and syphilis. You will be in the office for at least 2 hours to complete the lab tests and your visit will occur during this time. You will also be offered the TDAP vaccine for whooping cough.   Childbirth Education Options: [facility] Department Classes:  Childbirth education classes can help you get ready for a positive parenting experience. You can also meet other expectant parents and get free stuff for your baby. Each class runs for five weeks on the same night and costs $45 for the mother-to-be and her support person. Medicaid covers the cost if you are eligible. Call [phone] to register. [facility] Childbirth Education: Classes can vary in availability and schedule is subject to change. For most up-to-date information please visit [email or web address] to review and register.        TDaP Vaccine Pregnancy Get the Whooping Cough Vaccine While You Are Pregnant (CDC)  It is important for women to get the whooping cough vaccine in the third trimester of each pregnancy. Vaccines are the best way to prevent this disease. There are 2 different whooping cough vaccines. Both vaccines combine protection against whooping cough, tetanus and diphtheria, but they are for different age groups: Tdap: for everyone 11 years or older, including pregnant women  DTaP: for children 2 months through 76 years of age  You need the whooping cough vaccine during each of your pregnancies The recommended time to get the shot is during your 27th through 36th week of pregnancy, preferably during the earlier part of this time period. The Centers for Disease Control and Prevention (CDC) recommends that pregnant women receive the whooping cough vaccine for adolescents and  adults (called Tdap vaccine) during the third trimester of each pregnancy. The recommended time to get the shot is during your 27th through 36th week of pregnancy, preferably during the earlier part of this time period. This replaces the original recommendation that pregnant women get the vaccine only if they had not previously received it. The [employer] of Obstetricians and Gynecologists and the [employer] support this recommendation.  You should get the whooping cough vaccine while pregnant to pass protection to your baby frame support disabled and/or not supported in this browser  Learn why Mickel Baas decided to get the whooping cough vaccine in her 3rd trimester of pregnancy and how her baby girl was born with some protection against the disease. Also available on YouTube. After receiving the whooping cough vaccine, your body will create protective antibodies (proteins produced by the body to fight off diseases) and pass some of them to your baby before birth. These antibodies provide your baby some short-term protection against whooping cough in early life. These antibodies can also protect your baby from some of the more serious complications that come along with whooping cough. Your protective antibodies are at their highest about 2 weeks after getting the vaccine, but it takes time to pass them to your baby. So the preferred time to get the whooping cough vaccine is early in your third trimester. The amount of whooping cough antibodies in your body decreases over time. That is why CDC recommends you get a whooping cough vaccine during each pregnancy. Doing so allows each of your babies to get the greatest number  of protective antibodies from you. This means each of your babies will get the best protection possible against this disease.  Getting the whooping cough vaccine while pregnant is better than getting the vaccine after you give birth Whooping cough vaccination  during pregnancy is ideal so your baby will have short-term protection as soon as he is born. This early protection is important because your baby will not start getting his whooping cough vaccines until he is 2 months old. These first few months of life are when your baby is at greatest risk for catching whooping cough. This is also when he's at greatest risk for having severe, potentially life-threating complications from the infection. To avoid that gap in protection, it is best to get a whooping cough vaccine during pregnancy. You will then pass protection to your baby before he is born. To continue protecting your baby, he should get whooping cough vaccines starting at 2 months old. You may never have gotten the Tdap vaccine before and did not get it during this pregnancy. If so, you should make sure to get the vaccine immediately after you give birth, before leaving the hospital or birthing center. It will take about 2 weeks before your body develops protection (antibodies) in response to the vaccine. Once you have protection from the vaccine, you are less likely to give whooping cough to your newborn while caring for him. But remember, your baby will still be at risk for catching whooping cough from others. A recent study looked to see how effective Tdap was at preventing whooping cough in babies whose mothers got the vaccine while pregnant or in the hospital after giving birth. The study found that getting Tdap between 27 through 36 weeks of pregnancy is 85% more effective at preventing whooping cough in babies younger than 2 months old. Blood tests cannot tell if you need a whooping cough vaccine There are no blood tests that can tell you if you have enough antibodies in your body to protect yourself or your baby against whooping cough. Even if you have been sick with whooping cough in the past or previously received the vaccine, you still should get the vaccine during each pregnancy. Breastfeeding may  pass some protective antibodies onto your baby By breastfeeding, you may pass some antibodies you have made in response to the vaccine to your baby. When you get a whooping cough vaccine during your pregnancy, you will have antibodies in your breast milk that you can share with your baby as soon as your milk comes in. However, your baby will not get protective antibodies immediately if you wait to get the whooping cough vaccine until after delivering your baby. This is because it takes about 2 weeks for your body to create antibodies. Learn more about the health benefits of breastfeeding.

## 2022-05-30 NOTE — Progress Notes (Signed)
   PRENATAL VISIT NOTE  Subjective:  Jennifer Cain is a 26 y.o. G1P0000 at 62w1dbeing seen today for ongoing prenatal care.  She is currently monitored for the following issues for this high-risk pregnancy and has Enteritis-Chronic; FTT (failure to thrive) in adult; Protein calorie malnutrition ([place]; Chronic diarrhea; Nausea & vomiting-chronic; Normocytic anemia; Generalized weakness; Thrombocytosis; Hypokalemia; Malnutrition of moderate degree; Hypomagnesemia; Crohn's disease of small intestine ([place]; Failure to thrive in adult; Colitis; Ileitis; Anemia due to GI blood loss; Supervision of other normal pregnancy, antepartum; and SGA (small for gestational age) on their problem list.  Patient reports occasional contractions and intermittent heart racing.  Contractions: Not present. Vag. Bleeding: None.  Movement: Present. Denies leaking of fluid.   The following portions of the patient's history were reviewed and updated as appropriate: allergies, current medications, past family history, past medical history, past social history, past surgical history and problem list.   Objective:   Vitals:   05/30/22 1333  BP: 115/80  Pulse: 92  Weight: 153 lb 4.8 oz (69.5 kg)    Fetal Status: Fetal Heart Rate (bpm): 148 Fundal Height: 26 cm Movement: Present     General:  Alert, oriented and cooperative. Patient is in no acute distress.  Skin: Skin is warm and dry. No rash noted.   Cardiovascular: Normal heart rate noted  Respiratory: Normal respiratory effort, no problems with respiration noted  Abdomen: Soft, gravid, appropriate for gestational age.  Pain/Pressure: Absent     Pelvic: Cervical exam deferred        Extremities: Normal range of motion.  Edema: Trace  Mental Status: Normal mood and affect. Normal behavior. Normal judgment and thought content.   Assessment and Plan:  Pregnancy: G1P0000 at 232w1d. Supervision of other normal pregnancy, antepartum - Doing well - CBE given  -  Considering PP IUD for birth control  - Anticipatory guidance for fasting third trimester labs and TDAP at next visit   2. SGA (small for gestational age) - Last [place]/5 - EFW 19%  3. Crohn's disease of small intestine without complication (HCC) - Saw GI, no issues, no meds recommended at this time   4. [redacted] weeks gestation of pregnancy  Preterm labor symptoms and general obstetric precautions including but not limited to vaginal bleeding, contractions, leaking of fluid and fetal movement were reviewed in detail with the patient. Please refer to After Visit Summary for other counseling recommendations.   Return in about 4 weeks (around 06/27/2022) for 28 week labs (fasting), Mom+baby, LOB, as scheduled.  Future Appointments  Date Time Provider [place]7/26/2023  8:15 AM EcClarnce FlockMD [facility]7/26/2023  8:20 AM W[facility]-WOCA LAB [facility]8/08/2022  1:35 PM NeCaren MacadamMD [facility]8/15/2023 10:40 AM CiLavena BullionDO LBGI-GI LBPCGastro  07/12/2022  9:55 AM EcClarnce FlockMD [facility]8/30/2023 10:30 AM WMC-MFC NURSE [facility]MFC [facility]8/30/2023 10:45 AM WMC-MFC US5 [facility]MFCUS [facility]9/04/2022 10:35 AM NeCaren MacadamMD [facility]9/28/2023  1:15 PM PrDonnamae JudeMD [facility]  JuKerry HoughPA-C

## 2022-06-13 ENCOUNTER — Other Ambulatory Visit: Payer: Self-pay

## 2022-06-13 DIAGNOSIS — Z3480 Encounter for supervision of other normal pregnancy, unspecified trimester: Secondary | ICD-10-CM

## 2022-06-14 ENCOUNTER — Ambulatory Visit (INDEPENDENT_AMBULATORY_CARE_PROVIDER_SITE_OTHER): Payer: Medicaid Other | Admitting: Family Medicine

## 2022-06-14 ENCOUNTER — Encounter: Payer: Self-pay | Admitting: Family Medicine

## 2022-06-14 ENCOUNTER — Other Ambulatory Visit: Payer: Medicaid Other

## 2022-06-14 ENCOUNTER — Other Ambulatory Visit: Payer: Self-pay

## 2022-06-14 VITALS — BP 114/80 | HR 98 | Wt 159.5 lb

## 2022-06-14 DIAGNOSIS — Z3480 Encounter for supervision of other normal pregnancy, unspecified trimester: Secondary | ICD-10-CM

## 2022-06-14 DIAGNOSIS — K5000 Crohn's disease of small intestine without complications: Secondary | ICD-10-CM

## 2022-06-14 DIAGNOSIS — Z23 Encounter for immunization: Secondary | ICD-10-CM | POA: Diagnosis not present

## 2022-06-14 NOTE — Patient Instructions (Signed)

## 2022-06-14 NOTE — Progress Notes (Signed)
   Subjective:  Jennifer Cain is a 26 y.o. G1P0000 at 52w2dbeing seen today for ongoing prenatal care.  She is currently monitored for the following issues for this high-risk pregnancy and has Enteritis-Chronic; FTT (failure to thrive) in adult; Protein calorie malnutrition ([place]; Chronic diarrhea; Nausea & vomiting-chronic; Normocytic anemia; Generalized weakness; Thrombocytosis; Hypokalemia; Malnutrition of moderate degree; Hypomagnesemia; Crohn's disease of small intestine ([place]; Failure to thrive in adult; Colitis; Ileitis; Anemia due to GI blood loss; Supervision of other normal pregnancy, antepartum; and SGA (small for gestational age) on their problem list.  Patient reports no complaints.  Contractions: Not present. Vag. Bleeding: None.  Movement: Present. Denies leaking of fluid.   The following portions of the patient's history were reviewed and updated as appropriate: allergies, current medications, past family history, past medical history, past social history, past surgical history and problem list. Problem list updated.  Objective:   Vitals:   06/14/22 0832  BP: 114/80  Pulse: 98  Weight: 159 lb 8 oz (72.3 kg)    Fetal Status: Fetal Heart Rate (bpm): 152   Movement: Present     General:  Alert, oriented and cooperative. Patient is in no acute distress.  Skin: Skin is warm and dry. No rash noted.   Cardiovascular: Normal heart rate noted  Respiratory: Normal respiratory effort, no problems with respiration noted  Abdomen: Soft, gravid, appropriate for gestational age. Pain/Pressure: Present     Pelvic: Vag. Bleeding: None     Cervical exam deferred        Extremities: Normal range of motion.     Mental Status: Normal mood and affect. Normal behavior. Normal judgment and thought content.   Urinalysis:      Assessment and Plan:  Pregnancy: G1P0000 at 211w2d1. Supervision of other normal pregnancy, antepartum BP and FHR normal 28 week labs today TDAP given as well Some  swelling in feet, stands for long periods at work, recommended compression stockings  2. Crohn's disease of small intestine without complication ([place]CHannibalSaw GI recently, no meds Reports she has been using the bathroom more often than usual Doesn't quite feel like a flare but might have one coming on Strongly urged her to get in touch with [place] GI office if she feels she is worsening prior to the next appt, we can send some steroids in if that's the case until she goes back to see GI on 07/04/2022  Preterm labor symptoms and general obstetric precautions including but not limited to vaginal bleeding, contractions, leaking of fluid and fetal movement were reviewed in detail with the patient. Please refer to After Visit Summary for other counseling recommendations.  Return in 2 weeks (on 06/28/2022) for [place] patient, ob visit.   EcClarnce FlockMD

## 2022-06-15 ENCOUNTER — Other Ambulatory Visit: Payer: Self-pay | Admitting: Family Medicine

## 2022-06-15 ENCOUNTER — Encounter: Payer: Self-pay | Admitting: Family Medicine

## 2022-06-15 DIAGNOSIS — O99019 Anemia complicating pregnancy, unspecified trimester: Secondary | ICD-10-CM | POA: Insufficient documentation

## 2022-06-15 LAB — CBC
Hematocrit: 28.7 % — ABNORMAL LOW (ref 34.0–46.6)
Hemoglobin: 9.5 g/dL — ABNORMAL LOW (ref 11.1–15.9)
MCH: 29.2 pg (ref 26.6–33.0)
MCHC: 33.1 g/dL (ref 31.5–35.7)
MCV: 88 fL (ref 79–97)
Platelets: 229 10*3/uL (ref 150–450)
RBC: 3.25 x10E6/uL — ABNORMAL LOW (ref 3.77–5.28)
RDW: 13.8 % (ref 11.7–15.4)
WBC: 6.6 10*3/uL (ref 3.4–10.8)

## 2022-06-15 LAB — GLUCOSE TOLERANCE, 2 HOURS W/ 1HR
Glucose, 1 hour: 108 mg/dL (ref 70–179)
Glucose, 2 hour: 117 mg/dL (ref 70–152)
Glucose, Fasting: 84 mg/dL (ref 70–91)

## 2022-06-15 LAB — RPR: RPR Ser Ql: NONREACTIVE

## 2022-06-15 LAB — HIV ANTIBODY (ROUTINE TESTING W REFLEX): HIV Screen 4th Generation wRfx: NONREACTIVE

## 2022-06-15 MED ORDER — FERROUS SULFATE 325 (65 FE) MG PO TBEC: 325.0000 mg | DELAYED_RELEASE_TABLET | ORAL | 2 refills | Status: DC

## 2022-06-16 ENCOUNTER — Telehealth: Payer: Self-pay | Admitting: Internal Medicine

## 2022-06-16 ENCOUNTER — Inpatient Hospital Stay (HOSPITAL_COMMUNITY)
Admission: AD | Admit: 2022-06-16 | Discharge: 2022-06-16 | Disposition: A | Discharge status: Home / Self Care | Payer: Medicaid Other | Attending: Obstetrics and Gynecology | Admitting: Obstetrics and Gynecology

## 2022-06-16 ENCOUNTER — Telehealth: Payer: Self-pay

## 2022-06-16 ENCOUNTER — Encounter (HOSPITAL_COMMUNITY): Payer: Self-pay | Admitting: Obstetrics and Gynecology

## 2022-06-16 ENCOUNTER — Encounter: Payer: Self-pay | Admitting: Family Medicine

## 2022-06-16 DIAGNOSIS — O99612 Diseases of the digestive system complicating pregnancy, second trimester: Secondary | ICD-10-CM | POA: Diagnosis not present

## 2022-06-16 DIAGNOSIS — D500 Iron deficiency anemia secondary to blood loss (chronic): Secondary | ICD-10-CM | POA: Diagnosis not present

## 2022-06-16 DIAGNOSIS — Z3A27 27 weeks gestation of pregnancy: Secondary | ICD-10-CM | POA: Diagnosis not present

## 2022-06-16 DIAGNOSIS — Z6790 Unspecified blood type, Rh positive: Secondary | ICD-10-CM | POA: Insufficient documentation

## 2022-06-16 DIAGNOSIS — K50911 Crohn's disease, unspecified, with rectal bleeding: Secondary | ICD-10-CM | POA: Diagnosis not present

## 2022-06-16 LAB — CBC WITH DIFFERENTIAL/PLATELET
Abs Immature Granulocytes: 0.02 10*3/uL (ref 0.00–0.07)
Basophils Absolute: 0 10*3/uL (ref 0.0–0.1)
Basophils Relative: 0 %
Eosinophils Absolute: 0.1 10*3/uL (ref 0.0–0.5)
Eosinophils Relative: 1 %
HCT: 25.9 % — ABNORMAL LOW (ref 36.0–46.0)
Hemoglobin: 8.7 g/dL — ABNORMAL LOW (ref 12.0–15.0)
Immature Granulocytes: 0 %
Lymphocytes Relative: 30 %
Lymphs Abs: 2.2 10*3/uL (ref 0.7–4.0)
MCH: 29.3 pg (ref 26.0–34.0)
MCHC: 33.6 g/dL (ref 30.0–36.0)
MCV: 87.2 fL (ref 80.0–100.0)
Monocytes Absolute: 0.6 10*3/uL (ref 0.1–1.0)
Monocytes Relative: 8 %
Neutro Abs: 4.3 10*3/uL (ref 1.7–7.7)
Neutrophils Relative %: 61 %
Platelets: 253 10*3/uL (ref 150–400)
RBC: 2.97 MIL/uL — ABNORMAL LOW (ref 3.87–5.11)
RDW: 13.9 % (ref 11.5–15.5)
WBC: 7.2 10*3/uL (ref 4.0–10.5)
nRBC: 0 % (ref 0.0–0.2)

## 2022-06-16 LAB — URINALYSIS, ROUTINE W REFLEX MICROSCOPIC
Bilirubin Urine: NEGATIVE
Glucose, UA: NEGATIVE mg/dL
Hgb urine dipstick: NEGATIVE
Ketones, ur: NEGATIVE mg/dL
Nitrite: NEGATIVE
Protein, ur: NEGATIVE mg/dL
Specific Gravity, Urine: 1.015 (ref 1.005–1.030)
pH: 6 (ref 5.0–8.0)

## 2022-06-16 MED ORDER — METHYLPREDNISOLONE SODIUM SUCC 125 MG IJ SOLR: 40.0000 mg | Freq: Once | INTRAMUSCULAR | Status: DC

## 2022-06-16 MED ORDER — PREDNISONE 10 MG PO TABS: ORAL_TABLET | ORAL | 0 refills | Status: AC

## 2022-06-16 MED ORDER — METHYLPREDNISOLONE SODIUM SUCC 125 MG IJ SOLR
60.0000 mg | Freq: Once | INTRAMUSCULAR | Status: AC
  Administered 2022-06-16: 60 mg via INTRAMUSCULAR
  Filled 2022-06-16: qty 2

## 2022-06-16 NOTE — Telephone Encounter (Signed)
Jennifer Cain this pt has called to report abd pain 8/10 and black stools.  She is currently pregnant.  Do you want her to go to the ED due to the severity or should she call OB?  Please advise

## 2022-06-16 NOTE — MAU Note (Signed)
.  Jennifer Cain is a 26 y.o. at 78w4dhere in MAU reporting: she has chrones disease. C/o upper abd pain on and off for 1 week. Stated t feels like the pain she has when her chrones flares up but unsure since she I pregnant. Stated she had a black stool today. She called her GI doctor and he told her to come to the emergency room to get it checked out.  LMP:  denies any vag bleeding or leaking . Does reports she has decreased fetal movement not felt baby move much at all today.  Onset of complaint: 1 week Pain score: 8 Vitals:   06/16/22 1707  BP: 128/84  Pulse: 97  Resp: 18  Temp: 97.9 F (36.6 C)     FHT:133 Lab orders placed from triage:   U/A

## 2022-06-16 NOTE — Telephone Encounter (Signed)
Tried to call pt and no answer and no voice mail.  Will send a My Chart message

## 2022-06-16 NOTE — Telephone Encounter (Signed)
See alternate note per Nicoletta Ba PA regarding ED recommendation.

## 2022-06-16 NOTE — Telephone Encounter (Signed)
Contacted by Danelle Berry, emergency room provider in the maternal ER. Patient presenting there with 3 to 4 days of worsening abdominal pain and an episode of a solid black stool earlier this morning.  Maternal-fetal medicine has ruled out a pregnancy related cause for her abdominal pain and are asking for advice regarding treatment of what is felt likely to be a Crohn's flare  She was seen in our office after having been somewhat lost to follow-up 1 month ago by Nicoletta Ba, PA-C.  Her hemoglobin is 0.7 g/dL lower than it was 2 days ago but she has had no diarrhea or black stool or any stool since being evaluated in the emergency department.  Vital signs are stable.  ER provider feels patient can be discharged home with following plan:  I recommended obstetrics team check CBC Monday or Tuesday next week to ensure stability. Avoid all NSAIDs We will do a rather short prednisone taper after getting a single dose of IV Solu-Medrol in the ER.  Prednisone 40 mg x 7 days, 30 mg x 7 days, 20 mg x 7 days and 10 mg x 7 days. Bentyl 10 to 20 mg can be used 3 times daily as needed if okay with OB/GYN team  Patient should be advised if she has further black stools, worsening abdominal pain, fever or chills, presyncopal symptoms to return to the emergency department immediately.  We can see if patient could be seen by an APP in our office within the next 1 to 2 weeks, or Dr. Bryan Lemma should he have an appointment available.

## 2022-06-16 NOTE — MAU Provider Note (Signed)
Chief Complaint:  Abdominal Pain and Melena   Event Date/Time   First Provider Initiated Contact with Patient 06/16/22 1807      HPI: Jennifer Cain is a 26 y.o. G1P0000 at 53w4dby midtrimester ultrasound who presents to maternity admissions reporting onset of abdominal cramping and darkening stools similar to previous Crohn Disease flareups, symptoms beginning 2-3 days ago.  The pain is intermittent, sharp and pressure pain in her lower abdomen, and it is gradually worsening. This morning her stool was not just darker but was black.  She sees [facility]  She is not currently on any medications for the condition.  She reports good fetal movement, denies LOF, vaginal bleeding, vaginal itching/burning, urinary symptoms, h/a, dizziness, n/v, or fever/chills.     HPI  Past Medical History: Past Medical History:  Diagnosis Date   Crohn disease ([place]     Past obstetric history: OB History  Gravida Para Term Preterm AB Living  1 0 0   0 0  SAB IAB Ectopic Multiple Live Births               # Outcome Date GA Lbr Len/2nd Weight Sex Delivery Anes PTL Lv  1 Current             Past Surgical History: Past Surgical History:  Procedure Laterality Date   BIOPSY  07/20/2018   Procedure: BIOPSY;  Surgeon: HCarol Ada MD;  [facility]: [facility];  Service: Endoscopy;;   BIOPSY  05/04/2021   Procedure: BIOPSY;  Surgeon: BThornton Park MD;  Location: [facility];  Service: Gastroenterology;;   COLONOSCOPY WITH PROPOFOL N/A 07/20/2018   Procedure: COLONOSCOPY WITH PROPOFOL;  Surgeon: HCarol Ada MD;  Location[facility];  Service: Endoscopy;  Laterality: N/A;   COLONOSCOPY WITH PROPOFOL N/A 05/04/2021   Procedure: COLONOSCOPY WITH PROPOFOL;  Surgeon: BThornton Park MD;  Location[facility];  Service: Gastroenterology;  Laterality: N/A;   ESOPHAGOGASTRODUODENOSCOPY (EGD) WITH PROPOFOL N/A 07/20/2018   Procedure: ESOPHAGOGASTRODUODENOSCOPY (EGD) WITH PROPOFOL;   Surgeon: HCarol Ada MD;  [facility];  Service: Endoscopy;  Laterality: N/A;   ESOPHAGOGASTRODUODENOSCOPY (EGD) WITH PROPOFOL N/A 05/04/2021   Procedure: ESOPHAGOGASTRODUODENOSCOPY (EGD) WITH PROPOFOL;  Surgeon: BThornton Park MD;  [facility];  Service: Gastroenterology;  Laterality: N/A;    Family History: Family History  Problem Relation Age of Onset   GI Disease Neg Hx    Autoimmune disease Neg Hx     Social History: Social History   Tobacco Use   Smoking status: Never    Passive exposure: Never   Smokeless tobacco: Never  Vaping Use   Vaping Use: Never used  Substance Use Topics   Alcohol use: Never   Drug use: Never    Allergies: No Known Allergies  Meds:  Medications Prior to Admission  Medication Sig Dispense Refill Last Dose   ferrous sulfate 325 (65 FE) MG EC tablet Take 1 tablet (325 mg total) by mouth every other day. 30 tablet 2 06/16/2022   Prenatal Vit-Fe Fumarate-FA (MULTIVITAMIN-PRENATAL) 27-0.8 MG TABS tablet Take 1 tablet by mouth daily 60 tablet 0 06/16/2022   metoCLOPramide (REGLAN) 10 MG tablet Take 1 tablet (10 mg total) by mouth every 6 (six) hours as needed (nausea/headache). 12 tablet 0     ROS:  Review of Systems  Constitutional:  Negative for chills, fatigue and fever.  Eyes:  Negative for visual disturbance.  Respiratory:  Negative for shortness of breath.   Cardiovascular:  Negative for chest pain.  Gastrointestinal:  Positive for abdominal pain and blood in stool. Negative for nausea and vomiting.  Genitourinary:  Negative for difficulty urinating, dysuria, flank pain, pelvic pain, vaginal bleeding, vaginal discharge and vaginal pain.  Neurological:  Negative for dizziness and headaches.  Psychiatric/Behavioral: Negative.       I have reviewed patient's Past Medical Hx, Surgical Hx, Family Hx, Social Hx, medications and allergies.   Physical Exam  Patient Vitals for the past 24 hrs:  BP Temp Pulse Resp  Height Weight  06/16/22 1707 128/84 97.9 F (36.6 C) 97 18 5' 3"  (1.6 m) 59.4 kg   Constitutional: Well-developed, well-nourished female in no acute distress.  Cardiovascular: normal rate Respiratory: normal effort GI: Abd soft, non-tender, gravid appropriate for gestational age.  MS: Extremities nontender, no edema, normal ROM Neurologic: Alert and oriented x 4.  GU: Neg CVAT.  PELVIC EXAM: Deferred  Dilation: Closed Effacement (%): Thick Cervical Position: Posterior Exam by:: Fatima Blank, CNM  FHT:  Baseline 135, moderate variability, accelerations present, no decelerations Contractions: none on toco or to palpation   Labs: Results for orders placed or performed during the hospital encounter of 06/16/22 (from the past 24 hour(s))  Urinalysis, Routine w reflex microscopic Urine, Clean Catch     Status: Abnormal   Collection Time: 06/16/22  5:12 PM  Result Value Ref Range   Color, Urine YELLOW YELLOW   APPearance HAZY (A) CLEAR   Specific Gravity, Urine 1.015 1.005 - 1.030   pH 6.0 5.0 - 8.0   Glucose, UA NEGATIVE NEGATIVE mg/dL   Hgb urine dipstick NEGATIVE NEGATIVE   Bilirubin Urine NEGATIVE NEGATIVE   Ketones, ur NEGATIVE NEGATIVE mg/dL   Protein, ur NEGATIVE NEGATIVE mg/dL   Nitrite NEGATIVE NEGATIVE   Leukocytes,Ua SMALL (A) NEGATIVE   RBC / HPF 11-20 0 - 5 RBC/hpf   WBC, UA 6-10 0 - 5 WBC/hpf   Bacteria, UA RARE (A) NONE SEEN   Squamous Epithelial / LPF 21-50 0 - 5   Mucus PRESENT   CBC with Differential/Platelet     Status: Abnormal   Collection Time: 06/16/22  7:33 PM  Result Value Ref Range   WBC 7.2 4.0 - 10.5 K/uL   RBC 2.97 (L) 3.87 - 5.11 MIL/uL   Hemoglobin 8.7 (L) 12.0 - 15.0 g/dL   HCT 25.9 (L) 36.0 - 46.0 %   MCV 87.2 80.0 - 100.0 fL   MCH 29.3 26.0 - 34.0 pg   MCHC 33.6 30.0 - 36.0 g/dL   RDW 13.9 11.5 - 15.5 %   Platelets 253 150 - 400 K/uL   nRBC 0.0 0.0 - 0.2 %   Neutrophils Relative % 61 %   Neutro Abs 4.3 1.7 - 7.7 K/uL    Lymphocytes Relative 30 %   Lymphs Abs 2.2 0.7 - 4.0 K/uL   Monocytes Relative 8 %   Monocytes Absolute 0.6 0.1 - 1.0 K/uL   Eosinophils Relative 1 %   Eosinophils Absolute 0.1 0.0 - 0.5 K/uL   Basophils Relative 0 %   Basophils Absolute 0.0 0.0 - 0.1 K/uL   Immature Granulocytes 0 %   Abs Immature Granulocytes 0.02 0.00 - 0.07 K/uL   O/Positive/-- (03/22 1123)  Imaging:   MAU Course/MDM: Orders Placed This Encounter  Procedures   Urinalysis, Routine w reflex microscopic Urine, Clean Catch   CBC with Differential/Platelet   Discharge patient    Meds ordered this encounter  Medications   DISCONTD: methylPREDNISolone sodium succinate (SOLU-MEDROL) 125 mg/2 mL  injection 40 mg    IV methylprednisolone will be converted to either a q12h or q24h frequency with the same total daily dose (TDD).  Ordered Dose: 1 to 125 mg TDD; convert to: TDD q24h.  Ordered Dose: 126 to 250 mg TDD; convert to: TDD div q12h.  Ordered Dose: >250 mg TDD; DAW.   predniSONE (DELTASONE) 10 MG tablet    Sig: Take 4 tablets (40 mg total) by mouth daily with breakfast for 7 days, THEN 3 tablets (30 mg total) daily with breakfast for 7 days, THEN 2 tablets (20 mg total) daily with breakfast for 7 days, THEN 1 tablet (10 mg total) daily with breakfast for 7 days.    Dispense:  70 tablet    Refill:  0    Order Specific Question:   Supervising Provider    Answer:   CONSTANT, PEGGY [[id number]]   methylPREDNISolone sodium succinate (SOLU-MEDROL) 125 mg/2 mL injection 60 mg     NST reviewed and appropriate for gestational age No contractions on toco, cervix closed/thick/high, no evidence of PTL  Without obstetric causes for pt symptoms, consult on call GI, Zenovia Jarred, MD. CBC with hgb 8.7, down slightly from 9.5 on 06/14/22 Pt stable, initiate steroids with IM solumedrol then 30 day taper per Dr Hilarie Fredrickson.  Message sent to [facility] for lab only visit on 7/31 or 8/1 to recheck hgb.   Letter provided for pt to be out of work 7/29  and 7/30, return on 7/31.   Return to MAU for worsening symptoms, keep scheduled prenatal appointments in addition to labs next week   Assessment: 1. Exacerbation of Crohn's disease with rectal bleeding ([place])   2. Anemia due to GI blood loss   3. [redacted] weeks gestation of pregnancy   4. Blood type, Rh positive     Plan: Discharge home Labor precautions and fetal kick counts  Follow-up [place] [facility] [place] at [facility] Follow up.   Specialty: Obstetrics and Gynecology Why: Follow up on Monday or Tuesday, 7/31 or 8/1 for repeat labs.  Keep scheduled prenatal appointments. Contact information: [address] [place] [address] [phone]        [facility] Follow up.   Specialty: Obstetrics and Gynecology Why: As needed for emergencies Contact information: [address] [id number] [place] [phone]               Allergies as of 06/16/2022   No Known Allergies      Medication List     TAKE these medications    ferrous sulfate 325 (65 FE) MG EC tablet Take 1 tablet (325 mg total) by mouth every other day.   metoCLOPramide 10 MG tablet Commonly known as: Reglan Take 1 tablet (10 mg total) by mouth every 6 (six) hours as needed (nausea/headache).   multivitamin-prenatal 27-0.8 MG Tabs tablet Take 1 tablet by mouth daily   predniSONE 10 MG tablet Commonly known as: DELTASONE Take 4 tablets (40 mg total) by mouth daily with breakfast for 7 days, THEN 3 tablets (30 mg total) daily with breakfast for 7 days, THEN 2 tablets (20 mg total) daily with breakfast for 7 days, THEN 1 tablet (10 mg total) daily with breakfast for 7 days. Start taking on: June 16, 2022        Fatima Blank Certified Nurse-Midwife 06/16/2022 8:46 PM

## 2022-06-19 ENCOUNTER — Other Ambulatory Visit: Payer: Medicaid Other

## 2022-06-19 ENCOUNTER — Other Ambulatory Visit: Payer: Self-pay

## 2022-06-19 DIAGNOSIS — O99013 Anemia complicating pregnancy, third trimester: Secondary | ICD-10-CM

## 2022-06-19 LAB — CBC
Hematocrit: 29.1 % — ABNORMAL LOW (ref 34.0–46.6)
Hemoglobin: 9.6 g/dL — ABNORMAL LOW (ref 11.1–15.9)
MCH: 28.9 pg (ref 26.6–33.0)
MCHC: 33 g/dL (ref 31.5–35.7)
MCV: 88 fL (ref 79–97)
Platelets: 242 10*3/uL (ref 150–450)
RBC: 3.32 x10E6/uL — ABNORMAL LOW (ref 3.77–5.28)
RDW: 13.6 % (ref 11.7–15.4)
WBC: 7.1 10*3/uL (ref 3.4–10.8)

## 2022-06-19 NOTE — Telephone Encounter (Signed)
Thanks for taking care of her in my absence. Mickel Baas, can we please call to check in on her. I see she has an appt already scheduled with me for 8/15. Depending on sxs and availability, can discuss keeping that appt vs finding an expedited spot. Thanks

## 2022-06-19 NOTE — Telephone Encounter (Signed)
Left message on machine to call back  

## 2022-06-20 NOTE — Telephone Encounter (Signed)
Attempted to contact pt but was unable to leave voicemail.

## 2022-06-26 NOTE — Telephone Encounter (Signed)
Spoke with pt and she stated she felt like she was doing better with the steroids and wanted to keep 8/15 appt time.

## 2022-06-28 NOTE — Progress Notes (Deleted)
    PRENATAL VISIT NOTE  Subjective:  Jennifer Cain is a 26 y.o. G1P0000 at 61w2dbeing seen today for ongoing prenatal care.  She is currently monitored for the following issues for this high-risk pregnancy and has Enteritis-Chronic; FTT (failure to thrive) in adult; Protein calorie malnutrition ([place]; Chronic diarrhea; Nausea & vomiting-chronic; Normocytic anemia; Generalized weakness; Thrombocytosis; Hypokalemia; Malnutrition of moderate degree; Hypomagnesemia; Crohn's disease of small intestine (HFells[place]; Failure to thrive in adult; Colitis; Ileitis; Anemia due to GI blood loss; Supervision of other normal pregnancy, antepartum; Fetal growth restriction antepartum- resolved; and Anemia of pregnancy on their problem list.  Patient reports  was seen 7/28 in the MAU for abdominal pain and bleeding. Was treated for acute chron's flare with steroids (Solumedrol) . Plan for outpatient management Short prednisone taper after getting a single dose of IV Solu-Medrol in the ER.  Prednisone 40 mg x 7 days, 30 mg x 7 days, 20 mg x 7 days and 10 mg x 7 days. Bentyl 10 to 20 mg can be used 3 times daily  Plan was for her to be seen by GI in 2 wks-- has appt scheduled for 8/15     .  .   . Denies leaking of fluid.   The following portions of the patient's history were reviewed and updated as appropriate: allergies, current medications, past family history, past medical history, past social history, past surgical history and problem list.   Objective:  There were no vitals filed for this visit.  Fetal Status:           General:  Alert, oriented and cooperative. Patient is in no acute distress.  Skin: Skin is warm and dry. No rash noted.   Cardiovascular: Normal heart rate noted  Respiratory: Normal respiratory effort, no problems with respiration noted  Abdomen: Soft, gravid, appropriate for gestational age.        Pelvic: {Blank single:19197::"Cervical exam performed in the presence of a  chaperone","Cervical exam deferred"}        Extremities: Normal range of motion.     Mental Status: Normal mood and affect. Normal behavior. Normal judgment and thought content.   Assessment and Plan:  Pregnancy: G1P0000 at 247w2d. Anemia during pregnancy in third trimester ***  2. Supervision of other normal pregnancy, antepartum 3rd trimester labs were WNL  3. Crohn's disease of small intestine without complication (HCC) ***  {Blank single:19197::"Term","Preterm"} labor symptoms and general obstetric precautions including but not limited to vaginal bleeding, contractions, leaking of fluid and fetal movement were reviewed in detail with the patient. Please refer to After Visit Summary for other counseling recommendations.   No follow-ups on file.  Future Appointments  Date Time Provider DeGuayanilla8/08/2022  1:35 PM NeCaren MacadamMD WMMille Lacs Health SystemML[facility]8/15/2023 10:40 AM CiLavena BullionDO LBGI-GI LBCentura Health-[facility]8/23/2023  9:55 AM EcClarnce FlockMD WMSol[facility] Hospital HarlingenMBirmingham [facility]8/30/2023 10:30 AM WMC-MFC NURSE WMC-MFC [facility]8/30/2023 10:45 AM WMC-MFC US5 WMC-MFCUS [facility]Ho[facility]9/04/2022 10:35 AM NeCaren MacadamMD WMStewart Memorial Community HospitalMMck[facility]9/28/2023  1:15 PM PrDonnamae JudeMD WMEastern Shore Endoscopy LLCMNortheastern Health System10/02/2022  2:35 PM EcClarnce FlockMD WMKohala HospitalMR[facility]10/10/2022 10:35 AM PrDonnamae JudeMD WMAlvar[facility]MM[facility]10/18/2023  9:15 AM EcClarnce FlockMD WMHancock Regional Surgery Center LLCMSt[facility]10/23/2023 10:15 AM WMC-WOCA NST WMValley Behavioral Health SystemMMontpel[facility]10/23/2023 10:55 AM NeCaren MacadamMD WMDeer Creek Surgery Center LLCMWest[facility]  KiCaren MacadamMD

## 2022-06-29 ENCOUNTER — Encounter: Payer: Medicaid Other | Admitting: Family Medicine

## 2022-06-29 DIAGNOSIS — K5000 Crohn's disease of small intestine without complications: Secondary | ICD-10-CM

## 2022-06-29 DIAGNOSIS — O99013 Anemia complicating pregnancy, third trimester: Secondary | ICD-10-CM

## 2022-06-29 DIAGNOSIS — Z3480 Encounter for supervision of other normal pregnancy, unspecified trimester: Secondary | ICD-10-CM

## 2022-07-04 ENCOUNTER — Ambulatory Visit: Payer: Medicaid Other | Admitting: Gastroenterology

## 2022-07-11 ENCOUNTER — Encounter: Payer: Self-pay | Admitting: Family Medicine

## 2022-07-12 ENCOUNTER — Telehealth (INDEPENDENT_AMBULATORY_CARE_PROVIDER_SITE_OTHER): Payer: Medicaid Other | Admitting: Family Medicine

## 2022-07-12 ENCOUNTER — Encounter: Payer: Self-pay | Admitting: Family Medicine

## 2022-07-12 DIAGNOSIS — Z3480 Encounter for supervision of other normal pregnancy, unspecified trimester: Secondary | ICD-10-CM

## 2022-07-12 DIAGNOSIS — O99613 Diseases of the digestive system complicating pregnancy, third trimester: Secondary | ICD-10-CM

## 2022-07-12 DIAGNOSIS — K5000 Crohn's disease of small intestine without complications: Secondary | ICD-10-CM

## 2022-07-12 DIAGNOSIS — O365930 Maternal care for other known or suspected poor fetal growth, third trimester, not applicable or unspecified: Secondary | ICD-10-CM

## 2022-07-12 DIAGNOSIS — O99013 Anemia complicating pregnancy, third trimester: Secondary | ICD-10-CM

## 2022-07-12 DIAGNOSIS — O365990 Maternal care for other known or suspected poor fetal growth, unspecified trimester, not applicable or unspecified: Secondary | ICD-10-CM

## 2022-07-12 DIAGNOSIS — Z3A31 31 weeks gestation of pregnancy: Secondary | ICD-10-CM

## 2022-07-12 NOTE — Patient Instructions (Signed)

## 2022-07-12 NOTE — Progress Notes (Signed)
I connected with  Jennifer Cain on 07/12/22 by a video enabled telemedicine application and verified that I am speaking with the correct person using two identifiers.   I discussed the limitations of evaluation and management by telemedicine. The patient expressed understanding and agreed to proceed.   Spent a total 5 minutes with patient.  No issues or concerns with patient at this time.   Altha Harm, CMA

## 2022-07-12 NOTE — Progress Notes (Signed)
I connected with Jennifer Cain 07/12/22 at  9:55 AM EDT by: MyChart video and verified that I am speaking with the correct person using two identifiers.  Patient is located at home and provider is located at [facility].     I discussed the limitations, risks, security and privacy concerns of performing an evaluation and management service by MyChart video and the availability of in person appointments. I also discussed with the patient that there may be a patient responsible charge related to this service. By engaging in this virtual visit, you consent to the provision of healthcare.  Additionally, you authorize for your insurance to be billed for the services provided during this visit.  The patient expressed understanding and agreed to proceed.  The following staff members participated in the virtual visit:  Clarnce Flock, MD/MPH Attending Family Medicine Physician, [place] for [facility], [place] VISIT NOTE  Subjective:  Jennifer Cain is a 26 y.o. G1P0000 at [redacted]w[redacted]d for virtual visit for ongoing prenatal care.  She is currently monitored for the following issues for this high-risk pregnancy and has Enteritis-Chronic; FTT (failure to thrive) in adult; Protein calorie malnutrition ([place]; Chronic diarrhea; Nausea & vomiting-chronic; Normocytic anemia; Generalized weakness; Thrombocytosis; Hypokalemia; Malnutrition of moderate degree; Hypomagnesemia; Crohn's disease of small intestine (HEdm[place]; Failure to thrive in adult; Colitis; Ileitis; Anemia due to GI blood loss; Supervision of other normal pregnancy, antepartum; Fetal growth restriction antepartum- resolved; and Anemia of pregnancy on their problem list.  Patient reports no complaints.  Contractions: Irritability. Vag. Bleeding: None.  Movement: Present. Denies leaking of fluid.   The following portions of the patient's history were reviewed and updated as appropriate: allergies,  current medications, past family history, past medical history, past social history, past surgical history and problem list.   Objective:  There were no vitals filed for this visit. Self-Obtained  Fetal Status:     Movement: Present     Assessment and Plan:  Pregnancy: G1P0000 at 375w2d. Supervision of other normal pregnancy, antepartum Reports good fetal movement  2. Fetal growth restriction antepartum- resolved Last growth US[place]9%, has follow up scan with MFM on 07/19/2022  3. Crohn's disease of small intestine without complication (HCTakotnaRecent flare diagnosed in MAU and started on steroids No showed GI visit on 07/04/2022 Having transportation issues Asked her to call GI to reschedule the appointment and informed her of Medicaid transportation services with need to schedule at least 48h in advance  4. Anemia during pregnancy in third trimester On PO iron Lab Results  Component Value Date   HGB 9.6 (L) 06/19/2022     Preterm labor symptoms and general obstetric precautions including but not limited to vaginal bleeding, contractions, leaking of fluid and fetal movement were reviewed in detail with the patient.  Return in 2 weeks (on 07/26/2022) for [place] patient, ob visit.  Future Appointments  Date Time Provider DeWhitehorse8/30/2023 10:30 AM WMMer[facility]URSE WMChrist[facility] HospitalM[facility]8/30/2023 10:45 AM WMC-MFC US5 WMC-MFCUS WMS[facility]9/04/2022 10:35 AM NeCaren MacadamMD WME[facility] LLCMNort[facility]9/28/2023  1:15 PM PrDonnamae JudeMD WMHealth[facility],LLCMClement [facility]10/02/2022  2:35 PM EcClarnce FlockMD WMIndiana University Health TransplantMNess [facility]10/10/2022 10:35 AM PrDonnamae JudeMD WMEll[facility] DivisionMFreeman [facility]10/18/2023  9:15 AM EcClarnce FlockMD WMYoung [facility] InstituteMCenter [facility]10/23/2023 10:15 AM WMC-WOCA NST WMAvera Saint Benedict Health CenterMPalms [facility] Pas[facility]10/23/2023 10:55 AM NeCaren MacadamMD WMDover Emergency RoomMHot [facility] Rehabilitation [facility]  Time spent on virtual visit: 10 minutes  Clarnce Flock, MD

## 2022-07-19 ENCOUNTER — Ambulatory Visit: Payer: Medicaid Other | Attending: Maternal & Fetal Medicine

## 2022-07-19 ENCOUNTER — Other Ambulatory Visit: Payer: Self-pay | Admitting: *Deleted

## 2022-07-19 ENCOUNTER — Ambulatory Visit: Payer: Medicaid Other | Admitting: *Deleted

## 2022-07-19 VITALS — BP 117/68 | HR 89

## 2022-07-19 DIAGNOSIS — O0992 Supervision of high risk pregnancy, unspecified, second trimester: Secondary | ICD-10-CM | POA: Insufficient documentation

## 2022-07-19 DIAGNOSIS — O99613 Diseases of the digestive system complicating pregnancy, third trimester: Secondary | ICD-10-CM | POA: Insufficient documentation

## 2022-07-19 DIAGNOSIS — O403XX0 Polyhydramnios, third trimester, not applicable or unspecified: Secondary | ICD-10-CM

## 2022-07-19 DIAGNOSIS — K50919 Crohn's disease, unspecified, with unspecified complications: Secondary | ICD-10-CM | POA: Insufficient documentation

## 2022-07-19 DIAGNOSIS — K5090 Crohn's disease, unspecified, without complications: Secondary | ICD-10-CM | POA: Insufficient documentation

## 2022-07-19 DIAGNOSIS — Z3A32 32 weeks gestation of pregnancy: Secondary | ICD-10-CM

## 2022-07-19 DIAGNOSIS — O409XX0 Polyhydramnios, unspecified trimester, not applicable or unspecified: Secondary | ICD-10-CM

## 2022-07-24 ENCOUNTER — Other Ambulatory Visit: Payer: Self-pay

## 2022-07-24 ENCOUNTER — Encounter (HOSPITAL_BASED_OUTPATIENT_CLINIC_OR_DEPARTMENT_OTHER): Payer: Self-pay | Admitting: Emergency Medicine

## 2022-07-24 DIAGNOSIS — K644 Residual hemorrhoidal skin tags: Secondary | ICD-10-CM | POA: Insufficient documentation

## 2022-07-24 DIAGNOSIS — K6289 Other specified diseases of anus and rectum: Secondary | ICD-10-CM | POA: Diagnosis present

## 2022-07-24 NOTE — ED Triage Notes (Signed)
Patient arrived via POV c/o rectal pain x 3 days w/o bleeding in pregnancy. Patient states [redacted] wk pregnant. Patient states 10/10 pain. Patient is AO x 4, VS WDL, slow gait.

## 2022-07-25 ENCOUNTER — Encounter (HOSPITAL_BASED_OUTPATIENT_CLINIC_OR_DEPARTMENT_OTHER): Payer: Self-pay

## 2022-07-25 ENCOUNTER — Emergency Department (HOSPITAL_BASED_OUTPATIENT_CLINIC_OR_DEPARTMENT_OTHER)
Admission: EM | Admit: 2022-07-25 | Discharge: 2022-07-25 | Disposition: A | Discharge status: Home / Self Care | Payer: Medicaid Other | Attending: Emergency Medicine | Admitting: Emergency Medicine

## 2022-07-25 DIAGNOSIS — K644 Residual hemorrhoidal skin tags: Secondary | ICD-10-CM

## 2022-07-25 MED ORDER — HYDROCORTISONE ACETATE 25 MG RE SUPP
25.0000 mg | Freq: Once | RECTAL | Status: AC
  Administered 2022-07-25: 25 mg via RECTAL
  Filled 2022-07-25: qty 1

## 2022-07-25 MED ORDER — HYDROCORTISONE ACETATE 25 MG RE SUPP: 25.0000 mg | Freq: Two times a day (BID) | RECTAL | 0 refills | Status: DC

## 2022-07-25 NOTE — ED Provider Notes (Signed)
[facility] EMERGENCY DEPARTMENT Provider Note   CSN: [id number] Arrival date & time: 07/24/22  2142     History  Chief Complaint  Patient presents with   Rectal Pain    Jennifer Cain is a 26 y.o. female.  The history is provided by the patient and medical records.  Jennifer Cain is a 26 y.o. female who presents to the Emergency Department complaining of rectal pain.  She is a G1 at 33 weeks, due date October 23 here for evaluation of 1 week of rectal pain.  Pain is located at the external rectal region and has been progressively worsening.  Pain is worse with bowel movement.  She had her last bowel movement a few days ago and it was described as firm.  No associate abdominal pain, fever, nausea, vomiting, vaginal bleeding, leakage of fluids.  She is experiencing fetal movement, no contractions.  She sees Dr. Trevor Mace stat with OB.  She does have a history of Crohn's disease and was just treated with a prednisone burst for a Crohn's flare, completed the treatment 2 weeks ago.  She states that this does not feel like her Crohn's.      Home Medications Prior to Admission medications   Medication Sig Start Date End Date Taking? Authorizing Provider  hydrocortisone (ANUSOL-HC) 25 MG suppository Place 1 suppository (25 mg total) rectally 2 (two) times daily. 07/25/22  Yes Quintella Reichert, MD  ferrous sulfate 325 (65 FE) MG EC tablet Take 1 tablet (325 mg total) by mouth every other day. 06/15/22 12/12/22  Clarnce Flock, MD  metoCLOPramide (REGLAN) 10 MG tablet Take 1 tablet (10 mg total) by mouth every 6 (six) hours as needed (nausea/headache). 04/04/22   Molpus, John, MD  Prenatal Vit-Fe Fumarate-FA (MULTIVITAMIN-PRENATAL) 27-0.8 MG TABS tablet Take 1 tablet by mouth daily 01/09/22   Wynona Dove A, DO      Allergies    Patient has no known allergies.    Review of Systems   Review of Systems  All other systems reviewed and are negative.   Physical Exam Updated Vital Signs BP  110/71   Pulse 80   Temp 98.6 F (37 C) (Oral)   Resp 17   Ht 5' 4"  (1.626 m)   Wt 78.8 kg   LMP 11/16/2021 Comment: neg urine preg test on admission  SpO2 100%   BMI 29.82 kg/m  Physical Exam Vitals and nursing note reviewed.  Constitutional:      Appearance: She is well-developed.  HENT:     Head: Normocephalic and atraumatic.  Cardiovascular:     Rate and Rhythm: Normal rate and regular rhythm.  Pulmonary:     Effort: Pulmonary effort is normal. No respiratory distress.  Abdominal:     Tenderness: There is no abdominal tenderness. There is no guarding or rebound.     Comments: Gravid uterus  Genitourinary:    Comments: Small external hemorrhoid that is mildly edematous without any significant erythema.  No thrombosed hemorrhoid.  No significant pain or mass on DRE. Musculoskeletal:        General: No swelling or tenderness.  Skin:    General: Skin is warm and dry.  Neurological:     Mental Status: She is alert and oriented to person, place, and time.  Psychiatric:        Behavior: Behavior normal.     ED Results / Procedures / Treatments   Labs (all labs ordered are listed, but only abnormal results are displayed) Labs  Reviewed - No data to display  EKG None  Radiology No results found.  Procedures Procedures    Medications Ordered in ED Medications  hydrocortisone (ANUSOL-HC) suppository 25 mg (25 mg Rectal Given 07/25/22 0136)    ED Course/ Medical Decision Making/ A&P                           Medical Decision Making Risk Prescription drug management.   Patient is a G1, P0 at 33 weeks here for evaluation of rectal pain.  She does have a history of Crohn's disease and just completed a prednisone course.  On examination she does have 1 external hemorrhoid that is not thrombosed.  No evidence of active infection.  Presentation is not consistent with Crohn's flare.  No evidence of active labor.  Presenting blood pressure was mildly elevated but repeat  blood pressure appropriate.  Discussed with patient home care for hemorrhoids in pregnancy.  She was feeling improved after suppository in the emergency department.  Plan to discharge home with close OB/GYN follow-up.        Final Clinical Impression(s) / ED Diagnoses Final diagnoses:  External hemorrhoids without complication    Rx / DC Orders ED Discharge Orders          Ordered    hydrocortisone (ANUSOL-HC) 25 MG suppository  2 times daily        07/25/22 0214              Quintella Reichert, MD 07/25/22 [phone]

## 2022-07-26 ENCOUNTER — Encounter: Payer: Medicaid Other | Admitting: Family Medicine

## 2022-08-11 IMAGING — CT CT ABD-PELV W/O CM
2 of 4 series · 17 of 46 positions shown, 19 images · non-contrast
Comparison: 10/12/2020

CLINICAL DATA: History of Crohn's disease. Nausea, vomiting,
diarrhea. Left upper quadrant pain

EXAM:
CT ABDOMEN AND PELVIS WITHOUT CONTRAST
TECHNIQUE: Multidetector CT imaging of the abdomen and pelvis was performed
following the standard protocol without IV contrast.

[Series 2: axial st · axial · 0.82mm/px · z∈[-558,-172]mm · 14 of 85 slices shown, 16 images]
[im 4/85  soft-tissue]
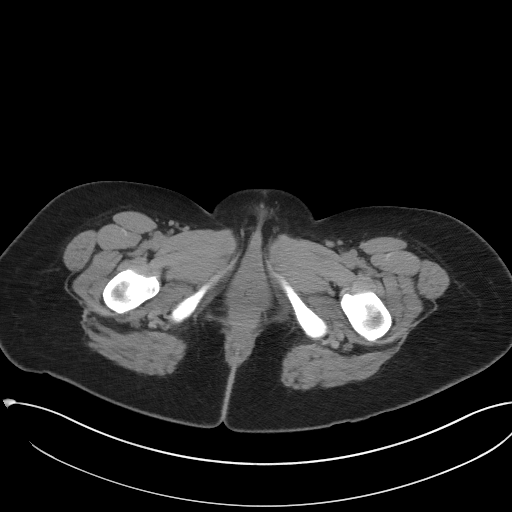
[im 4/85  bone]
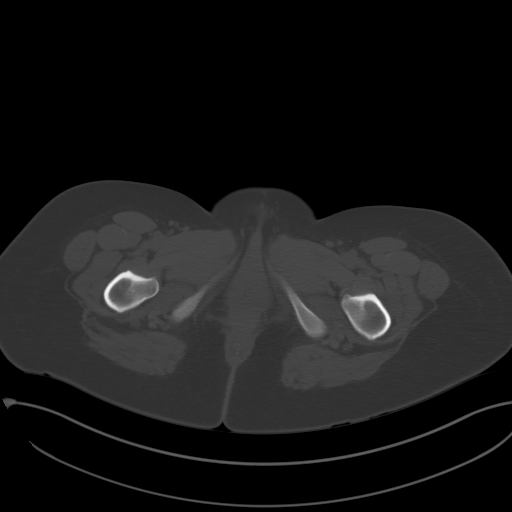
[im 10/85  soft-tissue]
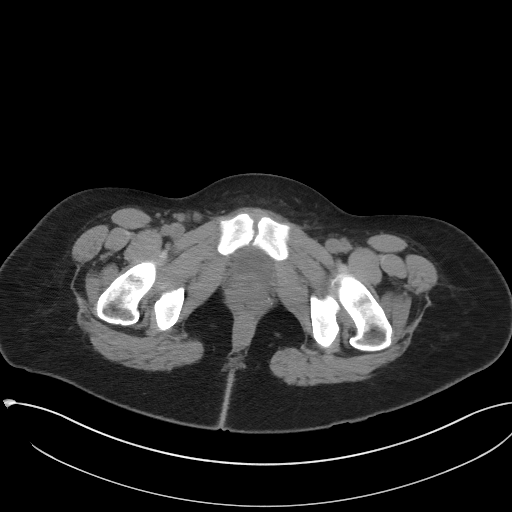
[im 17/85  soft-tissue]
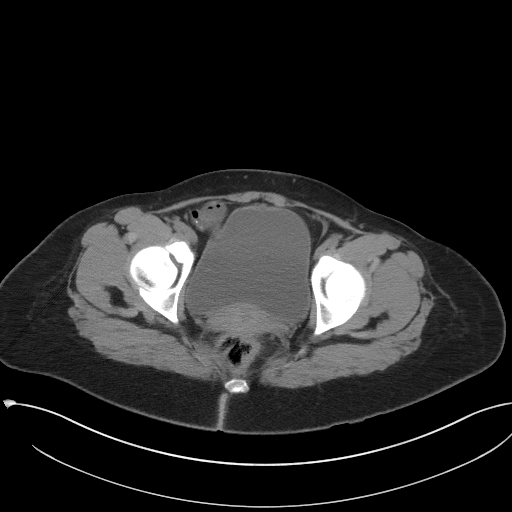
[im 23/85  soft-tissue]
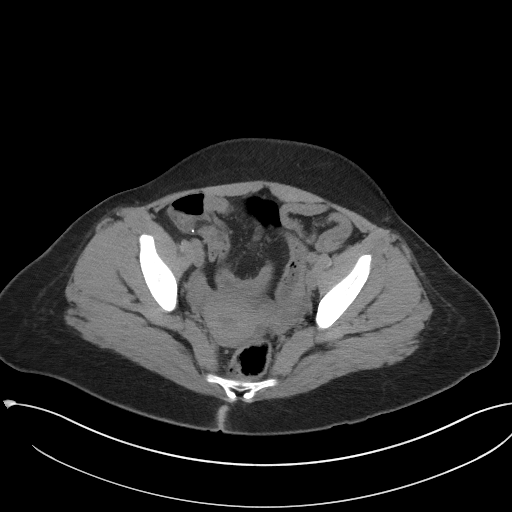
[im 30/85  soft-tissue]
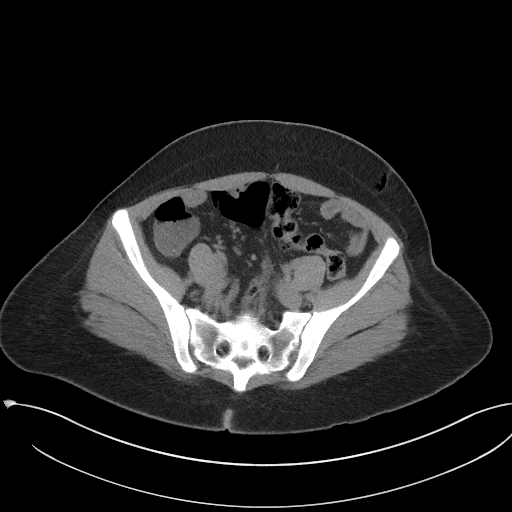
[im 33/85  soft-tissue]
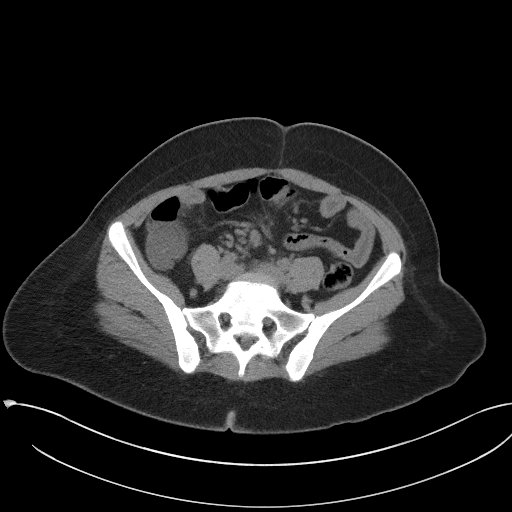
[im 39/85  soft-tissue]
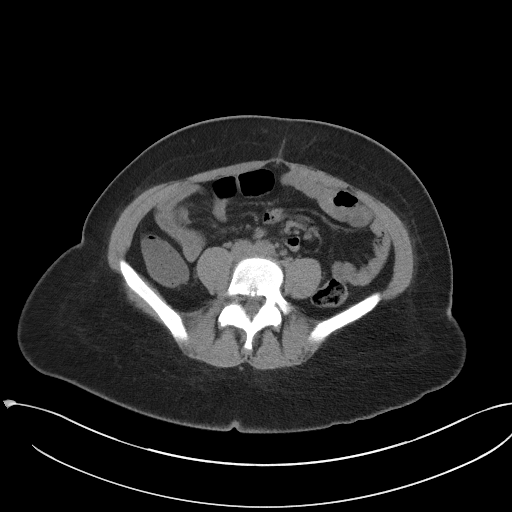
[im 46/85  soft-tissue]
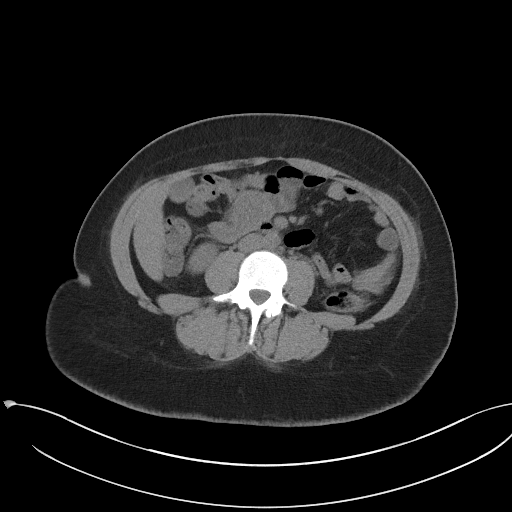
[im 52/85  soft-tissue]
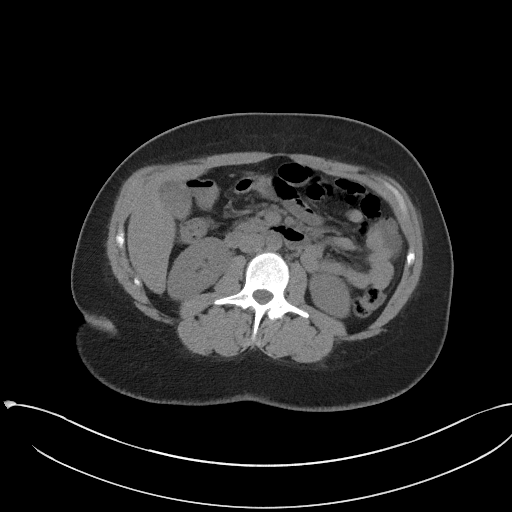
[im 52/85  bone]
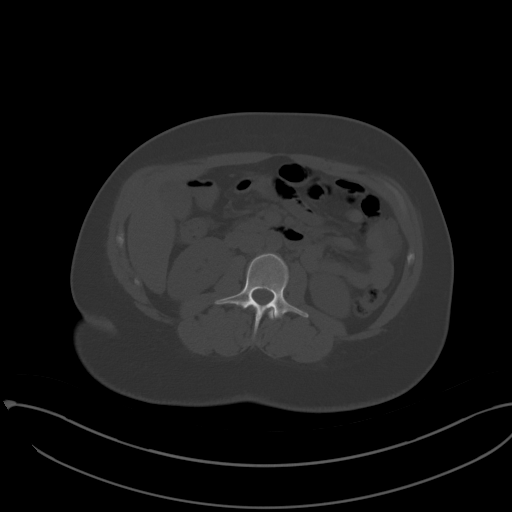
[im 55/85  soft-tissue]
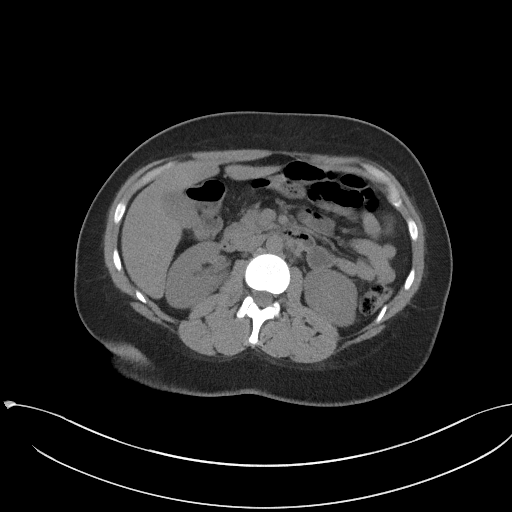
[im 62/85  soft-tissue]
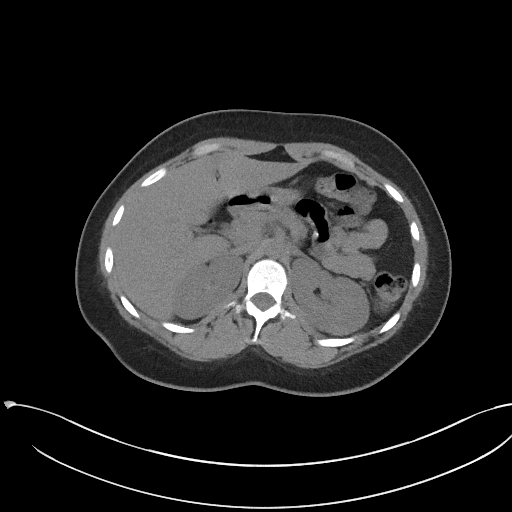
[im 68/85  soft-tissue]
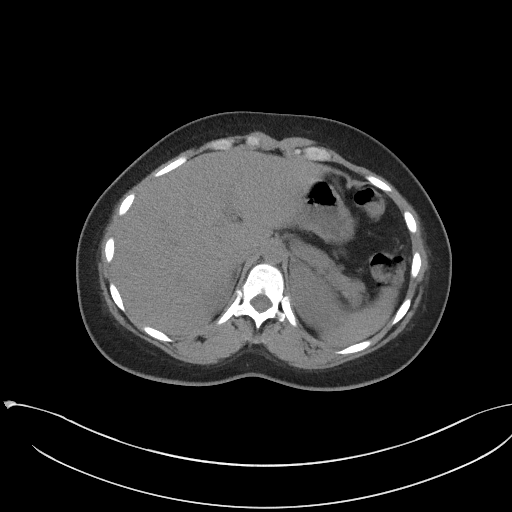
[im 75/85  soft-tissue]
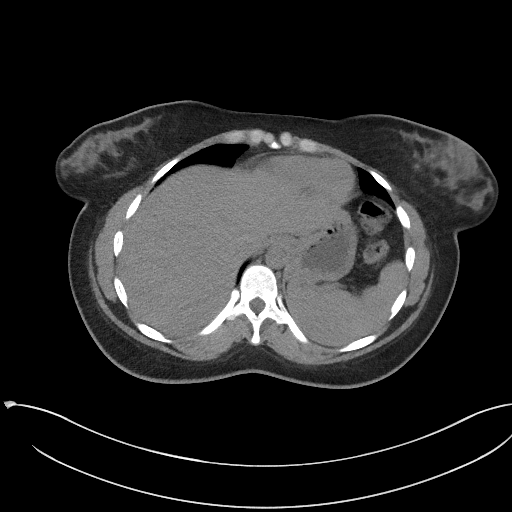
[im 81/85  soft-tissue]
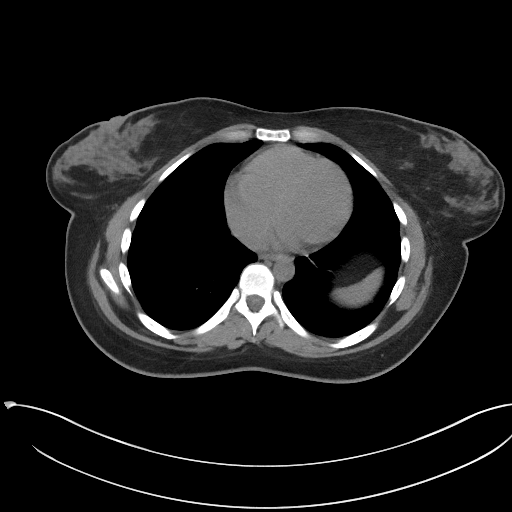

[Series 5: coronal st · coronal · 0.68mm/px · 3 of 101 slices shown]
[im 34/101  soft-tissue]
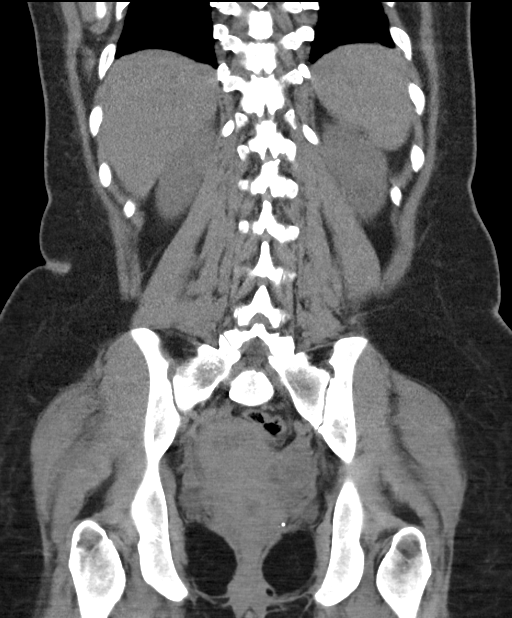
[im 45/101  soft-tissue]
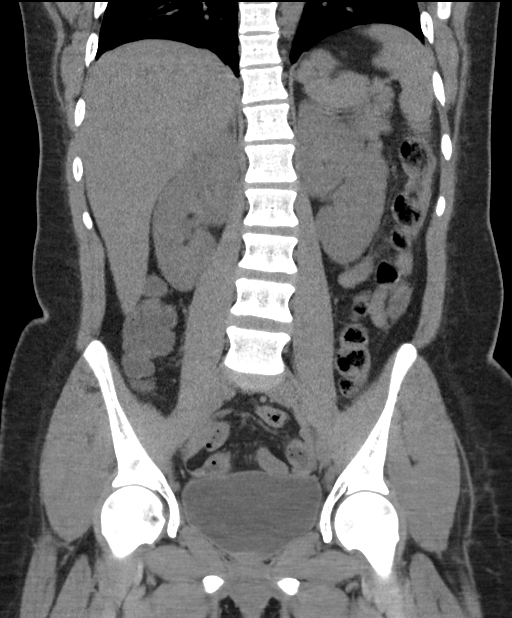
[im 56/101  soft-tissue]
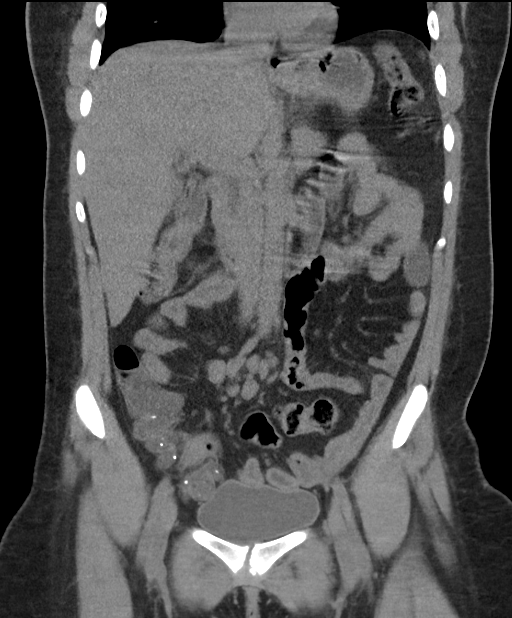

[17 of 46 positions shown; findings below may reference images not displayed]

FINDINGS: Lower chest: Lung bases are clear. No effusions. Heart is normal
size.

Hepatobiliary: No focal hepatic abnormality. Gallbladder
unremarkable.

Pancreas: No focal abnormality or ductal dilatation.

Spleen: No focal abnormality.  Normal size.

Adrenals/Urinary Tract: No adrenal abnormality. No focal renal
abnormality. No stones or hydronephrosis. Urinary bladder is
unremarkable.

Stomach/Bowel: Bowel grossly unremarkable. No visible wall
thickening or inflammatory process. No obstruction.

Vascular/Lymphatic: No evidence of aneurysm or adenopathy.

Reproductive: Uterus and adnexa unremarkable.  No mass.

Other: No free fluid or free air.

Musculoskeletal: No acute bony abnormality.
IMPRESSION: No acute findings in the abdomen or pelvis.

## 2022-08-16 ENCOUNTER — Encounter: Payer: Medicaid Other | Admitting: Family Medicine

## 2022-08-16 ENCOUNTER — Other Ambulatory Visit: Payer: Self-pay | Admitting: Maternal & Fetal Medicine

## 2022-08-16 ENCOUNTER — Ambulatory Visit: Payer: Medicaid Other | Admitting: *Deleted

## 2022-08-16 ENCOUNTER — Other Ambulatory Visit (HOSPITAL_COMMUNITY)
Admission: RE | Admit: 2022-08-16 | Discharge: 2022-08-16 | Disposition: A | Discharge status: Home / Self Care | Payer: Medicaid Other | Source: Ambulatory Visit | Attending: Family Medicine | Admitting: Family Medicine

## 2022-08-16 ENCOUNTER — Encounter: Payer: Self-pay | Admitting: Family Medicine

## 2022-08-16 ENCOUNTER — Other Ambulatory Visit: Payer: Self-pay

## 2022-08-16 ENCOUNTER — Ambulatory Visit: Payer: Medicaid Other | Attending: Maternal & Fetal Medicine

## 2022-08-16 ENCOUNTER — Ambulatory Visit (INDEPENDENT_AMBULATORY_CARE_PROVIDER_SITE_OTHER): Payer: Medicaid Other | Admitting: Family Medicine

## 2022-08-16 VITALS — BP 117/74 | HR 94

## 2022-08-16 VITALS — BP 127/87 | HR 94 | Wt 172.2 lb

## 2022-08-16 DIAGNOSIS — Z3480 Encounter for supervision of other normal pregnancy, unspecified trimester: Secondary | ICD-10-CM | POA: Diagnosis not present

## 2022-08-16 DIAGNOSIS — O403XX0 Polyhydramnios, third trimester, not applicable or unspecified: Secondary | ICD-10-CM

## 2022-08-16 DIAGNOSIS — O409XX0 Polyhydramnios, unspecified trimester, not applicable or unspecified: Secondary | ICD-10-CM | POA: Insufficient documentation

## 2022-08-16 DIAGNOSIS — Z3483 Encounter for supervision of other normal pregnancy, third trimester: Secondary | ICD-10-CM

## 2022-08-16 DIAGNOSIS — O99613 Diseases of the digestive system complicating pregnancy, third trimester: Secondary | ICD-10-CM | POA: Diagnosis not present

## 2022-08-16 DIAGNOSIS — Z3A36 36 weeks gestation of pregnancy: Secondary | ICD-10-CM | POA: Diagnosis not present

## 2022-08-16 DIAGNOSIS — O365990 Maternal care for other known or suspected poor fetal growth, unspecified trimester, not applicable or unspecified: Secondary | ICD-10-CM

## 2022-08-16 DIAGNOSIS — K5000 Crohn's disease of small intestine without complications: Secondary | ICD-10-CM

## 2022-08-16 DIAGNOSIS — K5090 Crohn's disease, unspecified, without complications: Secondary | ICD-10-CM

## 2022-08-16 DIAGNOSIS — O99013 Anemia complicating pregnancy, third trimester: Secondary | ICD-10-CM

## 2022-08-16 NOTE — Progress Notes (Signed)
   Subjective:  Jennifer Cain is a 26 y.o. G1P0000 at 19w2dbeing seen today for ongoing prenatal care.  She is currently monitored for the following issues for this high-risk pregnancy and has Enteritis-Chronic; FTT (failure to thrive) in adult; Protein calorie malnutrition ([place]; Chronic diarrhea; Nausea & vomiting-chronic; Normocytic anemia; Generalized weakness; Thrombocytosis; Hypokalemia; Malnutrition of moderate degree; Hypomagnesemia; Crohn's disease of small intestine (HEthete; Failure to thrive in adult; Colitis; Ileitis; Anemia due to GI blood loss; Supervision of other normal pregnancy, antepartum; Fetal growth restriction antepartum- resolved; and Anemia of pregnancy on their problem list.  Patient reports no complaints.  Contractions: Irritability. Vag. Bleeding: None.  Movement: Present. Denies leaking of fluid.   The following portions of the patient's history were reviewed and updated as appropriate: allergies, current medications, past family history, past medical history, past social history, past surgical history and problem list. Problem list updated.  Objective:   Vitals:   08/16/22 1505  BP: 127/87  Pulse: 94  Weight: 172 lb 3.2 oz (78.1 kg)    Fetal Status: Fetal Heart Rate (bpm): 154   Movement: Present  Presentation: Vertex  General:  Alert, oriented and cooperative. Patient is in no acute distress.  Skin: Skin is warm and dry. No rash noted.   Cardiovascular: Normal heart rate noted  Respiratory: Normal respiratory effort, no problems with respiration noted  Abdomen: Soft, gravid, appropriate for gestational age. Pain/Pressure: Present     Pelvic: Vag. Bleeding: None     Cervical exam performed Dilation: Closed Effacement (%): Thick Station: -2  Extremities: Normal range of motion.     Mental Status: Normal mood and affect. Normal behavior. Normal judgment and thought content.   Urinalysis:      Assessment and Plan:  Pregnancy: G1P0000 at 355w2d1. Supervision  of other normal pregnancy, antepartum BP and FHR normal Swabs collected today  2. Fetal growth restriction antepartum- resolved Resolved, normal EFW on most recent scan  3. Anemia during pregnancy in third trimester Lab Results  Component Value Date   HGB 9.6 (L) 06/19/2022   On PO iron  4. Crohn's disease of small intestine without complication (HCKingstonFeels well, flare resolved from last visit  5. Polyhydramnios Mild, AFI 27 Del recommended around 39 weeks Schedule at next visit  Preterm labor symptoms and general obstetric precautions including but not limited to vaginal bleeding, contractions, leaking of fluid and fetal movement were reviewed in detail with the patient. Please refer to After Visit Summary for other counseling recommendations.  Return in 1 week (on 08/23/2022) for Dyad patient, ob visit.   EcClarnce FlockMD

## 2022-08-16 NOTE — Patient Instructions (Signed)

## 2022-08-17 ENCOUNTER — Encounter: Payer: Self-pay | Admitting: Family Medicine

## 2022-08-17 LAB — GC/CHLAMYDIA PROBE AMP (~~LOC~~) NOT AT ARMC
Chlamydia: NEGATIVE
Comment: NEGATIVE
Comment: NORMAL
Neisseria Gonorrhea: NEGATIVE

## 2022-08-20 LAB — CULTURE, BETA STREP (GROUP B ONLY): Strep Gp B Culture: NEGATIVE

## 2022-08-23 ENCOUNTER — Ambulatory Visit (INDEPENDENT_AMBULATORY_CARE_PROVIDER_SITE_OTHER): Payer: Medicaid Other | Admitting: Family Medicine

## 2022-08-23 ENCOUNTER — Other Ambulatory Visit: Payer: Self-pay

## 2022-08-23 ENCOUNTER — Encounter: Payer: Self-pay | Admitting: Family Medicine

## 2022-08-23 VITALS — BP 130/88 | HR 103 | Wt 170.3 lb

## 2022-08-23 DIAGNOSIS — Z3480 Encounter for supervision of other normal pregnancy, unspecified trimester: Secondary | ICD-10-CM

## 2022-08-23 DIAGNOSIS — O403XX0 Polyhydramnios, third trimester, not applicable or unspecified: Secondary | ICD-10-CM

## 2022-08-23 DIAGNOSIS — O365990 Maternal care for other known or suspected poor fetal growth, unspecified trimester, not applicable or unspecified: Secondary | ICD-10-CM

## 2022-08-23 DIAGNOSIS — O99013 Anemia complicating pregnancy, third trimester: Secondary | ICD-10-CM

## 2022-08-23 DIAGNOSIS — K5000 Crohn's disease of small intestine without complications: Secondary | ICD-10-CM

## 2022-08-23 NOTE — Progress Notes (Signed)
   Subjective:  Jennifer Cain is a 26 y.o. G1P0000 at 48w2dbeing seen today for ongoing prenatal care.  She is currently monitored for the following issues for this high-risk pregnancy and has Enteritis-Chronic; FTT (failure to thrive) in adult; Protein calorie malnutrition (HE[place]; Chronic diarrhea; Nausea & vomiting-chronic; Normocytic anemia; Generalized weakness; Thrombocytosis; Hypokalemia; Malnutrition of moderate degree; Hypomagnesemia; Crohn's disease of small intestine (HJenn[place]; Failure to thrive in adult; Colitis; Ileitis; Anemia due to GI blood loss; Supervision of other normal pregnancy, antepartum; Fetal growth restriction antepartum- resolved; Anemia of pregnancy; and Polyhydramnios on their problem list.  Patient reports no complaints.  Contractions: Irritability. Vag. Bleeding: None.  Movement: Present. Denies leaking of fluid.   The following portions of the patient's history were reviewed and updated as appropriate: allergies, current medications, past family history, past medical history, past social history, past surgical history and problem list. Problem list updated.  Objective:   Vitals:   08/23/22 1433  BP: 130/88  Pulse: (!) 103  Weight: 170 lb 4.8 oz (77.2 kg)    Fetal Status: Fetal Heart Rate (bpm): 141   Movement: Present     General:  Alert, oriented and cooperative. Patient is in no acute distress.  Skin: Skin is warm and dry. No rash noted.   Cardiovascular: Normal heart rate noted  Respiratory: Normal respiratory effort, no problems with respiration noted  Abdomen: Soft, gravid, appropriate for gestational age. Pain/Pressure: Present     Pelvic: Vag. Bleeding: None     Cervical exam deferred        Extremities: Normal range of motion.  Edema: None  Mental Status: Normal mood and affect. Normal behavior. Normal judgment and thought content.   Urinalysis:      Assessment and Plan:  Pregnancy: G1P0000 at 355w2d1. Supervision of other normal pregnancy,  antepartum BP and FHR normal  2. Fetal growth restriction antepartum- resolved Most recent EFW normal  3. Anemia during pregnancy in third trimester Lab Results  Component Value Date   HGB 9.6 (L) 06/19/2022  On PO iron  4. Crohn's disease of small intestine without complication (HCSt. JosephFeels well, had flare earlier in pregnancy that has resolved with steroid taper  5. Polyhydramnios in third trimester complication, single or unspecified fetus Mild, AFI 27cm, scheduled for 39wk IOL per MFM recs  Term labor symptoms and general obstetric precautions including but not limited to vaginal bleeding, contractions, leaking of fluid and fetal movement were reviewed in detail with the patient. Please refer to After Visit Summary for other counseling recommendations.  Return in about 1 week (around 08/30/2022) for Dyad patient, ob visit.   EcClarnce FlockMD

## 2022-08-24 ENCOUNTER — Encounter: Payer: Self-pay | Admitting: Family Medicine

## 2022-08-28 ENCOUNTER — Encounter (HOSPITAL_COMMUNITY): Payer: Self-pay | Admitting: *Deleted

## 2022-08-28 ENCOUNTER — Telehealth (HOSPITAL_COMMUNITY): Payer: Self-pay | Admitting: *Deleted

## 2022-08-28 NOTE — Telephone Encounter (Signed)
Preadmission screen  

## 2022-08-30 ENCOUNTER — Other Ambulatory Visit: Payer: Self-pay | Admitting: Advanced Practice Midwife

## 2022-08-31 ENCOUNTER — Ambulatory Visit (INDEPENDENT_AMBULATORY_CARE_PROVIDER_SITE_OTHER): Payer: Medicaid Other | Admitting: Family Medicine

## 2022-08-31 ENCOUNTER — Other Ambulatory Visit: Payer: Self-pay

## 2022-08-31 VITALS — BP 138/89 | HR 114 | Wt 171.4 lb

## 2022-08-31 DIAGNOSIS — Z3480 Encounter for supervision of other normal pregnancy, unspecified trimester: Secondary | ICD-10-CM

## 2022-08-31 DIAGNOSIS — O365990 Maternal care for other known or suspected poor fetal growth, unspecified trimester, not applicable or unspecified: Secondary | ICD-10-CM

## 2022-08-31 DIAGNOSIS — O403XX0 Polyhydramnios, third trimester, not applicable or unspecified: Secondary | ICD-10-CM

## 2022-08-31 NOTE — Addendum Note (Signed)
Addended by: Clayton Lefort on: 08/31/2022 08:11 AM   Modules accepted: Orders

## 2022-08-31 NOTE — Progress Notes (Signed)
   PRENATAL VISIT NOTE  Subjective:  Jennifer Cain is a 26 y.o. G1P0000 at 24w3dbeing seen today for ongoing prenatal care.  She is currently monitored for the following issues for this low-risk pregnancy and has Enteritis-Chronic; FTT (failure to thrive) in adult; Protein calorie malnutrition (H[place]; Chronic diarrhea; Nausea & vomiting-chronic; Normocytic anemia; Generalized weakness; Thrombocytosis; Hypokalemia; Malnutrition of moderate degree; Hypomagnesemia; Crohn's disease of small intestine (HDecher[place]; Failure to thrive in adult; Colitis; Ileitis; Anemia due to GI blood loss; Supervision of other normal pregnancy, antepartum; Fetal growth restriction antepartum- resolved; Anemia of pregnancy; and Polyhydramnios on their problem list.  Patient reports  diarrhea .  Contractions: Irritability. Vag. Bleeding: None.  Movement: Present. Denies leaking of fluid.   The following portions of the patient's history were reviewed and updated as appropriate: allergies, current medications, past family history, past medical history, past social history, past surgical history and problem list.   Objective:   Vitals:   08/31/22 1036  BP: 138/89  Pulse: (!) 114  Weight: 171 lb 6.4 oz (77.7 kg)    Fetal Status: Fetal Heart Rate (bpm): 153 Fundal Height: 35 cm Movement: Present  Presentation: Vertex  General:  Alert, oriented and cooperative. Patient is in no acute distress.  Skin: Skin is warm and dry. No rash noted.   Cardiovascular: Normal heart rate noted  Respiratory: Normal respiratory effort, no problems with respiration noted  Abdomen: Soft, gravid, appropriate for gestational age.  Pain/Pressure: Present     Pelvic: Cervical exam performed in the presence of a chaperone Dilation: Closed Effacement (%): 30 Station: -2  Extremities: Normal range of motion.     Mental Status: Normal mood and affect. Normal behavior. Normal judgment and thought content.   Assessment and Plan:  Pregnancy: G1P0000 at  352w3d. Supervision of other normal pregnancy, antepartum Continue routine prenatal care.  2. Fetal growth restriction antepartum- resolved But with poly--for delivery at 39 wks, scheduled  3. Polyhydramnios in third trimester complication, single or unspecified fetus IOL @ 39 weeks  Term labor symptoms and general obstetric precautions including but not limited to vaginal bleeding, contractions, leaking of fluid and fetal movement were reviewed in detail with the patient. Please refer to After Visit Summary for other counseling recommendations.   No follow-ups on file.  Future Appointments  Date Time Provider [place]10/16/2023  6:45 AM [facility]-LD SCHED ROOM [facility]-INDC None  09/06/2022  9:15 AM EcClarnce FlockMD [facility]10/23/2023 10:15 AM W[facility]WOCA NST [facility]10/23/2023 10:55 AM NeErnestina PatchesKiJuanita CraverMD [facility]  TaDonnamae JudeMD

## 2022-09-03 ENCOUNTER — Other Ambulatory Visit: Payer: Self-pay | Admitting: Advanced Practice Midwife

## 2022-09-04 ENCOUNTER — Inpatient Hospital Stay (HOSPITAL_COMMUNITY): Payer: Medicaid Other

## 2022-09-04 ENCOUNTER — Encounter: Payer: Self-pay | Admitting: Family Medicine

## 2022-09-04 ENCOUNTER — Encounter (HOSPITAL_COMMUNITY): Payer: Self-pay | Admitting: Family Medicine

## 2022-09-04 ENCOUNTER — Other Ambulatory Visit: Payer: Self-pay

## 2022-09-04 ENCOUNTER — Inpatient Hospital Stay (HOSPITAL_COMMUNITY)
Admission: AD | Admit: 2022-09-04 | Discharge: 2022-09-08 | DRG: 787 | Disposition: A | Discharge status: Home / Self Care | Payer: Medicaid Other | Attending: Obstetrics and Gynecology | Admitting: Obstetrics and Gynecology

## 2022-09-04 DIAGNOSIS — O135 Gestational [pregnancy-induced] hypertension without significant proteinuria, complicating the puerperium: Secondary | ICD-10-CM | POA: Diagnosis not present

## 2022-09-04 DIAGNOSIS — K5090 Crohn's disease, unspecified, without complications: Secondary | ICD-10-CM | POA: Diagnosis present

## 2022-09-04 DIAGNOSIS — Z79899 Other long term (current) drug therapy: Secondary | ICD-10-CM

## 2022-09-04 DIAGNOSIS — Z3A39 39 weeks gestation of pregnancy: Secondary | ICD-10-CM | POA: Diagnosis not present

## 2022-09-04 DIAGNOSIS — Z3490 Encounter for supervision of normal pregnancy, unspecified, unspecified trimester: Secondary | ICD-10-CM | POA: Diagnosis present

## 2022-09-04 DIAGNOSIS — D62 Acute posthemorrhagic anemia: Secondary | ICD-10-CM | POA: Diagnosis not present

## 2022-09-04 DIAGNOSIS — O9962 Diseases of the digestive system complicating childbirth: Secondary | ICD-10-CM | POA: Diagnosis present

## 2022-09-04 DIAGNOSIS — O9081 Anemia of the puerperium: Secondary | ICD-10-CM | POA: Diagnosis not present

## 2022-09-04 DIAGNOSIS — O165 Unspecified maternal hypertension, complicating the puerperium: Secondary | ICD-10-CM | POA: Diagnosis present

## 2022-09-04 DIAGNOSIS — O403XX0 Polyhydramnios, third trimester, not applicable or unspecified: Secondary | ICD-10-CM | POA: Diagnosis present

## 2022-09-04 DIAGNOSIS — Z98891 History of uterine scar from previous surgery: Principal | ICD-10-CM

## 2022-09-04 DIAGNOSIS — Z3480 Encounter for supervision of other normal pregnancy, unspecified trimester: Secondary | ICD-10-CM

## 2022-09-04 LAB — CBC
HCT: 29.4 % — ABNORMAL LOW (ref 36.0–46.0)
Hemoglobin: 9.6 g/dL — ABNORMAL LOW (ref 12.0–15.0)
MCH: 26.4 pg (ref 26.0–34.0)
MCHC: 32.7 g/dL (ref 30.0–36.0)
MCV: 81 fL (ref 80.0–100.0)
Platelets: 256 10*3/uL (ref 150–400)
RBC: 3.63 MIL/uL — ABNORMAL LOW (ref 3.87–5.11)
RDW: 13.5 % (ref 11.5–15.5)
WBC: 5.3 10*3/uL (ref 4.0–10.5)
nRBC: 0 % (ref 0.0–0.2)

## 2022-09-04 LAB — TYPE AND SCREEN
ABO/RH(D): O POS
Antibody Screen: NEGATIVE

## 2022-09-04 MED ORDER — MISOPROSTOL 25 MCG QUARTER TABLET
25.0000 ug | ORAL_TABLET | Freq: Once | ORAL | Status: AC
  Administered 2022-09-04: 25 ug via VAGINAL
  Filled 2022-09-04: qty 1

## 2022-09-04 MED ORDER — LACTATED RINGERS IV SOLN: 500.0000 mL | Freq: Once | INTRAVENOUS | Status: DC

## 2022-09-04 MED ORDER — DIPHENHYDRAMINE HCL 50 MG/ML IJ SOLN: 12.5000 mg | INTRAMUSCULAR | Status: DC | PRN

## 2022-09-04 MED ORDER — TERBUTALINE SULFATE 1 MG/ML IJ SOLN: 0.2500 mg | Freq: Once | INTRAMUSCULAR | Status: DC | PRN

## 2022-09-04 MED ORDER — EPHEDRINE 5 MG/ML INJ: 10.0000 mg | INTRAVENOUS | Status: DC | PRN

## 2022-09-04 MED ORDER — MISOPROSTOL 25 MCG QUARTER TABLET
25.0000 ug | ORAL_TABLET | Freq: Once | ORAL | Status: AC
  Administered 2022-09-04: 25 ug via ORAL
  Filled 2022-09-04: qty 1

## 2022-09-04 MED ORDER — LACTATED RINGERS IV SOLN
500.0000 mL | INTRAVENOUS | Status: DC | PRN
  Administered 2022-09-04 – 2022-09-05 (×2): 500 mL via INTRAVENOUS
  Administered 2022-09-05: 250 mL via INTRAVENOUS
  Administered 2022-09-06: 500 mL via INTRAVENOUS

## 2022-09-04 MED ORDER — OXYTOCIN-SODIUM CHLORIDE 30-0.9 UT/500ML-% IV SOLN: 2.5000 [IU]/h | INTRAVENOUS | Status: DC

## 2022-09-04 MED ORDER — MISOPROSTOL 50MCG HALF TABLET
50.0000 ug | ORAL_TABLET | Freq: Once | ORAL | Status: AC
  Administered 2022-09-04: 50 ug via ORAL
  Filled 2022-09-04: qty 1

## 2022-09-04 MED ORDER — LACTATED RINGERS IV SOLN
INTRAVENOUS | Status: DC
  Administered 2022-09-06: 125 mL/h via INTRAVENOUS

## 2022-09-04 MED ORDER — FENTANYL CITRATE (PF) 100 MCG/2ML IJ SOLN
50.0000 ug | INTRAMUSCULAR | Status: DC | PRN
  Administered 2022-09-05 – 2022-09-06 (×3): 100 ug via INTRAVENOUS
  Filled 2022-09-04 (×3): qty 2

## 2022-09-04 MED ORDER — PHENYLEPHRINE 80 MCG/ML (10ML) SYRINGE FOR IV PUSH (FOR BLOOD PRESSURE SUPPORT): 80.0000 ug | PREFILLED_SYRINGE | INTRAVENOUS | Status: DC | PRN

## 2022-09-04 MED ORDER — OXYTOCIN BOLUS FROM INFUSION: 333.0000 mL | Freq: Once | INTRAVENOUS | Status: DC

## 2022-09-04 MED ORDER — FENTANYL-BUPIVACAINE-NACL 0.5-0.125-0.9 MG/250ML-% EP SOLN
12.0000 mL/h | EPIDURAL | Status: DC | PRN
  Administered 2022-09-06: 11 mL/h via EPIDURAL
  Filled 2022-09-04: qty 250

## 2022-09-04 MED ORDER — ONDANSETRON HCL 4 MG/2ML IJ SOLN: 4.0000 mg | Freq: Four times a day (QID) | INTRAMUSCULAR | Status: DC | PRN

## 2022-09-04 MED ORDER — ACETAMINOPHEN 325 MG PO TABS: 650.0000 mg | ORAL_TABLET | ORAL | Status: DC | PRN

## 2022-09-04 MED ORDER — SOD CITRATE-CITRIC ACID 500-334 MG/5ML PO SOLN
30.0000 mL | ORAL | Status: DC | PRN
  Administered 2022-09-06: 30 mL via ORAL
  Filled 2022-09-04: qty 30

## 2022-09-04 MED ORDER — LIDOCAINE HCL (PF) 1 % IJ SOLN: 30.0000 mL | INTRAMUSCULAR | Status: DC | PRN

## 2022-09-04 NOTE — H&P (Addendum)
OBSTETRIC ADMISSION HISTORY AND PHYSICAL  Jennifer Cain is a 26 y.o. female G1P0000 with IUP at 7w0dpresenting for IOL for polyhydramnios in the setting of resolved FGR. She reports +FMs. No LOF, VB, blurry vision, headaches, peripheral edema, or RUQ pain. She plans on breastfeeding. She requests undecided for birth control.  Dating: By LMP  --->  Estimated Date of Delivery: 09/11/22  Sono:  08/16/22  @[redacted]w[redacted]d , normal anatomy, Cephalic presentation, 29702O 32%ile, EFW 5lb15oz   Prenatal History/Complications: NA  Past Medical History: Past Medical History:  Diagnosis Date   Crohn disease (HCarter     Past Surgical History: Past Surgical History:  Procedure Laterality Date   BIOPSY  07/20/2018   Procedure: BIOPSY;  Surgeon: HCarol Ada MD;  [facility]: [facility];  Service: Endoscopy;;   BIOPSY  05/04/2021   Procedure: BIOPSY;  Surgeon: BThornton Park MD;  Location: [facility];  Service: Gastroenterology;;   COLONOSCOPY WITH PROPOFOL N/A 07/20/2018   Procedure: COLONOSCOPY WITH PROPOFOL;  Surgeon: HCarol Ada MD;  Location: [facility];  Service: Endoscopy;  Laterality: N/A;   COLONOSCOPY WITH PROPOFOL N/A 05/04/2021   Procedure: COLONOSCOPY WITH PROPOFOL;  Surgeon: BThornton Park MD;  Location: [facility];  Service: Gastroenterology;  Laterality: N/A;   ESOPHAGOGASTRODUODENOSCOPY (EGD) WITH PROPOFOL N/A 07/20/2018   Procedure: ESOPHAGOGASTRODUODENOSCOPY (EGD) WITH PROPOFOL;  Surgeon: HCarol Ada MD;  Location: [facility];  Service: Endoscopy;  Laterality: N/A;   ESOPHAGOGASTRODUODENOSCOPY (EGD) WITH PROPOFOL N/A 05/04/2021   Procedure: ESOPHAGOGASTRODUODENOSCOPY (EGD) WITH PROPOFOL;  Surgeon: BThornton Park MD;  Location: [facility];  Service: Gastroenterology;  Laterality: N/A;    Obstetrical History: OB History     Gravida  1   Para  0   Term  0   Preterm      AB  0   Living  0      SAB      IAB      Ectopic      Multiple      Live  Births              Social History: Social History   Socioeconomic History   Marital status: Single    Spouse name: Not on file   Number of children: Not on file   Years of education: Not on file   Highest education level: Not on file  Occupational History   Not on file  Tobacco Use   Smoking status: Never    Passive exposure: Never   Smokeless tobacco: Never  Vaping Use   Vaping Use: Never used  Substance and Sexual Activity   Alcohol use: Never   Drug use: Never   Sexual activity: Not Currently    Birth control/protection: None  Other Topics Concern   Not on file  Social History Narrative   Not on file   Social Determinants of Health   Financial Resource Strain: Not on file  Food Insecurity: No Food Insecurity (08/31/2022)   Hunger Vital Sign    Worried About Running Out of Food in the Last Year: Never true    Ran Out of Food in the Last Year: Never true  Transportation Needs: No Transportation Needs (09/04/2022)   PRAPARE - THydrologist(Medical): No    Lack of Transportation (Non-Medical): No  Physical Activity: Not on file  Stress: Not on file  Social Connections: Not on file    Family History: Family History  Problem Relation Age of Onset   Stroke Mother  Heart disease Father    GI Disease Neg Hx    Autoimmune disease Neg Hx    Asthma Neg Hx    Cancer Neg Hx    Diabetes Neg Hx     Allergies: No Known Allergies  Medications Prior to Admission  Medication Sig Dispense Refill Last Dose   ferrous sulfate 325 (65 FE) MG EC tablet Take 1 tablet (325 mg total) by mouth every other day. 30 tablet 2    hydrocortisone (ANUSOL-HC) 25 MG suppository Place 1 suppository (25 mg total) rectally 2 (two) times daily. 12 suppository 0    metoCLOPramide (REGLAN) 10 MG tablet Take 1 tablet (10 mg total) by mouth every 6 (six) hours as needed (nausea/headache). 12 tablet 0    Prenatal Vit-Fe Fumarate-FA (MULTIVITAMIN-PRENATAL)  27-0.8 MG TABS tablet Take 1 tablet by mouth daily 60 tablet 0      Review of Systems:  All systems reviewed and negative except as stated in HPI  PE: Blood pressure 136/89, pulse (!) 107, temperature 98 F (36.7 C), temperature source Oral, resp. rate 16, height 5' 3"  (1.6 m), weight 77.1 kg, last menstrual period 11/16/2021. General appearance: alert Lungs: regular rate and effort Heart: regular rate  Abdomen: soft, non-tender Extremities: Homans sign is negative, no sign of DVT Presentation: cephalic EFM: Not currently on continuous monitoring Toco: None currently Dilation: Closed Effacement (%): Thick Station: -2 Exam by:: lee   Prenatal labs: ABO, Rh: --/--/O POS (10/16 1553) Antibody: NEG (10/16 1553) Rubella: 1.35 (03/22 1123) RPR: Non Reactive (07/26 0829)  HBsAg: Negative (03/22 1123)  HIV: Non Reactive (07/26 0829)  GBS: Negative/-- (09/27 1657)  2 hr GTT Normal  Prenatal Transfer Tool  Maternal Diabetes: No Genetic Screening: Normal Maternal Ultrasounds/Referrals: Normal and Other:Polyhydramnios in third trimester Fetal Ultrasounds or other Referrals:  Referred to Materal Fetal Medicine  Maternal Substance Abuse:  No Significant Maternal Medications:  None Significant Maternal Lab Results: Group B Strep negative  Results for orders placed or performed during the hospital encounter of 09/04/22 (from the past 24 hour(s))  CBC   Collection Time: 09/04/22  3:53 PM  Result Value Ref Range   WBC 5.3 4.0 - 10.5 K/uL   RBC 3.63 (L) 3.87 - 5.11 MIL/uL   Hemoglobin 9.6 (L) 12.0 - 15.0 g/dL   HCT 29.4 (L) 36.0 - 46.0 %   MCV 81.0 80.0 - 100.0 fL   MCH 26.4 26.0 - 34.0 pg   MCHC 32.7 30.0 - 36.0 g/dL   RDW 13.5 11.5 - 15.5 %   Platelets 256 150 - 400 K/uL   nRBC 0.0 0.0 - 0.2 %  Type and screen   Collection Time: 09/04/22  3:53 PM  Result Value Ref Range   ABO/RH(D) O POS    Antibody Screen NEG    Sample Expiration      09/07/2022,2359 Performed at  [facility], [place] [address] [place], [place] [address]     Patient Active Problem List   Diagnosis Date Noted   Encounter for elective induction of labor 09/04/2022   Polyhydramnios 08/16/2022   Anemia of pregnancy 06/15/2022   Fetal growth restriction antepartum- resolved 05/09/2022   Supervision of other normal pregnancy, antepartum 01/31/2022   Anemia due to GI blood loss    Ileitis 04/30/2021   Colitis 08/09/2018   Crohn's disease of small intestine ([place]) 07/24/2018   Failure to thrive in adult    Hypomagnesemia 07/20/2018   Normocytic anemia 07/19/2018   Generalized  weakness 07/19/2018   Thrombocytosis 07/19/2018   Hypokalemia 07/19/2018   Malnutrition of moderate degree 07/19/2018   Enteritis-Chronic 07/18/2018   FTT (failure to thrive) in adult 07/18/2018   Protein calorie malnutrition ([place]) 07/18/2018   Chronic diarrhea 07/18/2018   Nausea & vomiting-chronic 07/18/2018    Assessment: Jennifer Cain is a 26 y.o. G1P0000 at 61w0dhere for IOL  1. Labor: Early latent 2. FWB: Unknown 3. Pain: None, family support, epidural available 4. GBS: Negative 5. Polyhydramnios AFI 27.5   Plan: Admit to L&D Initiate induction with cytotec.   CDeloria Lair DO  09/04/2022, 4:48 PM  Fellow Attestation  I saw and evaluated the patient, performing the key elements of the service.I  personally performed or re-performed the history, physical exam, and medical decision making activities of this service and have verified that the service and findings are accurately documented in the resident's note. I developed the management plan that is described in the resident's note, and I agree with the content, with my edits above.    VGifford Shave MD OB Fellow

## 2022-09-04 NOTE — Progress Notes (Signed)
Labor Progress Note Jennifer Cain is a 26 y.o. G1P0000 at 59w0dpresented for IOL for polyhydramnios, resolved FGR.  S: Patient is resting comfortably. She feels anxious. Feeling contractions but not in significant pain.   O:  BP (!) 151/65   Pulse (!) 114   Temp 98 F (36.7 C) (Oral)   Resp 16   Ht 5' 3"  (1.6 m)   Wt 77.1 kg   LMP 11/16/2021 Comment: neg urine preg test on admission  BMI 30.11 kg/m  EFM: good variability/accels present/intermittent variable decels  CVE: Dilation: Closed Effacement (%): Thick Cervical Position: Posterior Station: -2 Presentation: Vertex Exam by:: Mercado  Cephalic presentation confirmed with bedside [place]   A&P: 26y.o. G1P0000 332w0dere for IOL for polyhydramnios, FGR that has resolved.  #Labor: Still closed and thick, not amenable for FB. Will hold on additional ripening given variable decels, reassess after repositioning.  #Pain: none, epidural prn #FWB: category 2 #GBS negative Hx of Crohn's. No signs of active flare, not on meds currently.    MiCecilio AsperMD [facility] HospitalCoMount Clare:03 PM

## 2022-09-05 LAB — RPR: RPR Ser Ql: NONREACTIVE

## 2022-09-05 MED ORDER — KETOROLAC TROMETHAMINE 30 MG/ML IJ SOLN
INTRAMUSCULAR | Status: AC
  Filled 2022-09-05: qty 1

## 2022-09-05 MED ORDER — OXYTOCIN-SODIUM CHLORIDE 30-0.9 UT/500ML-% IV SOLN
1.0000 m[IU]/min | INTRAVENOUS | Status: DC
  Administered 2022-09-05: 2 m[IU]/min via INTRAVENOUS
  Filled 2022-09-05: qty 500

## 2022-09-05 MED ORDER — TERBUTALINE SULFATE 1 MG/ML IJ SOLN: 0.2500 mg | Freq: Once | INTRAMUSCULAR | Status: DC | PRN

## 2022-09-05 NOTE — Progress Notes (Signed)
Labor Progress Note Jennifer Cain is a 26 y.o. G1P0000 at 57w1dpresented for IOL for polyhydramnios, resolved FGR.  S: Patient is resting comfortably. Having contractions but not in unbearable pain.   O:  BP 129/79   Pulse 89   Temp 98.2 F (36.8 C) (Oral)   Resp 16   Ht 5' 3"  (1.6 m)   Wt 77.1 kg   LMP 11/16/2021 Comment: neg urine preg test on admission  BMI 30.11 kg/m  EFM: 140/good variability/accels, intermittent variable decel with rapid recovery  CVE: Dilation: 3.5 Effacement (%): 60 Cervical Position: Posterior Station: -2 Presentation: Vertex Exam by:: Cresenzo MD   A&P: 26y.o. G1P0000 3103w1der for IOL for polyhydramnios/ resolved FGR.  #Labor: FB out, augmenting with pitocin. S/p AROM. Continue augmentation.  #Pain: epidural prn #FWB: category 1 over last 30 min #GBS negative #Hx of Crohn's. No signs of active flare, not on meds currently  MiCecilio AsperMDElimor WoQuinton:47 PM

## 2022-09-05 NOTE — Progress Notes (Signed)
Labor Progress Note Jennifer Cain is a 26 y.o. G1P0000 at 51w0dpresented for IOL for polyhydramnios, resolved FGR.   S: Patient is resting comfortably.   O:  BP 125/72   Pulse (!) 104   Temp 98.2 F (36.8 C) (Oral)   Resp 16   Ht 5' 3"  (1.6 m)   Wt 77.1 kg   LMP 11/16/2021 Comment: neg urine preg test on admission  BMI 30.11 kg/m  EFM: 155 bpm/Moderate variability/ 15x15 accels/ Variable decels  CVE: Dilation: Closed Effacement (%): Thick Cervical Position: Posterior Station: -2 Presentation: Vertex Exam by:: Mercado   A&P: 26y.o. G1P0000 354w0dere for IOL for polyhydramnios, FGR that has resolved.   #Labor: FB placed. S/p cytotec #Pain: support, epidural prn #FWB: CAT 1 #GBS negative #Hx of Crohn's. No signs of active flare, not on meds currently.    JeShelda PalDOEdwardsor WoGoodwinroup 12:40 AM

## 2022-09-05 NOTE — Progress Notes (Signed)
Labor Progress Note Jennifer Cain is a 26 y.o. G1P0000 at 23w1dpresented for IOL for polyhydramnios, resolved FGR.   S: Doing well no concerns. Not feeling contractions.   O:  BP 114/67   Pulse 72   Temp 98.2 F (36.8 C) (Oral)   Resp 16   Ht 5' 3"  (1.6 m)   Wt 77.1 kg   LMP 11/16/2021 Comment: neg urine preg test on admission  BMI 30.11 kg/m  EFM: 130/good variability/accels, no decels CVE: Dilation: 3 Effacement (%): 60 Cervical Position: Posterior Station: -2 Presentation: Vertex Exam by:: [name], MD  A&P: 26y.o. G1P0000 342w1der for IOL for polyhydramnios/ resolved FGR.  #Labor: FB out, augmenting with pitocin. S/p AROM. No cervical change since AROM. Plan to increase pitocin (she is not feeling contractions). If unchanged from last exam, place IUPC at next cervical exam.  #Pain: Maternally supported, epidural when needed #FWB: category I #GBS negative #Hx of Crohn's. No signs of active flare, not on meds currently  [name]

## 2022-09-06 ENCOUNTER — Encounter (HOSPITAL_COMMUNITY)
Admission: AD | Disposition: A | Discharge status: Home / Self Care | Payer: Self-pay | Source: Home / Self Care | Attending: Obstetrics and Gynecology

## 2022-09-06 ENCOUNTER — Encounter (HOSPITAL_COMMUNITY): Payer: Self-pay | Admitting: Family Medicine

## 2022-09-06 ENCOUNTER — Encounter: Payer: Self-pay | Admitting: Family Medicine

## 2022-09-06 ENCOUNTER — Encounter (HOSPITAL_COMMUNITY): Payer: Self-pay | Admitting: Anesthesiology

## 2022-09-06 ENCOUNTER — Inpatient Hospital Stay (HOSPITAL_COMMUNITY): Payer: Medicaid Other | Admitting: Anesthesiology

## 2022-09-06 DIAGNOSIS — O403XX0 Polyhydramnios, third trimester, not applicable or unspecified: Secondary | ICD-10-CM

## 2022-09-06 DIAGNOSIS — Z3A39 39 weeks gestation of pregnancy: Secondary | ICD-10-CM

## 2022-09-06 DIAGNOSIS — O135 Gestational [pregnancy-induced] hypertension without significant proteinuria, complicating the puerperium: Secondary | ICD-10-CM

## 2022-09-06 SURGERY — Surgical Case
Anesthesia: Epidural

## 2022-09-06 MED ORDER — ZOLPIDEM TARTRATE 5 MG PO TABS: 5.0000 mg | ORAL_TABLET | Freq: Every evening | ORAL | Status: DC | PRN

## 2022-09-06 MED ORDER — DEXAMETHASONE SODIUM PHOSPHATE 4 MG/ML IJ SOLN
INTRAMUSCULAR | Status: AC
  Filled 2022-09-06: qty 1

## 2022-09-06 MED ORDER — ONDANSETRON HCL 4 MG/2ML IJ SOLN
INTRAMUSCULAR | Status: DC | PRN
  Administered 2022-09-06: 4 mg via INTRAVENOUS

## 2022-09-06 MED ORDER — FENTANYL CITRATE (PF) 100 MCG/2ML IJ SOLN
INTRAMUSCULAR | Status: DC | PRN
  Administered 2022-09-06: 100 ug via EPIDURAL

## 2022-09-06 MED ORDER — OXYTOCIN-SODIUM CHLORIDE 30-0.9 UT/500ML-% IV SOLN
INTRAVENOUS | Status: DC | PRN
  Administered 2022-09-06: 200 mL via INTRAVENOUS

## 2022-09-06 MED ORDER — ONDANSETRON HCL 4 MG/2ML IJ SOLN: 4.0000 mg | Freq: Three times a day (TID) | INTRAMUSCULAR | Status: DC | PRN

## 2022-09-06 MED ORDER — OXYTOCIN-SODIUM CHLORIDE 30-0.9 UT/500ML-% IV SOLN
INTRAVENOUS | Status: AC
  Filled 2022-09-06: qty 500

## 2022-09-06 MED ORDER — DIPHENHYDRAMINE HCL 50 MG/ML IJ SOLN: 12.5000 mg | INTRAMUSCULAR | Status: DC | PRN

## 2022-09-06 MED ORDER — LACTATED RINGERS IV SOLN: INTRAVENOUS | Status: DC

## 2022-09-06 MED ORDER — FENTANYL CITRATE (PF) 100 MCG/2ML IJ SOLN
INTRAMUSCULAR | Status: AC
  Filled 2022-09-06: qty 2

## 2022-09-06 MED ORDER — CEFAZOLIN SODIUM-DEXTROSE 2-4 GM/100ML-% IV SOLN
INTRAVENOUS | Status: AC
  Filled 2022-09-06: qty 100

## 2022-09-06 MED ORDER — IBUPROFEN 600 MG PO TABS
600.0000 mg | ORAL_TABLET | Freq: Four times a day (QID) | ORAL | Status: DC
  Administered 2022-09-06 – 2022-09-08 (×7): 600 mg via ORAL
  Filled 2022-09-06 (×7): qty 1

## 2022-09-06 MED ORDER — NALOXONE HCL 0.4 MG/ML IJ SOLN: 0.4000 mg | INTRAMUSCULAR | Status: DC | PRN

## 2022-09-06 MED ORDER — IBUPROFEN 600 MG PO TABS: 600.0000 mg | ORAL_TABLET | Freq: Four times a day (QID) | ORAL | Status: DC

## 2022-09-06 MED ORDER — WITCH HAZEL-GLYCERIN EX PADS: 1.0000 | MEDICATED_PAD | CUTANEOUS | Status: DC | PRN

## 2022-09-06 MED ORDER — OXYCODONE HCL 5 MG PO TABS: 5.0000 mg | ORAL_TABLET | ORAL | Status: DC | PRN

## 2022-09-06 MED ORDER — DEXAMETHASONE SODIUM PHOSPHATE 4 MG/ML IJ SOLN
INTRAMUSCULAR | Status: DC | PRN
  Administered 2022-09-06: 4 mg via INTRAVENOUS

## 2022-09-06 MED ORDER — KETOROLAC TROMETHAMINE 30 MG/ML IJ SOLN
30.0000 mg | Freq: Four times a day (QID) | INTRAMUSCULAR | Status: AC | PRN
  Administered 2022-09-06: 30 mg via INTRAVENOUS

## 2022-09-06 MED ORDER — LIDOCAINE-EPINEPHRINE (PF) 2 %-1:200000 IJ SOLN
INTRAMUSCULAR | Status: DC | PRN
  Administered 2022-09-06 (×2): 5 mL via EPIDURAL

## 2022-09-06 MED ORDER — SIMETHICONE 80 MG PO CHEW: 80.0000 mg | CHEWABLE_TABLET | ORAL | Status: DC | PRN

## 2022-09-06 MED ORDER — ENOXAPARIN SODIUM 40 MG/0.4ML IJ SOSY
40.0000 mg | PREFILLED_SYRINGE | INTRAMUSCULAR | Status: DC
  Administered 2022-09-07 – 2022-09-08 (×2): 40 mg via SUBCUTANEOUS
  Filled 2022-09-06 (×2): qty 0.4

## 2022-09-06 MED ORDER — ONDANSETRON HCL 4 MG/2ML IJ SOLN
INTRAMUSCULAR | Status: AC
  Filled 2022-09-06: qty 2

## 2022-09-06 MED ORDER — CEFAZOLIN SODIUM-DEXTROSE 2-3 GM-%(50ML) IV SOLR
INTRAVENOUS | Status: DC | PRN
  Administered 2022-09-06: 2 g via INTRAVENOUS

## 2022-09-06 MED ORDER — FENTANYL CITRATE (PF) 100 MCG/2ML IJ SOLN: INTRAMUSCULAR | Status: DC | PRN

## 2022-09-06 MED ORDER — DIPHENHYDRAMINE HCL 25 MG PO CAPS: 25.0000 mg | ORAL_CAPSULE | ORAL | Status: DC | PRN

## 2022-09-06 MED ORDER — DEXAMETHASONE SODIUM PHOSPHATE 10 MG/ML IJ SOLN
INTRAMUSCULAR | Status: AC
  Filled 2022-09-06: qty 1

## 2022-09-06 MED ORDER — NALOXONE HCL 4 MG/10ML IJ SOLN: 1.0000 ug/kg/h | INTRAVENOUS | Status: DC | PRN

## 2022-09-06 MED ORDER — ACETAMINOPHEN 500 MG PO TABS
1000.0000 mg | ORAL_TABLET | Freq: Four times a day (QID) | ORAL | Status: DC
  Administered 2022-09-06 – 2022-09-08 (×7): 1000 mg via ORAL
  Filled 2022-09-06 (×7): qty 2

## 2022-09-06 MED ORDER — TRANEXAMIC ACID-NACL 1000-0.7 MG/100ML-% IV SOLN
INTRAVENOUS | Status: AC
  Filled 2022-09-06: qty 100

## 2022-09-06 MED ORDER — MORPHINE SULFATE (PF) 0.5 MG/ML IJ SOLN
INTRAMUSCULAR | Status: DC | PRN
  Administered 2022-09-06: 3 mg via EPIDURAL

## 2022-09-06 MED ORDER — MEPERIDINE HCL 25 MG/ML IJ SOLN: 6.2500 mg | INTRAMUSCULAR | Status: DC | PRN

## 2022-09-06 MED ORDER — DIBUCAINE (PERIANAL) 1 % EX OINT: 1.0000 | TOPICAL_OINTMENT | CUTANEOUS | Status: DC | PRN

## 2022-09-06 MED ORDER — PHENYLEPHRINE HCL (PRESSORS) 10 MG/ML IV SOLN
INTRAVENOUS | Status: DC | PRN
  Administered 2022-09-06 (×2): 160 ug via INTRAVENOUS
  Administered 2022-09-06 (×3): 80 ug via INTRAVENOUS

## 2022-09-06 MED ORDER — DIPHENHYDRAMINE HCL 25 MG PO CAPS: 25.0000 mg | ORAL_CAPSULE | Freq: Four times a day (QID) | ORAL | Status: DC | PRN

## 2022-09-06 MED ORDER — TRANEXAMIC ACID-NACL 1000-0.7 MG/100ML-% IV SOLN
INTRAVENOUS | Status: DC | PRN
  Administered 2022-09-06: 1000 mg via INTRAVENOUS

## 2022-09-06 MED ORDER — ACETAMINOPHEN 10 MG/ML IV SOLN
INTRAVENOUS | Status: AC
  Filled 2022-09-06: qty 100

## 2022-09-06 MED ORDER — KETOROLAC TROMETHAMINE 30 MG/ML IJ SOLN: 30.0000 mg | Freq: Four times a day (QID) | INTRAMUSCULAR | Status: AC | PRN

## 2022-09-06 MED ORDER — ACETAMINOPHEN 10 MG/ML IV SOLN
INTRAVENOUS | Status: DC | PRN
  Administered 2022-09-06: 1000 mg via INTRAVENOUS

## 2022-09-06 MED ORDER — LIDOCAINE HCL (PF) 1 % IJ SOLN
INTRAMUSCULAR | Status: DC | PRN
  Administered 2022-09-06 (×2): 4 mL via EPIDURAL

## 2022-09-06 MED ORDER — SODIUM CHLORIDE 0.9% FLUSH
3.0000 mL | INTRAVENOUS | Status: DC | PRN
  Administered 2022-09-07: 3 mL via INTRAVENOUS

## 2022-09-06 MED ORDER — MENTHOL 3 MG MT LOZG: 1.0000 | LOZENGE | OROMUCOSAL | Status: DC | PRN

## 2022-09-06 MED ORDER — PRENATAL MULTIVITAMIN CH
1.0000 | ORAL_TABLET | Freq: Every day | ORAL | Status: DC
  Administered 2022-09-07 – 2022-09-08 (×2): 1 via ORAL
  Filled 2022-09-06 (×2): qty 1

## 2022-09-06 MED ORDER — KETOROLAC TROMETHAMINE 30 MG/ML IJ SOLN
INTRAMUSCULAR | Status: AC
  Filled 2022-09-06: qty 1

## 2022-09-06 MED ORDER — CEFAZOLIN SODIUM-DEXTROSE 2-3 GM-%(50ML) IV SOLR: INTRAVENOUS | Status: DC | PRN

## 2022-09-06 MED ORDER — MEASLES, MUMPS & RUBELLA VAC IJ SOLR: 0.5000 mL | Freq: Once | INTRAMUSCULAR | Status: DC

## 2022-09-06 MED ORDER — SODIUM CHLORIDE 0.9 % IV SOLN
INTRAVENOUS | Status: DC | PRN
  Administered 2022-09-06: 500 mg via INTRAVENOUS

## 2022-09-06 MED ORDER — SIMETHICONE 80 MG PO CHEW
80.0000 mg | CHEWABLE_TABLET | Freq: Three times a day (TID) | ORAL | Status: DC
  Administered 2022-09-07 – 2022-09-08 (×3): 80 mg via ORAL
  Filled 2022-09-06 (×4): qty 1

## 2022-09-06 MED ORDER — OXYTOCIN-SODIUM CHLORIDE 30-0.9 UT/500ML-% IV SOLN: 2.5000 [IU]/h | INTRAVENOUS | Status: AC

## 2022-09-06 MED ORDER — COCONUT OIL OIL: 1.0000 | TOPICAL_OIL | Status: DC | PRN

## 2022-09-06 MED ORDER — MORPHINE SULFATE (PF) 0.5 MG/ML IJ SOLN
INTRAMUSCULAR | Status: AC
  Filled 2022-09-06: qty 10

## 2022-09-06 MED ORDER — SENNOSIDES-DOCUSATE SODIUM 8.6-50 MG PO TABS
2.0000 | ORAL_TABLET | ORAL | Status: DC
  Administered 2022-09-07 – 2022-09-08 (×2): 2 via ORAL
  Filled 2022-09-06 (×2): qty 2

## 2022-09-06 MED ORDER — FENTANYL CITRATE (PF) 100 MCG/2ML IJ SOLN: 25.0000 ug | INTRAMUSCULAR | Status: DC | PRN

## 2022-09-06 MED ORDER — LIDOCAINE-EPINEPHRINE (PF) 2 %-1:200000 IJ SOLN
INTRAMUSCULAR | Status: AC
  Filled 2022-09-06: qty 20

## 2022-09-06 SURGICAL SUPPLY — 33 items
BENZOIN TINCTURE PRP APPL 2/3 (GAUZE/BANDAGES/DRESSINGS) ×1 IMPLANT
CHLORAPREP W/TINT 26ML (MISCELLANEOUS) ×2 IMPLANT
CLAMP UMBILICAL CORD (MISCELLANEOUS) ×1 IMPLANT
CLOTH BEACON ORANGE TIMEOUT ST (SAFETY) ×1 IMPLANT
DRSG OPSITE POSTOP 4X10 (GAUZE/BANDAGES/DRESSINGS) ×1 IMPLANT
ELECT REM PT RETURN 9FT ADLT (ELECTROSURGICAL) ×1
ELECTRODE REM PT RTRN 9FT ADLT (ELECTROSURGICAL) ×1 IMPLANT
EXTRACTOR VACUUM M CUP 4 TUBE (SUCTIONS) IMPLANT
GAUZE SPONGE 4X4 12PLY STRL LF (GAUZE/BANDAGES/DRESSINGS) IMPLANT
GLOVE BIOGEL PI IND STRL 7.0 (GLOVE) ×2 IMPLANT
GLOVE BIOGEL PI IND STRL 7.5 (GLOVE) ×2 IMPLANT
GLOVE ECLIPSE 7.5 STRL STRAW (GLOVE) ×1 IMPLANT
GOWN STRL REUS W/TWL LRG LVL3 (GOWN DISPOSABLE) ×3 IMPLANT
KIT ABG SYR 3ML LUER SLIP (SYRINGE) IMPLANT
MAT PREVALON FULL STRYKER (MISCELLANEOUS) IMPLANT
NDL HYPO 25X5/8 SAFETYGLIDE (NEEDLE) IMPLANT
NEEDLE HYPO 25X5/8 SAFETYGLIDE (NEEDLE) IMPLANT
NS IRRIG 1000ML POUR BTL (IV SOLUTION) ×1 IMPLANT
PACK C SECTION WH (CUSTOM PROCEDURE TRAY) ×1 IMPLANT
PAD ABD 7.5X8 STRL (GAUZE/BANDAGES/DRESSINGS) IMPLANT
PAD OB MATERNITY 4.3X12.25 (PERSONAL CARE ITEMS) ×1 IMPLANT
RTRCTR C-SECT PINK 25CM LRG (MISCELLANEOUS) ×1 IMPLANT
STRIP CLOSURE SKIN 1/2X4 (GAUZE/BANDAGES/DRESSINGS) ×1 IMPLANT
SUT PLAIN 0 NONE (SUTURE) ×1 IMPLANT
SUT VIC AB 0 CT1 36 (SUTURE) ×3 IMPLANT
SUT VIC AB 0 CTX 36 (SUTURE) ×1
SUT VIC AB 0 CTX36XBRD ANBCTRL (SUTURE) ×1 IMPLANT
SUT VIC AB 2-0 CT1 27 (SUTURE) ×1
SUT VIC AB 2-0 CT1 TAPERPNT 27 (SUTURE) ×1 IMPLANT
SUT VIC AB 4-0 KS 27 (SUTURE) ×1 IMPLANT
TOWEL OR 17X24 6PK STRL BLUE (TOWEL DISPOSABLE) ×1 IMPLANT
TRAY FOLEY W/BAG SLVR 14FR LF (SET/KITS/TRAYS/PACK) ×1 IMPLANT
WATER STERILE IRR 1000ML POUR (IV SOLUTION) ×1 IMPLANT

## 2022-09-06 NOTE — Discharge Summary (Addendum)
Postpartum Discharge Summary     Patient Name: Jennifer Cain DOB: 17-Sep-1996 MRN: [id number]  Date of admission: 09/04/2022 Delivery date:09/06/2022  Delivering provider: Laurey Arrow [place]  Date of discharge: 09/08/2022  Admitting diagnosis: Encounter for elective induction of labor [Z34.90] Intrauterine pregnancy: [redacted]w[redacted]d    Secondary diagnosis:  Principal Problem:   S/P cesarean section Active Problems:   Encounter for elective induction of labor   Failed induction of labor   Acute blood loss as cause of postoperative anemia   Postpartum hypertension  Additional problems: None   Discharge diagnosis: Term Pregnancy Delivered                                              Post partum procedures: Venofer infusion Augmentation: AROM, Pitocin, Cytotec, and IP Foley Complications: None  Hospital course: Induction of Labor With Cesarean Section   26y.o. yo G1P0000 at 324w2das admitted to the hospital 09/04/2022 for induction of labor. Patient had a labor course significant for failed induction (pit for >24 hours, ruptured for close to 24 hours, >12 hours without cervical change, in latent labor). The patient went for cesarean section for failed induction in the setting of intermittent runs of late decelerations. Delivery details are as follows: Membrane Rupture Time/Date: 7:17 PM ,09/05/2022   Delivery Method:C-Section, Low Transverse  Details of operation can be found in separate operative Note.  Patient had a a postpartum course complicated by acute blood loss anemia, her hemoglobin went from 9.6 to 7.6. She was asymptomatic and received Venofer. She was also noted to have some mildly elevated BPs, no severe symptoms. No antepartum HTN issues.  She was started on Nifedipine XR 30 mg daily and Lasix 20 mg daily.   She is ambulating, tolerating a regular diet, passing flatus, and urinating well.  Patient is discharged home in stable condition on 09/08/22 with plan for follow up in one  week for BP check.      Newborn Data: Birth date:09/06/2022  Birth time:3:37 PM  Gender:Female  Living status:Living  Apgars:9 ,9  Weight:3310 g                                Physical exam  Vitals:   09/07/22 1347 09/07/22 2006 09/08/22 0528 09/08/22 0720  BP: 127/80 117/85 (!) 128/95 (!) 145/98  Pulse: 93 66 (!) 56 60  Resp: 18 17 17    Temp: 98.5 F (36.9 C) 99.1 F (37.3 C) 98.2 F (36.8 C)   TempSrc: Oral Oral Oral   SpO2: 100% 100% 100%   Weight:      Height:       General: alert, cooperative, and no distress Lochia: appropriate Uterine Fundus: firm Incision: Healing well with no significant drainage, Dressing is clean, dry, and intact DVT Evaluation: No evidence of DVT seen on physical exam. Negative Homan's sign. No cords or calf tenderness. No significant calf/ankle edema. Labs: Lab Results  Component Value Date   WBC 9.0 09/07/2022   HGB 7.6 (L) 09/07/2022   HCT 23.1 (L) 09/07/2022   MCV 80.2 09/07/2022   PLT 192 09/07/2022      Latest Ref Rng & Units 02/08/2022   11:23 AM  CMP  Glucose 70 - 99 mg/dL 72   BUN 6 - 20 mg/dL 9  Creatinine 0.57 - 1.00 mg/dL 0.69   Sodium 134 - 144 mmol/L 136   Potassium 3.5 - 5.2 mmol/L 4.3   Chloride 96 - 106 mmol/L 102   CO2 20 - 29 mmol/L 17   Calcium 8.7 - 10.2 mg/dL 9.6   Total Protein 6.0 - 8.5 g/dL 8.1   Total Bilirubin 0.0 - 1.2 mg/dL 0.4   Alkaline Phos 44 - 121 IU/L 63   AST 0 - 40 IU/L 12   ALT 0 - 32 IU/L 9    Edinburgh Score:    09/06/2022    5:23 PM  Edinburgh Postnatal Depression Scale Screening Tool  I have been able to laugh and see the funny side of things. 0  I have looked forward with enjoyment to things. 0  I have blamed myself unnecessarily when things went wrong. 0  I have been anxious or worried for no good reason. 1  I have felt scared or panicky for no good reason. 0  Things have been getting on top of me. 0  I have been so unhappy that I have had difficulty sleeping. 0  I have felt  sad or miserable. 0  I have been so unhappy that I have been crying. 0  The thought of harming myself has occurred to me. 0  Edinburgh Postnatal Depression Scale Total 1     After visit meds:  Allergies as of 09/08/2022   No Known Allergies      Medication List     STOP taking these medications    metoCLOPramide 10 MG tablet Commonly known as: Reglan       TAKE these medications    acetaminophen 500 MG tablet Commonly known as: TYLENOL Take 2 tablets (1,000 mg total) by mouth every 6 (six) hours as needed for moderate pain or mild pain.   ferrous sulfate 325 (65 FE) MG EC tablet Take 1 tablet (325 mg total) by mouth every other day.   furosemide 20 MG tablet Commonly known as: LASIX Take 1 tablet (20 mg total) by mouth daily for 5 days.   hydrocortisone 25 MG suppository Commonly known as: ANUSOL-HC Place 1 suppository (25 mg total) rectally 2 (two) times daily.   ibuprofen 600 MG tablet Commonly known as: ADVIL Take 1 tablet (600 mg total) by mouth every 6 (six) hours as needed for moderate pain or cramping.   multivitamin-prenatal 27-0.8 MG Tabs tablet Take 1 tablet by mouth daily   NIFEdipine 30 MG 24 hr tablet Commonly known as: ADALAT CC Take 1 tablet (30 mg total) by mouth daily.   oxyCODONE 5 MG immediate release tablet Commonly known as: Oxy IR/ROXICODONE Take 1 tablet (5 mg total) by mouth every 6 (six) hours as needed for severe pain or breakthrough pain.   senna-docusate 8.6-50 MG tablet Commonly known as: Senokot-S Take 2 tablets by mouth at bedtime as needed for mild constipation or moderate constipation.               Discharge Care Instructions  (From admission, onward)           Start     Ordered   09/08/22 0000  Discharge wound care:       Comments: As per discharge handout and nursing instructions   09/08/22 0703            Discharge home in stable condition Infant Feeding: Breast Infant Disposition:home with  mother Discharge instruction: per After Visit Summary and Postpartum booklet. Activity: Advance as  tolerated. Pelvic rest for 6 weeks.  Diet: routine diet Planned contraception: IUD in office Future Appointments  Date Time Provider [place]  10/10/2022  1:45 PM Clarnce Flock, MD [facility]  BP check in one week   09/08/2022 Verita Schneiders, MD

## 2022-09-06 NOTE — Progress Notes (Signed)
Labor Progress Note Jennifer Cain is a 26 y.o. G1P0000 at 24w1dpresented for IOL for polyhydramnios, resolved FGR.   S: Doing well no concerns. Not feeling contractions.   O:  BP 124/80   Pulse 75   Temp 97.7 F (36.5 C) (Oral)   Resp 18   Ht 5' 3"  (1.6 m)   Wt 77.1 kg   LMP 11/16/2021 Comment: neg urine preg test on admission  BMI 30.11 kg/m  EFM: 135/good variability/accels, early decels CVE: Dilation: 4 Effacement (%): 60 Cervical Position: Posterior Station: -2 Presentation: Vertex Exam by:: [name], MD  A&P: 26y.o. G1P0000 345w1der for IOL for polyhydramnios/ resolved FGR.  #Labor: Progressing, IUPC placed, recurrent variables. Consider need for amnioinfusion 300 bolus then 150/h if variable continue. MVUs 290.  #Pain: Maternally supported, epidural when needed #FWB: category I, recurrent variables improved to early decels #GBS negative #Hx of Crohn's. No signs of active flare, not on meds currently  SiGerlene FeeDOBrownsvilleor WoMcCook:10 AM

## 2022-09-06 NOTE — Anesthesia Postprocedure Evaluation (Signed)
Anesthesia Post Note  Patient: Jennifer Cain  Procedure(s) Performed: War     Patient location during evaluation: PACU Anesthesia Type: Epidural Level of consciousness: oriented and awake and alert Pain management: pain level controlled Vital Signs Assessment: post-procedure vital signs reviewed and stable Respiratory status: spontaneous breathing, respiratory function stable and nonlabored ventilation Cardiovascular status: blood pressure returned to baseline and stable Postop Assessment: no headache, no backache, no apparent nausea or vomiting, patient able to bend at knees and epidural receding Anesthetic complications: no   No notable events documented.  Last Vitals:  Vitals:   09/06/22 1701 09/06/22 1723  BP:  133/84  Pulse: 87 80  Resp:  18  Temp:  36.9 C  SpO2: 100% 100%    Last Pain:  Vitals:   09/06/22 1723  TempSrc: Oral  PainSc: 0-No pain   Pain Goal: Patients Stated Pain Goal: Other (Comment) (patient is undecided, feels like she is doing well, declines intervention at this time) (09/05/22 1530)  LLE Motor Response: Purposeful movement (09/06/22 1715) LLE Sensation: Numbness (09/06/22 1715) RLE Motor Response: Purposeful movement (09/06/22 1715) RLE Sensation: Numbness (09/06/22 1715)     Epidural/Spinal Function Cutaneous sensation: Normal sensation (09/06/22 1723), Patient able to flex knees: Yes (09/06/22 1715), Patient able to lift hips off bed: Yes (09/06/22 1715), Back pain beyond tenderness at insertion site: No (09/06/22 1715), Progressively worsening motor and/or sensory loss: No (09/06/22 1715), Bowel and/or bladder incontinence post epidural: No (09/06/22 1715)  Hira Trent A.

## 2022-09-06 NOTE — Anesthesia Postprocedure Evaluation (Signed)
Anesthesia Post Note  Patient: Jennifer Cain  Procedure(s) Performed: AN AD Martinez     Patient location during evaluation: Mother Baby Anesthesia Type: Epidural Level of consciousness: awake and alert Pain management: pain level controlled Vital Signs Assessment: post-procedure vital signs reviewed and stable Respiratory status: spontaneous breathing, nonlabored ventilation and respiratory function stable Cardiovascular status: stable Postop Assessment: no headache, no backache and epidural receding Anesthetic complications: no   No notable events documented.  Last Vitals:  Vitals:   09/06/22 1723 09/06/22 1828  BP: 133/84 128/86  Pulse: 80 88  Resp: 18 18  Temp: 36.9 C 36.9 C  SpO2: 100% 100%    Last Pain:  Vitals:   09/06/22 1828  TempSrc: Oral  PainSc: 0-No pain   Pain Goal: Patients Stated Pain Goal: Other (Comment) (patient is undecided, feels like she is doing well, declines intervention at this time) (09/05/22 1530)                 [facility]

## 2022-09-06 NOTE — Anesthesia Procedure Notes (Signed)
Epidural Patient location during procedure: OB Start time: 09/06/2022 7:11 AM End time: 09/06/2022 7:20 AM  Staffing Anesthesiologist: Josephine Igo, MD  Preanesthetic Checklist Completed: patient identified, IV checked, site marked, risks and benefits discussed, surgical consent, monitors and equipment checked, pre-op evaluation and timeout performed  Epidural Patient position: sitting Prep: DuraPrep and site prepped and draped Patient monitoring: continuous pulse ox and blood pressure Approach: midline Location: L3-L4 Injection technique: LOR saline  Needle:  Needle type: Tuohy  Needle gauge: 17 G Needle length: 9 cm and 9 Needle insertion depth: 6 cm Catheter type: closed end flexible Catheter size: 19 Gauge Catheter at skin depth: 10 cm Test dose: negative and Other  Assessment Events: blood not aspirated, injection not painful, no injection resistance, no paresthesia and negative IV test  Additional Notes Patient identified. Risks and benefits discussed including failed block, incomplete  Pain control, post dural puncture headache, nerve damage, paralysis, blood pressure Changes, nausea, vomiting, reactions to medications-both toxic and allergic and post Partum back pain. All questions were answered. Patient expressed understanding and wished to proceed. Sterile technique was used throughout procedure. Epidural site was Dressed with sterile barrier dressing. No paresthesias, signs of intravascular injection Or signs of intrathecal spread were encountered.  Patient was more comfortable after the epidural was dosed. Please see RN's note for documentation of vital signs and FHR which are stable. Reason for block:procedure for pain

## 2022-09-06 NOTE — Transfer of Care (Signed)
Immediate Anesthesia Transfer of Care Note  Patient: Jennifer Cain  Procedure(s) Performed: CESAREAN SECTION  Patient Location: PACU  Anesthesia Type:Epidural  Level of Consciousness: awake, alert  and oriented  Airway & Oxygen Therapy: Patient Spontanous Breathing  Post-op Assessment: Report given to RN and Post -op Vital signs reviewed and stable  Post vital signs: Reviewed and stable  Last Vitals:  Vitals Value Taken Time  BP 118/81 09/06/22 1621  Temp 37.1 C 09/06/22 1621  Pulse 108 09/06/22 1624  Resp 19 09/06/22 1624  SpO2 100 % 09/06/22 1624  Vitals shown include unvalidated device data.  Last Pain:  Vitals:   09/06/22 1430  TempSrc: Oral  PainSc: 0-No pain      Patients Stated Pain Goal: Other (Comment) (patient is undecided, feels like she is doing well, declines intervention at this time) (11/12/48 7530)  Complications: No notable events documented.

## 2022-09-06 NOTE — Progress Notes (Signed)
Jennifer Cain is a 26 y.o. G1P0000 at 29w2dby 16 week ultrasound admitted for induction of labor due to polyhydramnios.  Subjective: Pt comfortable with epidural. Family   Objective: BP 125/79   Pulse 94   Temp 98.5 F (36.9 C) (Oral)   Resp 16   Ht 5' 3"  (1.6 m)   Wt 77.1 kg   LMP 11/16/2021 Comment: neg urine preg test on admission  SpO2 100%   BMI 30.11 kg/m  No intake/output data recorded. No intake/output data recorded.  FHT:  FHR: 135 bpm, variability: moderate,  accelerations:  Present,  decelerations:  Present late and early decels UC:   regular, every 3 minutes SVE:   Dilation: 4 Effacement (%): 90 Station: -2 Exam by:: leftwitch kirby  Labs: Lab Results  Component Value Date   WBC 5.3 09/04/2022   HGB 9.6 (L) 09/04/2022   HCT 29.4 (L) 09/04/2022   MCV 81.0 09/04/2022   PLT 256 09/04/2022    Assessment / Plan: {CHL LABOR ASSESSMENT:21627}  Labor: {CHL LABOR PROGRESS:21622} Preeclampsia:  {CHL PREECLAMPSIA PLAN:21623} Fetal Wellbeing:  {CHL FWB PLAN:21624} Pain Control:  {CHL FWB PLAN:21625} I/D:  {NA AND WMEQASTMH:96222}Anticipated MOD:  {{LNL:89211} LFatima Blank CNM 09/06/2022, 12:10 PM

## 2022-09-06 NOTE — Anesthesia Preprocedure Evaluation (Addendum)
Anesthesia Evaluation  Patient identified by MRN, date of birth, ID band Patient awake    Reviewed: Allergy & Precautions, NPO status , Patient's Chart, lab work & pertinent test results  Airway Mallampati: II  TM Distance: >3 FB Neck ROM: Full    Dental no notable dental hx.    Pulmonary neg pulmonary ROS,    Pulmonary exam normal breath sounds clear to auscultation       Cardiovascular negative cardio ROS Normal cardiovascular exam Rhythm:Regular Rate:Normal     Neuro/Psych negative neurological ROS  negative psych ROS   GI/Hepatic negative GI ROS, Neg liver ROS, Crohn's   Endo/Other  negative endocrine ROS  Renal/GU negative Renal ROS  negative genitourinary   Musculoskeletal negative musculoskeletal ROS (+)   Abdominal   Peds  Hematology  (+) Blood dyscrasia, anemia ,   Anesthesia Other Findings IOL for polyhydramnios   Reproductive/Obstetrics (+) Pregnancy                             Anesthesia Physical Anesthesia Plan  ASA: 2 and emergent  Anesthesia Plan: Epidural   Post-op Pain Management:    Induction:   PONV Risk Score and Plan: Treatment may vary due to age or medical condition  Airway Management Planned: Natural Airway  Additional Equipment:   Intra-op Plan:   Post-operative Plan:   Informed Consent: I have reviewed the patients History and Physical, chart, labs and discussed the procedure including the risks, benefits and alternatives for the proposed anesthesia with the patient or authorized representative who has indicated his/her understanding and acceptance.       Plan Discussed with: Anesthesiologist  Anesthesia Plan Comments: (Patient identified. Risks, benefits, options discussed with patient including but not limited to bleeding, infection, nerve damage, paralysis, failed block, incomplete pain control, headache, blood pressure changes, nausea,  vomiting, reactions to medication, itching, and post partum back pain. Confirmed with bedside nurse the patient's most recent platelet count. Confirmed with the patient that they are not taking any anticoagulation, have any bleeding history or any family history of bleeding disorders. Patient expressed understanding and wishes to proceed. All questions were answered.   Patient for urgent C/Section for non reassuring FHR tracing. Will use epidural for C/Section. M. Royce Macadamia, MD)       Anesthesia Quick Evaluation

## 2022-09-06 NOTE — Progress Notes (Signed)
Patient is a 26 yo g1 @ 56+8, pregnancy complicated by crohn's disease, induced at term for polyhydramnios. Patient was admitted about 48 hours ago. She has been on pitocin for greater than 24 hours and has been ruptured for close to 24 hours. She has been 4 cm for at least 12 hours. She also has had several runs of recurrent late decelerations though these tend to resolve and patient has good variability and positive scalp stim. Discussed with patient that she meets criteria for failed induction and given nonreassuring fetal status, I advised proceeding to surgery, and she is agreeable.  The risks of cesarean section were discussed with the patient including but were not limited to: bleeding which may require transfusion or reoperation; infection which may require antibiotics; injury to bowel, bladder, ureters or other surrounding organs; injury to the fetus; need for additional procedures including hysterectomy in the event of a life-threatening hemorrhage; placental abnormalities wth subsequent pregnancies, incisional problems, thromboembolic phenomenon and other postoperative/anesthesia complications.  Anesthesia and OR aware.  Preoperative prophylactic antibiotics and SCDs ordered on call to the OR.  To OR when ready.

## 2022-09-06 NOTE — Op Note (Signed)
Cesarean Section Operative Report  Jennifer Cain  09/04/2022 - 09/06/2022  Indications: failed induction, fetal indications  Pre-operative Diagnosis: primary low transverse cesarean section  Post-operative Diagnosis: Same   Surgeon: Surgeon(s) and Role:    * Adjoa Althouse, Ailene Rud, MD - Primary       Deloria Lair, DO - assisting  Attending Attestation: I was present and scrubbed for the entire procedure.   An experienced assistant was required given the standard of surgical care given the complexity of the case.  This assistant was needed for exposure, dissection, suctioning, retraction, instrument exchange, assisting with delivery with administration of fundal pressure, and for overall help during the procedure.  Anesthesia: epidural    Estimated Blood Loss: 500 ml  Total IV Fluids: 1300 ml LR  Urine Output:: 100 ml dark yellow urine  Specimens: none  Findings: Viable female infant in cephalic presentation; Apgars pending; weight pending; arterial cord pH not obtained;  clear amniotic fluid; intact placenta with three vessel cord.  Baby condition / location:  Couplet care / Skin to Skin   Complications: no complications  Indications: Jennifer Leahy is a 26 y.o. G1P0000 with an IUP 59w2dpresenting for IOL for polyhydramnios. Failed induction (pit for greater than 24 hours, ruptured for close to 24 hours, no cervical change for 12+ hours), with intermittent runs of late decels.  The risks, benefits, complications, treatment options, and exected outcomes were discussed with the patient . The patient dwith the proposed plan, giving informed consent. identified as [place] and the procedure verified as C-Section Delivery.  Procedure Details:  The patient was taken back to the operative suite where epidural anesthesia was dosed.  A time out was held and the above information confirmed.   After induction of anesthesia, the patient was draped and prepped in the usual sterile  manner and placed in a dorsal supine position with a leftward tilt. A Pfannenstiel incision was made and carried down through the subcutaneous tissue to the fascia. Fascial incision was made and bluntly extended transversely. The fascia was separated from the underlying rectus tissue superiorly and inferiorly. The peritoneum was identified and sharply entered and extended longitudinally. Alexis retractor was placed. A bladder flap was not created. A low transverse uterine incision was made and extended bluntly. Delivered from cephalic presentation was a viable infant with Apgars and weight as above.  After waiting 60 seconds for delayed cord cutting, the umbilical cord was clamped and cut cord blood was obtained for evaluation. Cord ph was not sent. The placenta was removed Intact and appeared normal. The uterine incision was closed with running unlocked sutures 0-Vicryl in one layer.   Hemostasis was observed. The rectus muscles were examined and hemostasis observed. The fascia was then reapproximated with running sutures of 0-Vicryl. The skin was closed with 4-0 Vicryl.  Instrument, sponge, and needle counts were correct prior the abdominal closure and were correct at the conclusion of the case.     Disposition: PACU - hemodynamically stable.   Maternal Condition: stable       Signed: NEnnis Forts10/19/2023 9:29 AM

## 2022-09-06 NOTE — Progress Notes (Signed)
Labor Progress Note Jennifer Cain is a 26 y.o. G1P0000 at 69w1dpresented for IOL for polyhydramnios, resolved FGR.   S: Starting to get uncomfortable with her contractions.   O:  BP 114/69   Pulse 79   Temp 98.3 F (36.8 C) (Oral)   Resp 18   Ht 5' 3"  (1.6 m)   Wt 77.1 kg   LMP 11/16/2021 Comment: neg urine preg test on admission  BMI 30.11 kg/m  EFM: 130/good variability/accels, early decels CVE: Dilation: 4 Effacement (%): 70 Cervical Position: Posterior Station: -2 Presentation: Vertex Exam by:: [name], DO  A&P: 26y.o. G1P0000 371w1der for IOL for polyhydramnios/ resolved FGR.  #Labor: Slowly progressing, IUPC in place, MVUs 190. On 18 units of pit. Can continue to increase pitocin after epidural.  #Pain: Maternally supported, epidural planning now #FWB: category I #GBS negative #Hx of Crohn's. No signs of active flare, not on meds currently  [name]:35 AM

## 2022-09-07 LAB — CBC
HCT: 23.1 % — ABNORMAL LOW (ref 36.0–46.0)
Hemoglobin: 7.6 g/dL — ABNORMAL LOW (ref 12.0–15.0)
MCH: 26.4 pg (ref 26.0–34.0)
MCHC: 32.9 g/dL (ref 30.0–36.0)
MCV: 80.2 fL (ref 80.0–100.0)
Platelets: 192 10*3/uL (ref 150–400)
RBC: 2.88 MIL/uL — ABNORMAL LOW (ref 3.87–5.11)
RDW: 13.5 % (ref 11.5–15.5)
WBC: 9 10*3/uL (ref 4.0–10.5)
nRBC: 0 % (ref 0.0–0.2)

## 2022-09-07 MED ORDER — SODIUM CHLORIDE 0.9 % IV SOLN
500.0000 mg | Freq: Once | INTRAVENOUS | Status: AC
  Administered 2022-09-07: 500 mg via INTRAVENOUS
  Filled 2022-09-07: qty 25

## 2022-09-07 NOTE — Lactation Note (Signed)
This note was copied from a baby's chart. Lactation Consultation Note  Patient Name: Boy [place] Buick Today's Date: 09/07/2022   Age:26 hours Attempted to see mom but she was sleeping.  Maternal Data    Feeding    LATCH Score                    Lactation Tools Discussed/Used    Interventions    Discharge    Consult Status      Khrystyne Arpin, Elta [place] 09/07/2022, 2:19 AM

## 2022-09-07 NOTE — Progress Notes (Signed)
POSTPARTUM PROGRESS NOTE  Post Partum Day 1  Subjective:  Jennifer Cain is a 26 y.o. G1P1001 s/p pLTCS at [redacted]w[redacted]d  No acute events overnight.  Pt denies problems with ambulating, voiding or po intake.  She denies nausea or vomiting.  Pain is well controlled.  She has had flatus. She has not had bowel movement.  Lochia Small.   Objective: Blood pressure 117/85, pulse 68, temperature 99.3 F (37.4 C), temperature source Oral, resp. rate 17, height 5' 3"  (1.6 m), weight 77.1 kg, last menstrual period 11/16/2021, SpO2 100 %, unknown if currently breastfeeding.  Physical Exam:  General: alert, cooperative and no distress Chest: no respiratory distress Heart:regular rate, distal pulses intact Abdomen: soft, nontender,  Uterine Fundus: firm, appropriately tender DVT Evaluation: No calf swelling or tenderness Extremities: No edema Skin: warm, dry; incision clean/dry/intact  Recent Labs    09/04/22 1553 09/07/22 0606  HGB 9.6* 7.6*  HCT 29.4* 23.1*    Assessment/Plan: Jennifer Cain is a 26y.o. G1P1001 s/p pLTCS at 316w2d PPD#1 - Doing well, Asymptomatic but low hemoglobin to 7.6, treating with IV venofer 500 mg Contraception: IUD OP Feeding: breast/bottle Dispo: Plan for discharge tomorrow.   LOS: 3 days   Jennifer LairDO, CNM 09/07/2022, 9:07 AM

## 2022-09-07 NOTE — Lactation Note (Signed)
This note was copied from a baby's chart. Lactation Consultation Note  Patient Name: Jennifer Cain Today's Date: 09/07/2022 Reason for consult: Initial assessment;Primapara;1st time breastfeeding;Term Age:26 hours  LC in to visit with P86 Mom of term baby delivered by C/S.  Baby is at a 5% weight loss with 1 void recorded.  Mom reports she doesn't know if baby is latching to her well.  Offered to assist and assess baby's positioning and latch.  Baby asleep swaddled in crib.  Baby alert once swaddled removed.  Reviewed hand expression and colostrum drops noted.  Baby placed in laid back position prone on Mom and he started showing feeding cues. LC sat Mom more upright and placed pillows to elevate baby for football hold on left breast.  After a couple attempts, baby latched deeply with nutritive sucking and swallowing identified. Lots of basic teaching done.   Encouraged Mom to awaken baby at least every 3 hrs to feed, asking for help with breastfeeding prn.    Encouraged STS and feeding often with cues.  Mom had asked for formula, but hasn't given it yet.  Encouraged asking for help with latching as needed.    Maternal Data Has patient been taught Hand Expression?: Yes Does the patient have breastfeeding experience prior to this delivery?: No  Feeding Mother's Current Feeding Choice: Breast Milk  LATCH Score Latch: Grasps breast easily, tongue down, lips flanged, rhythmical sucking.  Audible Swallowing: Spontaneous and intermittent  Type of Nipple: Everted at rest and after stimulation  Comfort (Breast/Nipple): Soft / non-tender  Hold (Positioning): Assistance needed to correctly position infant at breast and maintain latch.  LATCH Score: 9   Interventions Interventions: Breast feeding basics reviewed;Assisted with latch;Skin to skin;Breast massage;Hand express;Adjust position;Support pillows;Position options;LC Services brochure  Discharge Pump:  Personal;DEBP  Consult Status Consult Status: Follow-up Date: 09/08/22 Follow-up type: [place] 09/07/2022, 8:59 AM

## 2022-09-08 ENCOUNTER — Other Ambulatory Visit (HOSPITAL_COMMUNITY): Payer: Self-pay

## 2022-09-08 DIAGNOSIS — Z98891 History of uterine scar from previous surgery: Principal | ICD-10-CM

## 2022-09-08 DIAGNOSIS — D62 Acute posthemorrhagic anemia: Secondary | ICD-10-CM | POA: Diagnosis present

## 2022-09-08 DIAGNOSIS — O165 Unspecified maternal hypertension, complicating the puerperium: Secondary | ICD-10-CM | POA: Diagnosis not present

## 2022-09-08 MED ORDER — FUROSEMIDE 20 MG PO TABS
20.0000 mg | ORAL_TABLET | Freq: Every day | ORAL | Status: DC
  Administered 2022-09-08: 20 mg via ORAL
  Filled 2022-09-08: qty 1

## 2022-09-08 MED ORDER — OXYCODONE HCL 5 MG PO TABS
5.0000 mg | ORAL_TABLET | Freq: Four times a day (QID) | ORAL | 0 refills | Status: DC | PRN
  Filled 2022-09-08: qty 20, 5d supply, fill #0
  Filled 2022-10-02 – 2022-10-20 (×3): qty 20, 5d supply, fill #1

## 2022-09-08 MED ORDER — SENNOSIDES-DOCUSATE SODIUM 8.6-50 MG PO TABS: 2.0000 | ORAL_TABLET | Freq: Every evening | ORAL | 0 refills | Status: DC | PRN

## 2022-09-08 MED ORDER — SENNOSIDES-DOCUSATE SODIUM 8.6-50 MG PO TABS
2.0000 | ORAL_TABLET | Freq: Every evening | ORAL | 0 refills | Status: DC | PRN
  Filled 2022-09-08: qty 30, 15d supply, fill #0

## 2022-09-08 MED ORDER — IBUPROFEN 600 MG PO TABS
600.0000 mg | ORAL_TABLET | Freq: Four times a day (QID) | ORAL | 1 refills | Status: DC | PRN
  Filled 2022-09-08: qty 60, 15d supply, fill #0

## 2022-09-08 MED ORDER — FUROSEMIDE 20 MG PO TABS
20.0000 mg | ORAL_TABLET | Freq: Every day | ORAL | 0 refills | Status: DC
  Filled 2022-09-08: qty 5, 5d supply, fill #0

## 2022-09-08 MED ORDER — OXYCODONE HCL 5 MG PO TABS: 5.0000 mg | ORAL_TABLET | Freq: Four times a day (QID) | ORAL | 0 refills | Status: DC | PRN

## 2022-09-08 MED ORDER — IBUPROFEN 600 MG PO TABS: 600.0000 mg | ORAL_TABLET | Freq: Four times a day (QID) | ORAL | 2 refills | Status: DC | PRN

## 2022-09-08 MED ORDER — NIFEDIPINE ER OSMOTIC RELEASE 30 MG PO TB24
30.0000 mg | ORAL_TABLET | Freq: Every day | ORAL | Status: DC
  Administered 2022-09-08: 30 mg via ORAL
  Filled 2022-09-08: qty 1

## 2022-09-08 MED ORDER — ACETAMINOPHEN 500 MG PO TABS: 1000.0000 mg | ORAL_TABLET | Freq: Four times a day (QID) | ORAL | 2 refills | Status: AC | PRN

## 2022-09-08 MED ORDER — NIFEDIPINE ER 30 MG PO TB24
30.0000 mg | ORAL_TABLET | Freq: Every day | ORAL | 0 refills | Status: DC
  Filled 2022-09-08: qty 30, 30d supply, fill #0

## 2022-09-08 NOTE — Progress Notes (Signed)
MD updated about BP WLN, may resume discharge.

## 2022-09-08 NOTE — Lactation Note (Signed)
This note was copied from a baby's chart. Lactation Consultation Note  Patient Name: Jennifer Cain Today's Date: 09/08/2022 - Age - 31 hours  Reason for consult: Primapara;1st time breastfeeding;Term;Infant weight loss (7 % weight loss, LC reviewed the doc flow sheets, Wets and stools correlate with the 7 % weight loss. LC reviewed BF D/C teaching and [place] resources after D/C. LC encouraged mom to offer the br 1st and of still hungry offer the 2nd Br.) If baby to fussy to latch , give and appetizer of EBM or formula 10 ml and then offer the breast.  Per mom has a DEBP at home.  Mom aware of the [facility] resources after D/C.   Maternal Data    Feeding Mother's Current Feeding Choice: Breast Milk and Formula Nipple Type: Slow - flow  LATCH Score  Lactation Tools Discussed/Used    Interventions Interventions: Breast feeding basics reviewed;Education;LC Services brochure  Discharge Discharge Education: Engorgement and breast care;Warning signs for feeding baby Pump: DEBP;Personal  Consult Status Consult Status: Complete Date: 09/08/22    Myer Haff 09/08/2022, 3:40 PM

## 2022-09-08 NOTE — Progress Notes (Signed)
Dr Harolyn Rutherford rounding with patient. Orders obtained for elevated BP. Patient does not have vision changes or headache.

## 2022-09-08 NOTE — Progress Notes (Signed)
Dynamap used to take patient's BP: 135/89 compared to portable blood pressure cuff given to patient for monitoring BP: 149/88

## 2022-09-08 NOTE — Progress Notes (Signed)
Dr. Kerrie Pleasure called and would like patient to rest and have 4 BP checked in the next 2 hours. Call MD for BP greater than 135/90. Patient resting. Boyfriend left with family members.

## 2022-09-08 NOTE — Progress Notes (Signed)
Having difficulty enrolling patient in Hypertension Baby Scripts, called made by patient with RN in room to their customer service to get patient enrolled. Patient had enrolled in the Postpartum Baby Scripts which was causing problems. Once patient was enrolled and patient educated about blood pressure cuff and entering BP, boyfriend starting arguing with patient and she became upset. BP was elevated and time given to patient to rest but BP still remains elevated. BP have ranged from 147/87 to 151/98. Call to provider.

## 2022-09-11 ENCOUNTER — Other Ambulatory Visit: Payer: Self-pay

## 2022-09-11 ENCOUNTER — Encounter: Payer: Self-pay | Admitting: Family Medicine

## 2022-09-13 ENCOUNTER — Ambulatory Visit: Payer: Medicaid Other

## 2022-09-15 ENCOUNTER — Other Ambulatory Visit: Payer: Self-pay | Admitting: Obstetrics & Gynecology

## 2022-09-19 ENCOUNTER — Ambulatory Visit (INDEPENDENT_AMBULATORY_CARE_PROVIDER_SITE_OTHER): Payer: Medicaid Other

## 2022-09-19 VITALS — BP 133/84 | HR 79

## 2022-09-19 DIAGNOSIS — O165 Unspecified maternal hypertension, complicating the puerperium: Secondary | ICD-10-CM

## 2022-09-19 NOTE — Progress Notes (Signed)
Blood Pressure Check Visit  Jennifer Cain is here for blood pressure check following C-section with labor on 09/06/2022. BP today is 133/84. Patient denies any dizzness blurred vision headache shortness of breath peripheral edema. Reviewed with Lamount Cohen MD. Pt PP visit is scheduled for 11/21. He advised pt stop taking BP medication 3 days prior to appointment to see how blood pressure looks without medication. Pt agreeable with plan of care and verbalized understanding.   Darlyne Russian, RN  09/19/2022  9:03 AM

## 2022-09-28 ENCOUNTER — Other Ambulatory Visit: Payer: Self-pay | Admitting: Obstetrics & Gynecology

## 2022-10-02 ENCOUNTER — Other Ambulatory Visit (HOSPITAL_BASED_OUTPATIENT_CLINIC_OR_DEPARTMENT_OTHER): Payer: Self-pay

## 2022-10-04 ENCOUNTER — Other Ambulatory Visit (HOSPITAL_COMMUNITY): Payer: Self-pay

## 2022-10-04 ENCOUNTER — Other Ambulatory Visit (HOSPITAL_BASED_OUTPATIENT_CLINIC_OR_DEPARTMENT_OTHER): Payer: Self-pay

## 2022-10-09 ENCOUNTER — Other Ambulatory Visit (HOSPITAL_COMMUNITY): Payer: Self-pay

## 2022-10-10 ENCOUNTER — Ambulatory Visit: Payer: Medicaid Other | Admitting: Family Medicine

## 2022-10-20 ENCOUNTER — Other Ambulatory Visit (HOSPITAL_COMMUNITY): Payer: Self-pay

## 2022-10-25 NOTE — Progress Notes (Unsigned)
    [place] Partum Visit Note  Jennifer Cain is a 26 y.o. G50P1001 female who presents for a postpartum visit. She is {1-10:13787} {time; units:18646} postpartum following a {method of delivery:313099}.  I have fully reviewed the prenatal and intrapartum course. The delivery was at *** gestational weeks.  Anesthesia: {anesthesia types:812}. Postpartum course has been ***. Baby is doing well***. Baby is feeding by {breastmilk/bottle:69}. Bleeding {vag bleed:12292}. Bowel function is {normal:32111}. Bladder function is {normal:32111}. Patient {is/is not:9024} sexually active. Contraception method is {contraceptive method:5051}. Postpartum depression screening: {gen negative/positive:315881}.   The pregnancy intention screening data noted above was reviewed. Potential methods of contraception were discussed. The patient elected to proceed with No data recorded.    Health Maintenance Due  Topic Date Due   HPV VACCINES (1 - 2-dose series) Never done   INFLUENZA VACCINE  06/20/2022    {Common ambulatory SmartLinks:19316}  Review of Systems {ros; complete:30496}  Objective:  LMP 11/16/2021 Comment: neg urine preg test on admission   General:  {gen appearance:16600}   Breasts:  {desc; normal/abnormal/not indicated:14647}  Lungs: {lung exam:16931}  Heart:  {heart exam:5510}  Abdomen: {abdomen exam:16834}   Wound {Wound assessment:11097}  GU exam:  {desc; normal/abnormal/not indicated:14647}       Assessment:    There are no diagnoses linked to this encounter.  *** postpartum exam.   Plan:   Essential components of care per ACOG recommendations:  1.  Mood and well being: Patient with {gen negative/positive:315881} depression screening today. Reviewed local resources for support.  - Patient tobacco use? {tobacco use:25506}  - hx of drug use? {yes/no:25505}    2. Infant care and feeding:  -Patient currently breastmilk feeding? {yes/no:25502}  -Social determinants of health (SDOH)  reviewed in EPIC. No concerns***The following needs were identified***  3. Sexuality, contraception and birth spacing - Patient {DOES_DOES YDX:41287} want a pregnancy in the next year.  Desired family size is {NUMBER 1-10:22536} children.  - Reviewed reproductive life planning. Reviewed contraceptive methods based on pt preferences and effectiveness.  Patient desired {Upstream End Methods:24109} today.   - Discussed birth spacing of 18 months  4. Sleep and fatigue -Encouraged family/partner/community support of 4 hrs of uninterrupted sleep to help with mood and fatigue  5. Physical Recovery  - Discussed patients delivery and complications. She describes her labor as {description:25511} - Patient had a {CHL AMB [phone]}. Patient had a {laceration:25518} laceration. Perineal healing reviewed. Patient expressed understanding - Patient has urinary incontinence? {yes/no:25515} - Patient {ACTION; IS/IS OMV:[id number]} safe to resume physical and sexual activity  6.  Health Maintenance - HM due items addressed {Yes or If no, why not?:20788} - Last pap smear  Diagnosis  Date Value Ref Range Status  02/08/2022 (A)  Final   - Atypical squamous cells of undetermined significance (ASC-US)   Pap smear {done:10129} at today's visit.  -Breast Cancer screening indicated? {indicated:25516}  7. Chronic Disease/Pregnancy Condition follow up: {Follow up:25499}  - PCP follow up  Jake Seats, [place] for [place], [place]

## 2022-10-26 ENCOUNTER — Other Ambulatory Visit: Payer: Self-pay

## 2022-10-26 ENCOUNTER — Ambulatory Visit (INDEPENDENT_AMBULATORY_CARE_PROVIDER_SITE_OTHER): Payer: Medicaid Other | Admitting: Family Medicine

## 2022-10-26 ENCOUNTER — Encounter: Payer: Self-pay | Admitting: Family Medicine

## 2022-10-26 VITALS — BP 141/90 | HR 90 | Ht 62.0 in | Wt 150.6 lb

## 2022-10-26 DIAGNOSIS — Z30430 Encounter for insertion of intrauterine contraceptive device: Secondary | ICD-10-CM

## 2022-10-26 DIAGNOSIS — O165 Unspecified maternal hypertension, complicating the puerperium: Secondary | ICD-10-CM

## 2022-10-26 MED ORDER — NIFEDIPINE ER 60 MG PO TB24: 60.0000 mg | ORAL_TABLET | Freq: Every day | ORAL | 2 refills | Status: DC

## 2022-10-26 MED ORDER — LEVONORGESTREL 20.1 MCG/DAY IU IUD
1.0000 | INTRAUTERINE_SYSTEM | Freq: Once | INTRAUTERINE | Status: AC
  Administered 2022-10-26: 1 via INTRAUTERINE

## 2022-10-30 ENCOUNTER — Encounter: Payer: Self-pay | Admitting: *Deleted

## 2022-11-07 ENCOUNTER — Encounter: Payer: Medicaid Other | Admitting: Family Medicine

## 2023-01-19 ENCOUNTER — Encounter (HOSPITAL_BASED_OUTPATIENT_CLINIC_OR_DEPARTMENT_OTHER): Payer: Self-pay

## 2023-01-19 ENCOUNTER — Emergency Department (HOSPITAL_BASED_OUTPATIENT_CLINIC_OR_DEPARTMENT_OTHER)
Admission: EM | Admit: 2023-01-19 | Discharge: 2023-01-20 | Disposition: A | Discharge status: Home / Self Care | Payer: Medicaid Other | Attending: Emergency Medicine | Admitting: Emergency Medicine

## 2023-01-19 ENCOUNTER — Other Ambulatory Visit: Payer: Self-pay

## 2023-01-19 ENCOUNTER — Emergency Department (HOSPITAL_BASED_OUTPATIENT_CLINIC_OR_DEPARTMENT_OTHER): Payer: Medicaid Other

## 2023-01-19 DIAGNOSIS — Z1152 Encounter for screening for COVID-19: Secondary | ICD-10-CM | POA: Insufficient documentation

## 2023-01-19 DIAGNOSIS — K5090 Crohn's disease, unspecified, without complications: Secondary | ICD-10-CM

## 2023-01-19 DIAGNOSIS — R1084 Generalized abdominal pain: Secondary | ICD-10-CM

## 2023-01-19 DIAGNOSIS — K50919 Crohn's disease, unspecified, with unspecified complications: Secondary | ICD-10-CM | POA: Diagnosis not present

## 2023-01-19 LAB — RESP PANEL BY RT-PCR (RSV, FLU A&B, COVID)  RVPGX2
Influenza A by PCR: NEGATIVE
Influenza B by PCR: NEGATIVE
Resp Syncytial Virus by PCR: NEGATIVE
SARS Coronavirus 2 by RT PCR: NEGATIVE

## 2023-01-19 LAB — URINALYSIS, ROUTINE W REFLEX MICROSCOPIC
Glucose, UA: NEGATIVE mg/dL
Ketones, ur: 40 mg/dL — AB
Leukocytes,Ua: NEGATIVE
Nitrite: NEGATIVE
Protein, ur: 30 mg/dL — AB
Specific Gravity, Urine: 1.025 (ref 1.005–1.030)
pH: 6 (ref 5.0–8.0)

## 2023-01-19 LAB — CBC
HCT: 38 % (ref 36.0–46.0)
Hemoglobin: 11.7 g/dL — ABNORMAL LOW (ref 12.0–15.0)
MCH: 26.7 pg (ref 26.0–34.0)
MCHC: 30.8 g/dL (ref 30.0–36.0)
MCV: 86.6 fL (ref 80.0–100.0)
Platelets: 334 10*3/uL (ref 150–400)
RBC: 4.39 MIL/uL (ref 3.87–5.11)
RDW: 14.6 % (ref 11.5–15.5)
WBC: 6.6 10*3/uL (ref 4.0–10.5)
nRBC: 0 % (ref 0.0–0.2)

## 2023-01-19 LAB — COMPREHENSIVE METABOLIC PANEL
ALT: 7 U/L (ref 0–44)
AST: 10 U/L — ABNORMAL LOW (ref 15–41)
Albumin: 2.1 g/dL — ABNORMAL LOW (ref 3.5–5.0)
Alkaline Phosphatase: 64 U/L (ref 38–126)
Anion gap: 6 (ref 5–15)
BUN: 8 mg/dL (ref 6–20)
CO2: 22 mmol/L (ref 22–32)
Calcium: 7.6 mg/dL — ABNORMAL LOW (ref 8.9–10.3)
Chloride: 108 mmol/L (ref 98–111)
Creatinine, Ser: 0.63 mg/dL (ref 0.44–1.00)
GFR, Estimated: >60 mL/min (ref >60)
Glucose, Bld: 64 mg/dL — ABNORMAL LOW (ref 70–99)
Potassium: 2.9 mmol/L — ABNORMAL LOW (ref 3.5–5.1)
Sodium: 136 mmol/L (ref 135–145)
Total Bilirubin: 0.7 mg/dL (ref 0.3–1.2)
Total Protein: 6.3 g/dL — ABNORMAL LOW (ref 6.5–8.1)

## 2023-01-19 LAB — URINALYSIS, MICROSCOPIC (REFLEX)

## 2023-01-19 LAB — PREGNANCY, URINE: Preg Test, Ur: NEGATIVE

## 2023-01-19 LAB — LIPASE, BLOOD: Lipase: 17 U/L (ref 11–51)

## 2023-01-19 MED ORDER — PREDNISONE 50 MG PO TABS
60.0000 mg | ORAL_TABLET | Freq: Once | ORAL | Status: AC
  Administered 2023-01-19: 60 mg via ORAL
  Filled 2023-01-19: qty 1

## 2023-01-19 MED ORDER — ACETAMINOPHEN 500 MG PO TABS
1000.0000 mg | ORAL_TABLET | Freq: Once | ORAL | Status: AC
  Administered 2023-01-19: 1000 mg via ORAL
  Filled 2023-01-19: qty 2

## 2023-01-19 MED ORDER — PREDNISONE 10 MG PO TABS: 40.0000 mg | ORAL_TABLET | Freq: Every day | ORAL | 0 refills | Status: AC

## 2023-01-19 MED ORDER — MORPHINE SULFATE (PF) 2 MG/ML IV SOLN
1.0000 mg | Freq: Once | INTRAVENOUS | Status: AC
  Administered 2023-01-19: 1 mg via INTRAVENOUS
  Filled 2023-01-19: qty 1

## 2023-01-19 MED ORDER — ALUM & MAG HYDROXIDE-SIMETH 200-200-20 MG/5ML PO SUSP
30.0000 mL | Freq: Once | ORAL | Status: AC
  Administered 2023-01-19: 30 mL via ORAL
  Filled 2023-01-19: qty 30

## 2023-01-19 MED ORDER — IOHEXOL 300 MG/ML  SOLN
80.0000 mL | Freq: Once | INTRAMUSCULAR | Status: AC | PRN
  Administered 2023-01-19: 80 mL via INTRAVENOUS

## 2023-01-19 MED ORDER — SODIUM CHLORIDE 0.9 % IV BOLUS
1000.0000 mL | Freq: Once | INTRAVENOUS | Status: AC
  Administered 2023-01-19: 1000 mL via INTRAVENOUS

## 2023-01-19 MED ORDER — POTASSIUM CHLORIDE CRYS ER 20 MEQ PO TBCR
40.0000 meq | EXTENDED_RELEASE_TABLET | Freq: Once | ORAL | Status: AC
  Administered 2023-01-19: 40 meq via ORAL
  Filled 2023-01-19: qty 2

## 2023-01-19 MED ORDER — MORPHINE SULFATE (PF) 2 MG/ML IV SOLN
2.0000 mg | Freq: Once | INTRAVENOUS | Status: AC
  Administered 2023-01-19: 2 mg via INTRAVENOUS
  Filled 2023-01-19: qty 1

## 2023-01-19 MED ORDER — ONDANSETRON 4 MG PO TBDP: 4.0000 mg | ORAL_TABLET | Freq: Once | ORAL | Status: DC

## 2023-01-19 MED ORDER — ONDANSETRON HCL 4 MG/2ML IJ SOLN
4.0000 mg | Freq: Once | INTRAMUSCULAR | Status: AC
  Administered 2023-01-19: 4 mg via INTRAVENOUS
  Filled 2023-01-19: qty 2

## 2023-01-19 MED ORDER — PANTOPRAZOLE SODIUM 40 MG IV SOLR
40.0000 mg | Freq: Once | INTRAVENOUS | Status: AC
  Administered 2023-01-19: 40 mg via INTRAVENOUS
  Filled 2023-01-19: qty 10

## 2023-01-19 NOTE — ED Notes (Signed)
Light green lab tube resubmitted to lab, per lab request.

## 2023-01-19 NOTE — ED Notes (Signed)
Medications delayed due to difficult IV start.

## 2023-01-19 NOTE — Discharge Instructions (Addendum)
You were seen in the emergency department for abdominal pain. Given your CT scan results, this pain is consistent with a Crohn's exacerbation and is not due to other acute conditions in the abdomen requiring inpatient treatment. Please return to the ED if your symptoms worsen.

## 2023-01-19 NOTE — ED Notes (Signed)
Pt given crackers and gingerale.

## 2023-01-19 NOTE — ED Notes (Signed)
Per lab, pts light green lab draw hemolyzed for 3rd time.  EDP Rica Mote made aware.

## 2023-01-19 NOTE — ED Provider Notes (Signed)
Assumed care from this patient from [facility], PA-C.  At the time of handoff, plan was to await results of CMP to look for any electrolyte disturbances that may need to be corrected as well as wait for pending CT abdomen did discuss possible inpatient admission if concern for more malignant cause of abdominal pain versus outpatient management if more benign. Physical Exam  BP (!) 141/93   Pulse (!) 101   Temp 98.3 F (36.8 C) (Oral)   Resp 17   Ht '5\' 2"'$  (1.575 m)   Wt 60.3 kg   SpO2 98%   BMI 24.31 kg/m   Physical Exam Vitals and nursing note reviewed.    Procedures  Procedures  ED Course / MDM    Medical Decision Making Amount and/or Complexity of Data Reviewed Labs: ordered. Radiology: ordered.  Risk OTC drugs. Prescription drug management.   Patient was handed off to me by Fatima Sanger, PA-C.  Please see Tonny Bollman, PA-C's note for full HPI. At the time of handoff plan was to await results of CMP to assess for electrolyte imbalances and wait for pending CT abdomen before deciding disposition.  CMP resulted with only a mild hypokalemia with potassium of 2.9.  Potassium was replenished with oral potassium here in the ED.Marland KitchenCT abdomen resulted which showed acute inflammatory changes in the abdomen consistent with a Crohn's exacerbation, but no acute abdominal abscesses or evidence of any bowel obstruction.  Given no acute emergent finding requiring inpatient hospitalization, a dose of prednisone 60 mg was administered here in the emergency department to help manage an acute flare.  Will also prescribe a short prednisone burst of 4 days for patient to hopefully improve symptoms.  Encourage patient ensure that she can follow-up with her gastroenterologist in the next week or 2 if possible for further evaluation as she needs to be restarted on her Crohn's disease medications.  Patient was agreeable with this treatment plan verbalized understanding all return precautions.  All  questions answered prior to patient discharge.  Patient has previously seen [place] [facility] and reports that she has tried to schedule appointment to be seen by them but states that they are about 6 months out.  Encourage patient to contact them again for an earlier evaluation if possible.       Luvenia Heller, PA-C 01/19/23 2349    Audley Hose, MD 01/21/23 [phone]

## 2023-01-19 NOTE — ED Provider Notes (Signed)
[facility] Provider Note   CSN: [id number] Arrival date & time: 01/19/23  1540     History  Chief Complaint  Patient presents with   Abdominal Pain    [place] Bernard is a 27 y.o. female, history of Crohn's disease, who presents to the ED secondary to severe epigastric discomfort, with associated nausea, vomiting, diarrhea every time she eats or drinks.  She states she has lost 20 pounds in the last 3 weeks, has not been able to keep anything down.  States she has also not been on her Crohn's meds since giving birth in October.  Attempted to get into the GI doctor, and they told her just to come to the ER.  Denies any fevers, chills.  Cannot keep anything down.  Pain is stabbing along the epigastric and a 10 out of 10.     Home Medications Prior to Admission medications   Medication Sig Start Date End Date Taking? Authorizing Provider  acetaminophen (TYLENOL) 500 MG tablet Take 2 tablets (1,000 mg total) by mouth every 6 (six) hours as needed for moderate pain or mild pain. 09/08/22   Anyanwu, Sallyanne Havers, MD  ferrous sulfate 325 (65 FE) MG EC tablet Take 1 tablet (325 mg total) by mouth every other day. 06/15/22 12/12/22  Clarnce Flock, MD  ibuprofen (ADVIL) 600 MG tablet Take 1 tablet (600 mg total) by mouth every 6 (six) hours as needed for moderate pain or cramping. 09/08/22   Anyanwu, Sallyanne Havers, MD  NIFEdipine (ADALAT CC) 60 MG 24 hr tablet Take 1 tablet (60 mg total) by mouth daily. 10/26/22   Donnamae Jude, MD  Prenatal Vit-Fe Fumarate-FA [facility]) 27-0.8 MG TABS tablet Take 1 tablet by mouth daily 01/09/22   Jeanell Sparrow, DO      Allergies    Patient has no known allergies.    Review of Systems   Review of Systems  Constitutional:  Negative for fever.  Gastrointestinal:  Positive for abdominal pain, diarrhea, nausea and vomiting.    Physical Exam Updated Vital Signs BP (!) 140/95 (BP Location: Left Arm)   Pulse  98   Temp 97.8 F (36.6 C) (Oral)   Resp 16   Ht '5\' 2"'$  (1.575 m)   Wt 60.3 kg   SpO2 100%   BMI 24.31 kg/m  Physical Exam Vitals and nursing note reviewed.  Constitutional:      General: She is not in acute distress.    Appearance: She is well-developed.  HENT:     Head: Normocephalic and atraumatic.  Eyes:     Conjunctiva/sclera: Conjunctivae normal.  Cardiovascular:     Rate and Rhythm: Normal rate and regular rhythm.     Heart sounds: No murmur heard. Pulmonary:     Effort: Pulmonary effort is normal. No respiratory distress.     Breath sounds: Normal breath sounds.  Abdominal:     Palpations: Abdomen is soft.     Tenderness: There is generalized abdominal tenderness.  Musculoskeletal:        General: No swelling.     Cervical back: Neck supple.  Skin:    General: Skin is warm and dry.     Capillary Refill: Capillary refill takes less than 2 seconds.  Neurological:     Mental Status: She is alert.  Psychiatric:        Mood and Affect: Mood normal.     ED Results / Procedures / Treatments   Labs (  all labs ordered are listed, but only abnormal results are displayed) Labs Reviewed  CBC - Abnormal; Notable for the following components:      Result Value   Hemoglobin 11.7 (*)    All other components within normal limits  URINALYSIS, ROUTINE W REFLEX MICROSCOPIC - Abnormal; Notable for the following components:   Color, Urine AMBER (*)    APPearance CLOUDY (*)    Hgb urine dipstick LARGE (*)    Bilirubin Urine Arora Coakley (*)    Ketones, ur 40 (*)    Protein, ur 30 (*)    All other components within normal limits  URINALYSIS, MICROSCOPIC (REFLEX) - Abnormal; Notable for the following components:   Bacteria, UA FEW (*)    All other components within normal limits  PREGNANCY, URINE  COMPREHENSIVE METABOLIC PANEL  LIPASE, BLOOD    EKG None  Radiology No results found.  Procedures Procedures    Medications Ordered in ED Medications  sodium chloride 0.9 %  bolus 1,000 mL (has no administration in time range)  alum & mag hydroxide-simeth (MAALOX/MYLANTA) 200-200-20 MG/5ML suspension 30 mL (has no administration in time range)  ondansetron (ZOFRAN) injection 4 mg (4 mg Intravenous Given 01/19/23 1737)  morphine (PF) 2 MG/ML injection 2 mg (2 mg Intravenous Given 01/19/23 1738)  pantoprazole (PROTONIX) injection 40 mg (40 mg Intravenous Given 01/19/23 1736)  sodium chloride 0.9 % bolus 1,000 mL (1,000 mLs Intravenous New Bag/Given 01/19/23 1816)    ED Course/ Medical Decision Making/ A&P                            Medical Decision Making Patient is a 27 year old female, here for generalized abdominal pain, with nausea and vomiting, weight loss has been occurring over the last 2 weeks.  She has not been able to get into her GI doctor to start her Crohn's medications again, and so she does not know if she is in a flare.  She is generalized abdominal tenderness without guarding, given her history of Crohn's, inability to keep liquids down, we will start her on IV fluids, obtain CT abdomen pelvis, CBC, CMP, lipase for further evaluation.  She denies any history of alcoholism.  Amount and/or Complexity of Data Reviewed Labs: ordered.    Details: Urine appears to show dehydration, cloudy, blood, and bilirubin along with ketones in urine.  Pending CMP and lipase Radiology: ordered.    Details: Pending Discussion of management or test interpretation with external provider(s): Discussed with PA Ariel, he will follow-up on patient's CT abdomen pelvis, labs, and reassess patient and appropriately disposition.  Risk OTC drugs. Prescription drug management.    Final Clinical Impression(s) / ED Diagnoses Final diagnoses:  Generalized abdominal pain    Rx / DC Orders ED Discharge Orders     None         Syleena Mchan, Si Gaul, PA 01/19/23 1857    Audley Hose, MD 01/21/23 1220

## 2023-01-19 NOTE — ED Triage Notes (Signed)
Pt c/o abdominal pain nausea & moviting that started 3 weeks ago.

## 2023-01-22 ENCOUNTER — Telehealth: Payer: Self-pay | Admitting: Physician Assistant

## 2023-01-22 NOTE — Telephone Encounter (Signed)
Attempted to contact the patient. No answer. After multiple rings, the call goes to automated voice saying "your call cannot be completed as dialed."   There are no openings on any schedule for several weeks. The patient did not schedule an appointment. She is past due follow up. She was a no show to the last 2 appointments she was scheduled for with Dr Bryan Lemma. The patient is presently on a short burst of prednisone.

## 2023-01-22 NOTE — Telephone Encounter (Signed)
Inbound call from patient, states she had a visit at the hospital and is experiencing abdominal pain.  Patient is requesting an appointment within the next week with a PA or Dr. Bryan Lemma. Please advise.

## 2023-01-24 NOTE — Telephone Encounter (Signed)
Called the patient's mobile phone [phone] and spoke with the patient. Scheduled her for a follow up appointment 03/02/23 at 3:00 pm. Asked the patient to call us if she cannot keep that appointment. Offered to mail her an appointment card. Patient declines. She uses "My Chart."

## 2023-03-02 ENCOUNTER — Ambulatory Visit: Payer: Medicaid Other | Admitting: Physician Assistant

## 2023-03-05 ENCOUNTER — Encounter: Payer: Self-pay | Admitting: *Deleted

## 2023-04-26 ENCOUNTER — Ambulatory Visit: Payer: Medicaid Other | Admitting: Gastroenterology

## 2024-02-13 ENCOUNTER — Encounter (HOSPITAL_BASED_OUTPATIENT_CLINIC_OR_DEPARTMENT_OTHER): Payer: Self-pay | Admitting: Emergency Medicine

## 2024-02-13 ENCOUNTER — Emergency Department (HOSPITAL_BASED_OUTPATIENT_CLINIC_OR_DEPARTMENT_OTHER)
Admission: EM | Admit: 2024-02-13 | Discharge: 2024-02-13 | Disposition: A | Discharge status: Home / Self Care | Payer: Self-pay | Attending: Emergency Medicine | Admitting: Emergency Medicine

## 2024-02-13 ENCOUNTER — Other Ambulatory Visit: Payer: Self-pay

## 2024-02-13 DIAGNOSIS — M545 Low back pain, unspecified: Secondary | ICD-10-CM | POA: Insufficient documentation

## 2024-02-13 MED ORDER — KETOROLAC TROMETHAMINE 30 MG/ML IJ SOLN
30.0000 mg | Freq: Once | INTRAMUSCULAR | Status: AC
  Administered 2024-02-13: 30 mg via INTRAMUSCULAR
  Filled 2024-02-13: qty 1

## 2024-02-13 MED ORDER — METHOCARBAMOL 500 MG PO TABS: 500.0000 mg | ORAL_TABLET | Freq: Two times a day (BID) | ORAL | 0 refills | Status: DC

## 2024-02-13 NOTE — Discharge Instructions (Signed)
 You were evaluated in the emergency room for low back pain.  Your exam is consistent with musculoskeletal etiology.  Prescription for Robaxin was sent into your pharmacy.  As discussed please use Tylenol 1000 mg every 4-6 hours up to 3 times a day and/or Motrin 600 to 800 mg every 4-6 hours up to 3 times a day on a full stomach.  Please alternate these if your symptoms persist please pick up the muscle relaxer.  Please avoid driving or operating heavy machinery while using the muscle relaxer.  If your symptoms persist please follow-up with your primary care doctor.

## 2024-02-13 NOTE — ED Provider Notes (Signed)
 [place] [facility] Provider Note   CSN: [id number] Arrival date & time: 02/13/24  1801     History  Chief Complaint  Patient presents with   Back Pain    Jennifer Cain is a 28 y.o. female with history of Crohn's presents with atraumatic right lower back pain that started a few hours ago.  States she took 2000 mg of Tylenol which she did not improve her symptoms.  Denies any numbness or tingling.  No urinary symptoms or incontinence.  Never had this pain before.   Back Pain     Past Medical History:  Diagnosis Date   Crohn disease (HCC)      Home Medications Prior to Admission medications   Medication Sig Start Date End Date Taking? Authorizing Provider  methocarbamol (ROBAXIN) 500 MG tablet Take 1 tablet (500 mg total) by mouth 2 (two) times daily. 02/13/24  Yes Halford Decamp, PA-C  acetaminophen (TYLENOL) 500 MG tablet Take 2 tablets (1,000 mg total) by mouth every 6 (six) hours as needed for moderate pain or mild pain. 09/08/22   Anyanwu, Jethro Bastos, MD  ferrous sulfate 325 (65 FE) MG EC tablet Take 1 tablet (325 mg total) by mouth every other day. 06/15/22 12/12/22  Venora Maples, MD  ibuprofen (ADVIL) 600 MG tablet Take 1 tablet (600 mg total) by mouth every 6 (six) hours as needed for moderate pain or cramping. 09/08/22   Anyanwu, Jethro Bastos, MD  NIFEdipine (ADALAT CC) 60 MG 24 hr tablet Take 1 tablet (60 mg total) by mouth daily. 10/26/22   Reva Bores, MD  Prenatal Vit-Fe Fumarate-FA [facility]) 27-0.8 MG TABS tablet Take 1 tablet by mouth daily 01/09/22   Sloan Leiter, DO      Allergies    Patient has no known allergies.    Review of Systems   Review of Systems  Musculoskeletal:  Positive for back pain.    Physical Exam Updated Vital Signs BP (!) 144/97 (BP Location: Right Arm)   Pulse 87   Temp 98.3 F (36.8 C) (Oral)   Resp 16   Ht 5\' 4"  (1.626 m)   Wt 71.4 kg   LMP 02/11/2024 (Exact Date)    SpO2 98%   Breastfeeding No   BMI 27.03 kg/m  Physical Exam Vitals and nursing note reviewed.  Constitutional:      General: She is not in acute distress.    Appearance: She is well-developed.  HENT:     Head: Normocephalic and atraumatic.  Eyes:     Conjunctiva/sclera: Conjunctivae normal.  Cardiovascular:     Rate and Rhythm: Normal rate and regular rhythm.     Heart sounds: No murmur heard. Pulmonary:     Effort: Pulmonary effort is normal. No respiratory distress.     Breath sounds: Normal breath sounds.  Abdominal:     Palpations: Abdomen is soft.     Tenderness: There is no abdominal tenderness.  Musculoskeletal:        General: No swelling.     Cervical back: Neck supple.     Comments: Mild right lower paraspinal lumbar tenderness without gross deformities, no midline tenderness  Skin:    General: Skin is warm and dry.     Capillary Refill: Capillary refill takes less than 2 seconds.  Neurological:     Mental Status: She is alert.     Comments: Patient is alert and oriented. There is no abnormal phonation. Symmetric smile without  facial droop.  Moves all extremities spontaneously. 5/5 strength in  lower extremities. . No sensation deficit. normal ambulation.    Psychiatric:        Mood and Affect: Mood normal.     ED Results / Procedures / Treatments   Labs (all labs ordered are listed, but only abnormal results are displayed) Labs Reviewed - No data to display  EKG None  Radiology No results found.  Procedures Procedures    Medications Ordered in ED Medications  ketorolac (TORADOL) 30 MG/ML injection 30 mg (has no administration in time range)    ED Course/ Medical Decision Making/ A&P                                 Medical Decision Making  This patient presents to the ED with chief complaint(s) of back pain.  The complaint involves an extensive differential diagnosis and also carries with it a high risk of complications and morbidity.    pertinent past medical history as listed in HPI  The differential diagnosis includes  MSK, underlying fracture, epidural hematoma/abscess, cauda equina syndrome, spinal stenosis, discitis   Additional history obtained:  Records reviewed Care Everywhere/External Records  Initial Assessment:   Hemodynamically stable patient presenting with back pain.  On exam patient was tender to R lumbar paraspinal region. No neurological deficits and normal neuro exam.  Patient can ambulate without difficulty.  No loss of bowel or bladder control.  No concern for cauda equina.  No fever, night sweats, weight loss, h/o cancer, IVDU.  Overall most consistent with musculoskeletal etiology.  Independent ECG interpretation:  none  Independent labs interpretation:  The following labs were independently interpreted:  none  Independent visualization and interpretation of imaging: none   Treatment and Reassessment: Patient given shot of Toradol following first assessment  Consultations obtained:   none  Disposition:   Patient will be discharged home.  Short prescription of Robaxin sent to pharmacy.  Encouraged to alternate with Tylenol and ibuprofen instead of taking such large doses of Tylenol at a time.  The patient has been appropriately medically screened and/or stabilized in the ED. I have low suspicion for any other emergent medical condition which would require further screening, evaluation or treatment in the ED or require inpatient management. At time of discharge the patient is hemodynamically stable and in no acute distress. I have discussed work-up results and diagnosis with patient and answered all questions. Patient is agreeable with discharge plan. We discussed strict return precautions for returning to the emergency department and they verbalized understanding.     Social Determinants of Health:   none  This note was dictated with voice recognition software.  Despite best efforts at  proofreading, errors may have occurred which can change the documentation meaning.          Final Clinical Impression(s) / ED Diagnoses Final diagnoses:  Acute right-sided low back pain without sciatica    Rx / DC Orders ED Discharge Orders          Ordered    methocarbamol (ROBAXIN) 500 MG tablet  2 times daily        02/13/24 2013              Fabienne Bruns 02/13/24 2014    Maia Plan, MD 02/18/24 [phone]

## 2024-02-13 NOTE — ED Triage Notes (Signed)
 Pt c/o lower back pain since around 2pm. States she took 2000mg  tylenol PTA with no relief. Ambulatory w/o difficulty, NAD in triage. No known injury.

## 2024-03-21 ENCOUNTER — Inpatient Hospital Stay (HOSPITAL_BASED_OUTPATIENT_CLINIC_OR_DEPARTMENT_OTHER)
Admission: EM | Admit: 2024-03-21 | Discharge: 2024-03-25 | DRG: 386 | Disposition: A | Discharge status: Home / Self Care | Payer: Self-pay | Attending: Student | Admitting: Student

## 2024-03-21 ENCOUNTER — Encounter (HOSPITAL_BASED_OUTPATIENT_CLINIC_OR_DEPARTMENT_OTHER): Payer: Self-pay | Admitting: Emergency Medicine

## 2024-03-21 ENCOUNTER — Other Ambulatory Visit: Payer: Self-pay

## 2024-03-21 DIAGNOSIS — K50819 Crohn's disease of both small and large intestine with unspecified complications: Principal | ICD-10-CM | POA: Diagnosis present

## 2024-03-21 DIAGNOSIS — R030 Elevated blood-pressure reading, without diagnosis of hypertension: Secondary | ICD-10-CM | POA: Diagnosis present

## 2024-03-21 DIAGNOSIS — D649 Anemia, unspecified: Secondary | ICD-10-CM | POA: Diagnosis present

## 2024-03-21 DIAGNOSIS — E860 Dehydration: Secondary | ICD-10-CM | POA: Diagnosis present

## 2024-03-21 DIAGNOSIS — E872 Acidosis, unspecified: Secondary | ICD-10-CM | POA: Diagnosis present

## 2024-03-21 DIAGNOSIS — E876 Hypokalemia: Secondary | ICD-10-CM | POA: Diagnosis present

## 2024-03-21 DIAGNOSIS — R1310 Dysphagia, unspecified: Secondary | ICD-10-CM | POA: Diagnosis present

## 2024-03-21 DIAGNOSIS — Z5971 Insufficient health insurance coverage: Secondary | ICD-10-CM

## 2024-03-21 DIAGNOSIS — E8809 Other disorders of plasma-protein metabolism, not elsewhere classified: Secondary | ICD-10-CM | POA: Diagnosis present

## 2024-03-21 DIAGNOSIS — Z8249 Family history of ischemic heart disease and other diseases of the circulatory system: Secondary | ICD-10-CM

## 2024-03-21 DIAGNOSIS — R1013 Epigastric pain: Secondary | ICD-10-CM

## 2024-03-21 DIAGNOSIS — R1319 Other dysphagia: Secondary | ICD-10-CM

## 2024-03-21 DIAGNOSIS — K50119 Crohn's disease of large intestine with unspecified complications: Principal | ICD-10-CM

## 2024-03-21 DIAGNOSIS — K2970 Gastritis, unspecified, without bleeding: Secondary | ICD-10-CM | POA: Diagnosis present

## 2024-03-21 DIAGNOSIS — K5090 Crohn's disease, unspecified, without complications: Secondary | ICD-10-CM | POA: Diagnosis present

## 2024-03-21 LAB — URINALYSIS, MICROSCOPIC (REFLEX)

## 2024-03-21 LAB — URINALYSIS, ROUTINE W REFLEX MICROSCOPIC
Glucose, UA: NEGATIVE mg/dL
Ketones, ur: >=80 mg/dL — AB
Leukocytes,Ua: NEGATIVE
Nitrite: NEGATIVE
Protein, ur: 100 mg/dL — AB
Specific Gravity, Urine: 1.025 (ref 1.005–1.030)
pH: 6.5 (ref 5.0–8.0)

## 2024-03-21 LAB — LIPASE, BLOOD: Lipase: <10 U/L — ABNORMAL LOW (ref 11–51)

## 2024-03-21 LAB — CBC
HCT: 35 % — ABNORMAL LOW (ref 36.0–46.0)
Hemoglobin: 11.7 g/dL — ABNORMAL LOW (ref 12.0–15.0)
MCH: 28.7 pg (ref 26.0–34.0)
MCHC: 33.4 g/dL (ref 30.0–36.0)
MCV: 86 fL (ref 80.0–100.0)
Platelets: 360 10*3/uL (ref 150–400)
RBC: 4.07 MIL/uL (ref 3.87–5.11)
RDW: 13.8 % (ref 11.5–15.5)
WBC: 8.7 10*3/uL (ref 4.0–10.5)
nRBC: 0 % (ref 0.0–0.2)

## 2024-03-21 LAB — PREGNANCY, URINE: Preg Test, Ur: NEGATIVE

## 2024-03-21 LAB — LACTIC ACID, PLASMA: Lactic Acid, Venous: 1.4 mmol/L (ref 0.5–1.9)

## 2024-03-21 MED ORDER — SODIUM CHLORIDE 0.9 % IV BOLUS
1000.0000 mL | Freq: Once | INTRAVENOUS | Status: AC
  Administered 2024-03-21: 1000 mL via INTRAVENOUS

## 2024-03-21 MED ORDER — ONDANSETRON HCL 4 MG/2ML IJ SOLN
4.0000 mg | Freq: Once | INTRAMUSCULAR | Status: AC
  Administered 2024-03-21: 4 mg via INTRAVENOUS
  Filled 2024-03-21: qty 2

## 2024-03-21 MED ORDER — FENTANYL CITRATE PF 50 MCG/ML IJ SOSY
50.0000 ug | PREFILLED_SYRINGE | Freq: Once | INTRAMUSCULAR | Status: AC
  Administered 2024-03-21: 50 ug via INTRAVENOUS
  Filled 2024-03-21: qty 1

## 2024-03-21 NOTE — ED Triage Notes (Signed)
 Pt POV c/o abd pain, periumbilical pain x 1 month.  Green emesis. Poor po intake.  Intermittent diarrhea.   Denies fever.

## 2024-03-21 NOTE — ED Provider Notes (Signed)
 [place] [facility] Provider Note   CSN: [id number] Arrival date & time: 03/21/24  2225     History {Add pertinent medical, surgical, social history, OB history to HPI:1} Chief Complaint  Patient presents with   Abdominal Pain    Jennifer Cain is a 28 y.o. female.  The history is provided by the patient.  Abdominal Pain Jennifer Cain is a 28 y.o. female who presents to the Emergency Department complaining of abdominal pain.  She presents to the emergency department for evaluation of 1 month of central abdominal pain.  She reports being unable to keep anything down and is vomiting anytime she eats or drinks.  She describes it as a large amount of emesis that is green in color.  She also reports twice daily diarrhea.  No associated fever, dysuria.  She does have a history of Crohn's but is not currently on any medications and does not currently have a gastroenterologist due to losing her health insurance.  Her last flareup was about 1 year ago when she delivered her baby.  She does not currently take any medications.  No prior abdominal surgeries.  She previously saw Dr. Verneda Golder.     Home Medications Prior to Admission medications   Medication Sig Start Date End Date Taking? Authorizing Provider  acetaminophen  (TYLENOL ) 500 MG tablet Take 2 tablets (1,000 mg total) by mouth every 6 (six) hours as needed for moderate pain or mild pain. 09/08/22   Anyanwu, Ugonna A, MD  ferrous sulfate  325 (65 FE) MG EC tablet Take 1 tablet (325 mg total) by mouth every other day. 06/15/22 12/12/22  Teena Feast, MD  ibuprofen  (ADVIL ) 600 MG tablet Take 1 tablet (600 mg total) by mouth every 6 (six) hours as needed for moderate pain or cramping. 09/08/22   Anyanwu, Ugonna A, MD  methocarbamol  (ROBAXIN ) 500 MG tablet Take 1 tablet (500 mg total) by mouth 2 (two) times daily. 02/13/24   Felicie Horning, PA-C  NIFEdipine  (ADALAT  CC) 60 MG 24 hr tablet Take 1 tablet  (60 mg total) by mouth daily. 10/26/22   Granville Layer, MD  Prenatal Vit-Fe Fumarate-FA [facility]) 27-0.8 MG TABS tablet Take 1 tablet by mouth daily 01/09/22   Teddi Favors, DO      Allergies    Patient has no known allergies.    Review of Systems   Review of Systems  Gastrointestinal:  Positive for abdominal pain.  All other systems reviewed and are negative.   Physical Exam Updated Vital Signs BP (!) 128/93   Pulse (!) 111   Temp 99.6 F (37.6 C) (Oral)   Resp (!) 23   Ht 5\' 4"  (1.626 m)   Wt 68 kg   SpO2 100%   BMI 25.75 kg/m  Physical Exam Vitals and nursing note reviewed.  Constitutional:      Appearance: She is well-developed.  HENT:     Head: Normocephalic and atraumatic.  Cardiovascular:     Rate and Rhythm: Regular rhythm. Tachycardia present.     Heart sounds: No murmur heard. Pulmonary:     Effort: Pulmonary effort is normal. No respiratory distress.     Breath sounds: Normal breath sounds.  Abdominal:     Palpations: Abdomen is soft.     Tenderness: There is no guarding or rebound.     Comments: Mild to moderate generalized abdominal tenderness  Musculoskeletal:        General: No tenderness.  Skin:  General: Skin is warm and dry.  Neurological:     Mental Status: She is alert and oriented to person, place, and time.  Psychiatric:        Behavior: Behavior normal.     ED Results / Procedures / Treatments   Labs (all labs ordered are listed, but only abnormal results are displayed) Labs Reviewed  CBC - Abnormal; Notable for the following components:      Result Value   Hemoglobin 11.7 (*)    HCT 35.0 (*)    All other components within normal limits  LACTIC ACID, PLASMA  LIPASE, BLOOD  COMPREHENSIVE METABOLIC PANEL WITH GFR  URINALYSIS, ROUTINE W REFLEX MICROSCOPIC  PREGNANCY, URINE  LACTIC ACID, PLASMA    EKG None  Radiology No results found.  Procedures Procedures  {Document cardiac monitor, telemetry assessment  procedure when appropriate:1}  Medications Ordered in ED Medications - No data to display  ED Course/ Medical Decision Making/ A&P   {   Click here for ABCD2, HEART and other calculatorsREFRESH Note before signing :1}                              Medical Decision Making Amount and/or Complexity of Data Reviewed Labs: ordered. Radiology: ordered.  Risk Prescription drug management.   ***  {Document critical care time when appropriate:1} {Document review of labs and clinical decision tools ie heart score, Chads2Vasc2 etc:1}  {Document your independent review of radiology images, and any outside records:1} {Document your discussion with family members, caretakers, and with consultants:1} {Document social determinants of health affecting pt's care:1} {Document your decision making why or why not admission, treatments were needed:1} Final Clinical Impression(s) / ED Diagnoses Final diagnoses:  None    Rx / DC Orders ED Discharge Orders     None

## 2024-03-22 ENCOUNTER — Emergency Department (HOSPITAL_BASED_OUTPATIENT_CLINIC_OR_DEPARTMENT_OTHER): Payer: Self-pay

## 2024-03-22 DIAGNOSIS — K5000 Crohn's disease of small intestine without complications: Secondary | ICD-10-CM

## 2024-03-22 DIAGNOSIS — K5090 Crohn's disease, unspecified, without complications: Secondary | ICD-10-CM | POA: Diagnosis present

## 2024-03-22 LAB — COMPREHENSIVE METABOLIC PANEL WITH GFR
ALT: <5 U/L (ref 0–44)
ALT: 10 U/L (ref 0–44)
AST: 14 U/L — ABNORMAL LOW (ref 15–41)
AST: 11 U/L — ABNORMAL LOW (ref 15–41)
Albumin: 3.3 g/dL — ABNORMAL LOW (ref 3.5–5.0)
Albumin: 2.4 g/dL — ABNORMAL LOW (ref 3.5–5.0)
Alkaline Phosphatase: 78 U/L (ref 38–126)
Alkaline Phosphatase: 54 U/L (ref 38–126)
Anion gap: 21 — ABNORMAL HIGH (ref 5–15)
Anion gap: 11 (ref 5–15)
BUN: 11 mg/dL (ref 6–20)
BUN: 7 mg/dL (ref 6–20)
CO2: 17 mmol/L — ABNORMAL LOW (ref 22–32)
CO2: 24 mmol/L (ref 22–32)
Calcium: 9.1 mg/dL (ref 8.9–10.3)
Calcium: 8.2 mg/dL — ABNORMAL LOW (ref 8.9–10.3)
Chloride: 101 mmol/L (ref 98–111)
Chloride: 103 mmol/L (ref 98–111)
Creatinine, Ser: 0.78 mg/dL (ref 0.44–1.00)
Creatinine, Ser: 0.72 mg/dL (ref 0.44–1.00)
GFR, Estimated: >60 mL/min (ref >60)
GFR, Estimated: >60 mL/min (ref >60)
Glucose, Bld: 77 mg/dL (ref 70–99)
Glucose, Bld: 96 mg/dL (ref 70–99)
Potassium: 3.1 mmol/L — ABNORMAL LOW (ref 3.5–5.1)
Potassium: 3.6 mmol/L (ref 3.5–5.1)
Sodium: 139 mmol/L (ref 135–145)
Sodium: 138 mmol/L (ref 135–145)
Total Bilirubin: 0.5 mg/dL (ref 0.0–1.2)
Total Bilirubin: 0.6 mg/dL (ref 0.0–1.2)
Total Protein: 8 g/dL (ref 6.5–8.1)
Total Protein: 6.8 g/dL (ref 6.5–8.1)

## 2024-03-22 LAB — C-REACTIVE PROTEIN: CRP: 12.3 mg/dL — ABNORMAL HIGH (ref <1.0)

## 2024-03-22 LAB — CBC
HCT: 34.6 % — ABNORMAL LOW (ref 36.0–46.0)
Hemoglobin: 11.2 g/dL — ABNORMAL LOW (ref 12.0–15.0)
MCH: 28.8 pg (ref 26.0–34.0)
MCHC: 32.4 g/dL (ref 30.0–36.0)
MCV: 88.9 fL (ref 80.0–100.0)
Platelets: 306 10*3/uL (ref 150–400)
RBC: 3.89 MIL/uL (ref 3.87–5.11)
RDW: 14.2 % (ref 11.5–15.5)
WBC: 5.2 10*3/uL (ref 4.0–10.5)
nRBC: 0 % (ref 0.0–0.2)

## 2024-03-22 LAB — LACTIC ACID, PLASMA: Lactic Acid, Venous: 0.7 mmol/L (ref 0.5–1.9)

## 2024-03-22 LAB — MAGNESIUM: Magnesium: 1.6 mg/dL — ABNORMAL LOW (ref 1.7–2.4)

## 2024-03-22 LAB — SEDIMENTATION RATE: Sed Rate: 62 mm/h — ABNORMAL HIGH (ref 0–22)

## 2024-03-22 LAB — PHOSPHORUS: Phosphorus: 3.1 mg/dL (ref 2.5–4.6)

## 2024-03-22 MED ORDER — ACETAMINOPHEN 500 MG PO TABS
1000.0000 mg | ORAL_TABLET | Freq: Four times a day (QID) | ORAL | Status: DC
  Administered 2024-03-22 – 2024-03-24 (×6): 1000 mg via ORAL
  Filled 2024-03-22 (×11): qty 2

## 2024-03-22 MED ORDER — IOHEXOL 300 MG/ML  SOLN
100.0000 mL | Freq: Once | INTRAMUSCULAR | Status: AC | PRN
  Administered 2024-03-22: 100 mL via INTRAVENOUS

## 2024-03-22 MED ORDER — ACETAMINOPHEN 325 MG PO TABS: 650.0000 mg | ORAL_TABLET | Freq: Four times a day (QID) | ORAL | Status: DC | PRN

## 2024-03-22 MED ORDER — TRAMADOL HCL 50 MG PO TABS
50.0000 mg | ORAL_TABLET | Freq: Three times a day (TID) | ORAL | Status: DC | PRN
  Administered 2024-03-24: 50 mg via ORAL
  Filled 2024-03-22: qty 1

## 2024-03-22 MED ORDER — POTASSIUM CHLORIDE 2 MEQ/ML IV SOLN
INTRAVENOUS | Status: AC
  Filled 2024-03-22 (×2): qty 1000

## 2024-03-22 MED ORDER — METHYLPREDNISOLONE SODIUM SUCC 40 MG IJ SOLR
20.0000 mg | Freq: Two times a day (BID) | INTRAMUSCULAR | Status: DC
  Administered 2024-03-22 – 2024-03-25 (×7): 20 mg via INTRAVENOUS
  Filled 2024-03-22 (×7): qty 1

## 2024-03-22 MED ORDER — OXYCODONE HCL 5 MG PO TABS
5.0000 mg | ORAL_TABLET | Freq: Three times a day (TID) | ORAL | Status: DC | PRN
  Administered 2024-03-22: 5 mg via ORAL
  Filled 2024-03-22: qty 1

## 2024-03-22 MED ORDER — HYDROMORPHONE HCL 1 MG/ML IJ SOLN
0.5000 mg | INTRAMUSCULAR | Status: DC | PRN
  Administered 2024-03-22 (×2): 0.5 mg via INTRAVENOUS
  Filled 2024-03-22 (×2): qty 1

## 2024-03-22 MED ORDER — MAGNESIUM SULFATE 2 GM/50ML IV SOLN
2.0000 g | Freq: Once | INTRAVENOUS | Status: AC
  Administered 2024-03-22: 2 g via INTRAVENOUS
  Filled 2024-03-22: qty 50

## 2024-03-22 MED ORDER — ENOXAPARIN SODIUM 40 MG/0.4ML IJ SOSY
40.0000 mg | PREFILLED_SYRINGE | INTRAMUSCULAR | Status: DC
  Administered 2024-03-22 – 2024-03-24 (×3): 40 mg via SUBCUTANEOUS
  Filled 2024-03-22 (×3): qty 0.4

## 2024-03-22 MED ORDER — ACETAMINOPHEN 650 MG RE SUPP: 650.0000 mg | Freq: Four times a day (QID) | RECTAL | Status: DC | PRN

## 2024-03-22 MED ORDER — MORPHINE SULFATE (PF) 4 MG/ML IV SOLN
4.0000 mg | Freq: Once | INTRAVENOUS | Status: AC
  Administered 2024-03-22: 4 mg via INTRAVENOUS
  Filled 2024-03-22: qty 1

## 2024-03-22 MED ORDER — ONDANSETRON HCL 4 MG PO TABS
4.0000 mg | ORAL_TABLET | Freq: Four times a day (QID) | ORAL | Status: DC | PRN
  Administered 2024-03-23 – 2024-03-25 (×3): 4 mg via ORAL
  Filled 2024-03-22 (×3): qty 1

## 2024-03-22 MED ORDER — ONDANSETRON HCL 4 MG/2ML IJ SOLN: 4.0000 mg | Freq: Four times a day (QID) | INTRAMUSCULAR | Status: DC | PRN

## 2024-03-22 MED ORDER — POTASSIUM CHLORIDE 10 MEQ/100ML IV SOLN
10.0000 meq | INTRAVENOUS | Status: AC
  Administered 2024-03-22: 10 meq via INTRAVENOUS
  Filled 2024-03-22: qty 100

## 2024-03-22 MED ORDER — METHYLPREDNISOLONE SODIUM SUCC 125 MG IJ SOLR
125.0000 mg | Freq: Once | INTRAMUSCULAR | Status: AC
  Administered 2024-03-22: 125 mg via INTRAVENOUS
  Filled 2024-03-22: qty 2

## 2024-03-22 MED ORDER — SODIUM CHLORIDE 0.9 % IV SOLN: INTRAVENOUS | Status: AC

## 2024-03-22 NOTE — ED Notes (Signed)
 Sprite given

## 2024-03-22 NOTE — ED Notes (Addendum)
 Report given to floor

## 2024-03-22 NOTE — Plan of Care (Signed)
   Problem: Coping: Goal: Level of anxiety will decrease Outcome: Progressing   Problem: Safety: Goal: Ability to remain free from injury will improve Outcome: Progressing   Problem: Skin Integrity: Goal: Risk for impaired skin integrity will decrease Outcome: Progressing

## 2024-03-22 NOTE — Plan of Care (Signed)
 Plan of Care Note for accepted transfer   Patient: Jennifer Cain MRN: [id number]   DOA: 03/21/2024  Facility requesting transfer: [facility] Requesting Provider: Dr. Monique Ano Reason for transfer: abd pain Facility course:  28 yo female with PMH Crohn's disease not on treatment presenting with abd pain and green stools.  CT shows increased thickening in ileum.  WBC 8.7, lactic normal.  Give dose of solumedrol. Will need GI consult on admission.   Plan of care: The patient is accepted for admission to Med-surg  unit, at [facility]  Author: Faith Homes, MD 03/22/2024  Check [email or web address] for on-call coverage.  Nursing staff, Please call [facility] Admits & Consults System-Wide number on Amion as soon as patient's arrival, so appropriate admitting provider can evaluate the pt.

## 2024-03-22 NOTE — H&P (Signed)
 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  Patient reports periumbilical abdominal pain for about a month.  Denies progression or significant change over the last 1 months.  Describes the pain as "contraction".  Pain is always there but it is off at times.  Rates her pain 10/10 although she does not appear to be in that mild distress.  No provoking or elevating factor.  She has been trying to treated with Tylenol  at home.  She developed recurrent bilious emesis and was not able to keep anything down that prompted her to come to ED.  She reports having watery.  Denies blood in emesis or stool.  Denies UTI symptoms.  Last seen by her gastroenterologist about 3 years ago before she was pregnant with her son.  She denies fever, runny nose, sore throat, chest pain, shortness of breath or cough.  Patient denies smoking cigarettes, drinking alcohol or recreational drug use.  She is interested in cardiopulmonary resuscitation in event of sudden cardiopulmonary arrest.  In ED, slight tachycardia and tachypnea on arrival but resolved quickly.  K3.1.  CO2 17.  AG 21.  Lactic acid 1.4.  Hgb 11.7.  LFT within normal.  Urine pregnancy test negative.  UA with ketonuria.  CT abdomen and pelvis raise concern for ileitis for which admission was requested.  ROS All review of system negative except for pertinent positives and negatives as history of present illness above.  PMH Past Medical History:  Diagnosis Date   Crohn disease (HCC)    PSH Past Surgical History:  Procedure Laterality Date   BIOPSY  07/20/2018   Procedure: BIOPSY;  Surgeon: Alvis Jourdain, MD;  Location: [facility];  Service: Endoscopy;;   BIOPSY  05/04/2021   Procedure: BIOPSY;  Surgeon: Lindle Rhea, MD;  Location: [facility];  Service: Gastroenterology;;   CESAREAN SECTION N/A 09/06/2022   Procedure: CESAREAN SECTION;  Surgeon: Janeane Mealy, MD;  Location: [facility];  Service: Obstetrics;  Laterality: N/A;   COLONOSCOPY WITH PROPOFOL  N/A 07/20/2018   Procedure: COLONOSCOPY WITH PROPOFOL ;  Surgeon: Alvis Jourdain, MD;  Location: [facility];  Service: Endoscopy;  Laterality: N/A;   COLONOSCOPY WITH PROPOFOL  N/A 05/04/2021   Procedure: COLONOSCOPY WITH PROPOFOL ;  Surgeon: Lindle Rhea, MD;  Location: [facility];  Service: Gastroenterology;  Laterality: N/A;   ESOPHAGOGASTRODUODENOSCOPY (EGD) WITH PROPOFOL  N/A 07/20/2018   Procedure: ESOPHAGOGASTRODUODENOSCOPY (EGD) WITH PROPOFOL ;  Surgeon: Alvis Jourdain, MD;  Location: [facility];  Service: Endoscopy;  Laterality: N/A;   ESOPHAGOGASTRODUODENOSCOPY (EGD) WITH PROPOFOL  N/A 05/04/2021   Procedure: ESOPHAGOGASTRODUODENOSCOPY (EGD) WITH PROPOFOL ;  Surgeon: Lindle Rhea, MD;  Location: [facility];  Service: Gastroenterology;  Laterality: N/A;   Fam HX Family History  Problem Relation Age of Onset   Stroke Mother    Heart disease Father    GI Disease Neg Hx    Autoimmune disease Neg Hx    Asthma Neg Hx    Cancer Neg Hx    Diabetes Neg Hx     Social Hx  reports that she has never smoked. She has never been exposed to tobacco smoke. She has never used smokeless tobacco. She reports that she does not drink alcohol and does not use drugs.  Allergy No Known Allergies Home Meds Prior to Admission  medications   Medication Sig Start Date End Date Taking? Authorizing Provider  acetaminophen  (TYLENOL ) 500 MG tablet Take 2 tablets (1,000 mg total) by mouth every 6 (six) hours as needed for moderate pain or mild pain. 09/08/22  Yes Anyanwu, Ugonna A, MD  ferrous sulfate  325 (65 FE) MG EC tablet Take 1 tablet (325 mg total) by mouth every other day. 06/15/22 12/12/22  Teena Feast, MD  ibuprofen  (ADVIL ) 600 MG  tablet Take 1 tablet (600 mg total) by mouth every 6 (six) hours as needed for moderate pain or cramping. 09/08/22   Anyanwu, Ugonna A, MD  methocarbamol  (ROBAXIN ) 500 MG tablet Take 1 tablet (500 mg total) by mouth 2 (two) times daily. 02/13/24   Felicie Horning, PA-C  NIFEdipine  (ADALAT  CC) 60 MG 24 hr tablet Take 1 tablet (60 mg total) by mouth daily. 10/26/22   Granville Layer, MD  Prenatal Vit-Fe Fumarate-FA (MULTIVITAMIN-PRENATAL) 27-0.8 MG TABS tablet Take 1 tablet by mouth daily 01/09/22   Teddi Favors, DO    Physical Exam: Vitals:   03/22/24 0318 03/22/24 0400 03/22/24 0415 03/22/24 0535  BP: 128/85 121/80 118/82 (!) 131/104  Pulse: 80 92 94 80  Resp: (!) 21  20 18   Temp:    98.7 F (37.1 C)  TempSrc:    Oral  SpO2: 97% 96% 96% 100%  Weight:      Height:        GENERAL: No acute distress.  Appears well.  HEENT: MMM.  Vision and hearing grossly intact.  NECK: Supple.  No apparent JVD.  RESP:  No IWOB. Good air movement bilaterally. CVS:  RRR. Heart sounds normal.  ABD/GI/GU: Bowel sounds present. Soft.  Mild diffuse tenderness mainly over periumbilical area. MSK/EXT:  Moves extremities. No apparent deformity or edema.  SKIN: no apparent skin lesion or wound NEURO: Awake, alert and oriented appropriately.  No gross deficit.  PSYCH: Calm. Normal affect.   Personally Reviewed Radiological Exams See HPI   Personally Reviewed Labs: CBC: Recent Labs  Lab 03/21/24 2237  WBC 8.7  HGB 11.7*  HCT 35.0*  MCV 86.0  PLT 360   Basic Metabolic Panel: Recent Labs  Lab 03/21/24 2237  NA 139  K 3.1*  CL 101  CO2 17*  GLUCOSE 77  BUN 11  CREATININE 0.78  CALCIUM  9.1   GFR: Estimated Creatinine Clearance: 99.2 mL/min (by C-G formula based on SCr of 0.78 mg/dL). Liver Function Tests: Recent Labs  Lab 03/21/24 2237  AST 14*  ALT <5  ALKPHOS 78  BILITOT 0.5  PROT 8.0  ALBUMIN 3.3*   Recent Labs  Lab 03/21/24 2237  LIPASE <10*   No results for input(s):  "AMMONIA" in the last 168 hours. Coagulation Profile: No results for input(s): "INR", "PROTIME" in the last 168 hours. Cardiac Enzymes: No results for input(s): "CKTOTAL", "CKMB", "CKMBINDEX", "TROPONINI" in the last 168 hours. BNP (last 3 results) No results for input(s): "PROBNP" in the last 8760 hours. HbA1C: No results for input(s): "HGBA1C" in the last 72 hours. CBG: No results for input(s): "GLUCAP" in the last 168 hours. Lipid Profile: No results for input(s): "CHOL", "HDL", "LDLCALC", "TRIG", "CHOLHDL", "LDLDIRECT" in the last 72 hours. Thyroid  Function Tests: No results for input(s): "TSH", "T4TOTAL", "FREET4", "T3FREE", "THYROIDAB" in the last 72 hours. Anemia Panel: No results for input(s): "VITAMINB12", "FOLATE", "FERRITIN", "TIBC", "IRON ", "RETICCTPCT" in the last 72 hours. Urine analysis:    Component Value Date/Time   COLORURINE AMBER (A) 03/21/2024  2330   APPEARANCEUR HAZY (A) 03/21/2024 2330   LABSPEC 1.025 03/21/2024 2330   PHURINE 6.5 03/21/2024 2330   GLUCOSEU NEGATIVE 03/21/2024 2330   HGBUR SMALL (A) 03/21/2024 2330   BILIRUBINUR MODERATE (A) 03/21/2024 2330   KETONESUR >=80 (A) 03/21/2024 2330   PROTEINUR 100 (A) 03/21/2024 2330   NITRITE NEGATIVE 03/21/2024 2330   LEUKOCYTESUR NEGATIVE 03/21/2024 2330     Assessment and plan: Crohn's ileitis: Presents with nausea, vomiting, diarrhea and abdominal pain.  Was not able to keep anything down that prompted her to come to ED.  No fever or leukocytosis to suggest infectious process.  Not on medication for Crohn's.  Reports 10/10 pain although she does not appear to be in that much distress.  Exam with mild periumbilical tenderness.  -[facility] consulted -Recheck CMP, CBC with differential -Check CRP and ESR -IV fluid, n.p.o. except sips with meds and ice chips pending GI evaluation -Multimodal pain control with Tylenol , oxycodone  and Dilaudid   High anion gap metabolic acidosis: Likely due to  dehydration/possible starvation ketosis.  Resolved  Normocytic anemia: H&H stable - Monitor  Hypokalemia: Resolved   DVT prophylaxis: SCD  Code Status: Full code Family Communication: None at bedside  Consults called: Gastroenterology Admission status: Inpatient   Theadore Finger MD [facility]  If 7PM-7AM, please contact night-coverage [email or web address]  03/22/2024, 7:26 AM

## 2024-03-22 NOTE — ED Notes (Signed)
 [facility] called for transport.

## 2024-03-22 NOTE — Consult Note (Signed)
 Consultation  Referring Provider:    Dr. Chadwick Colonel  Primary Care Physician:  Pcp, No Primary Gastroenterologist:      Dr. Karene Oto   Reason for Consultation:     Crohn's flare         HPI:   Jennifer Cain is a 28 y.o. female with a history of inflammatory Crohn's ileitis with multiple admissions and poor outpatient follow-up/patient adherence (secondary to financial constraints, lack of insurance) who presented to the emergency department yesterday with abdominal pain that been going on for the past month.  She has also been having nausea and vomiting and loose stools during this time.  She states that for the past year she has not had any problems with any GI symptoms such as abdominal pain, diarrhea or nausea/vomiting.  Her symptoms began rather abruptly and have been gradually worsening over the past few weeks.  She states that she has lost 50 pounds in the past month because she has been unable to eat.  She states that anytime she eats anything she will quickly vomit back up.  She also frequently vomits back up liquids.  She denies any fevers but has had subjective chills.  These symptoms are similar to previous flare symptoms she has had. She has been having 2 watery stools per day most days for the past month.  Typically, she has 1-2 formed stools per day.  She denies any blood in the stool.  No recent antibiotics.  She has been taking Tylenol  and Advil  for the abdominal pain, but denies taking frequent NSAIDs prior to the onset of her abdominal pain.  A CT scan in the ER was notable for wall thickening of the ileum, worsened compared to previous CT scans.  There was no evidence of small bowel dilation/obstruction, or suggestion of abscess/phlegmon.  The colon appeared normal. Her labs were notable for a mild anemia (hemoglobin 11.7, MCV 86), normal white count and platelets, CMP with hypoalbuminemia (albumin 2.4), otherwise normal electrolytes renal function.  Lactic acid normal.  CRP  elevated at 12.3, ESR 62.  No history of EIMs.  She tells me the reason she has not followed up as an outpatient is because she does not have insurance and cannot afford the medications that have been recommended.   Crohn's history She was diagnosed with Crohn's ileitis in August 2019 when hospitalized with abdominal pain, nausea, vomiting and unintentional weight loss.  EGD unremarkable.  Colonoscopy with mild terminal ileitis.  She responded very quickly to steroids but did not follow-up as directed following her hospitalization. She was seen by Dr. Karene Oto in Oct 2019 with plans to start Remicade vs. Entyvio, but never followed up.   She was rehospitalized June 2022 with anemia, heme positive stool and abdominal pain and underwent repeat GI evaluation at that time MR enterography showed active Crohn's of the distal and terminal ileum EGD negative Colonoscopy negative other than mild erythema at the terminal ileum, however biopsies showed active ileitis and random biopsies from the colon also showed findings consistent with active colitis.  She was last seen in our office in June 2023 when she was 5 months pregnant.  She did not have any active Crohn's symptoms at that time.  Given her history of poor adherence, no definitive plans for maintenance therapy were made at that visit, and she did not follow-up again but was given short prednisone  course in July 2023 because of symptoms of abdominal pain during her pregnancy.   CT abdomen/pelvis Mar 22, 2024 IMPRESSION:  Increased involvement of the ileum with wall thickening consistent with the given clinical history of Crohn's. No perforation or fistulization is seen.  CT abdomen/pelvis January 19, 2023 IMPRESSION: Active inflammatory Crohn's disease involving a long segment of the mid/distal ileum, as above. No evidence of complication.  MR enterography May 05, 2021 IMPRESSION: 1. Wall thickening and abnormal enhancement of the distal  and terminal ileum consistent with active Crohn's disease. 2. No other significant findings.  CT abdomen/pelvis March 16, 2018 IMPRESSION: 1. Circumferential wall thickening of the mid and distal small bowel compatible with enteritis, potentially infectious or inflammatory in etiology. 2. Multiple enlarged small bowel mesenteric lymph nodes may be reactive in etiology. 3. Bilateral lower lobe ground-glass nodularity may be infectious or inflammatory in etiology.  EGD August 2019 Normal  Colonoscopy August 2019 Normal-appearing colon mucosa.  TI not fully intubated but was essentially normal.  Mucus noted but no erosions or overt inflammation.  Biopsies demonstrating acute ileitis without chronic changes.   EGD June 2022 Normal  Colonoscopy June 2022 Mild patchy erythema in terminal ileum with biopsy showing active ileitis Colon appeared normal but biopsies with active colitis   Past Medical History:  Diagnosis Date   Crohn disease [facility])     Past Surgical History:  Procedure Laterality Date   BIOPSY  07/20/2018   Procedure: BIOPSY;  Surgeon: Alvis Jourdain, MD;  Location: [facility] ENDOSCOPY;  Service: Endoscopy;;   BIOPSY  05/04/2021   Procedure: BIOPSY;  Surgeon: Lindle Rhea, MD;  Location: [facility];  Service: Gastroenterology;;   CESAREAN SECTION N/A 09/06/2022   Procedure: CESAREAN SECTION;  Surgeon: Janeane Mealy, MD;  Location: [facility] LD [facility]S;  Service: Obstetrics;  Laterality: N/A;   COLONOSCOPY WITH PROPOFOL  N/A 07/20/2018   Procedure: COLONOSCOPY WITH PROPOFOL ;  Surgeon: Alvis Jourdain, MD;  Location: [facility] ENDOSCOPY;  Service: Endoscopy;  Laterality: N/A;   COLONOSCOPY WITH PROPOFOL  N/A 05/04/2021   Procedure: COLONOSCOPY WITH PROPOFOL ;  Surgeon: Lindle Rhea, MD;  Location: [facility] ENDOSCOPY;  Service: Gastroenterology;  Laterality: N/A;   ESOPHAGOGASTRODUODENOSCOPY (EGD) WITH PROPOFOL  N/A 07/20/2018   Procedure: ESOPHAGOGASTRODUODENOSCOPY (EGD) WITH PROPOFOL ;   Surgeon: Alvis Jourdain, MD;  Location: [facility] ENDOSCOPY;  Service: Endoscopy;  Laterality: N/A;   ESOPHAGOGASTRODUODENOSCOPY (EGD) WITH PROPOFOL  N/A 05/04/2021   Procedure: ESOPHAGOGASTRODUODENOSCOPY (EGD) WITH PROPOFOL ;  Surgeon: Lindle Rhea, MD;  Location: [facility] ENDOSCOPY;  Service: Gastroenterology;  Laterality: N/A;    Family History  Problem Relation Age of Onset   Stroke Mother    Heart disease Father    GI Disease Neg Hx    Autoimmune disease Neg Hx    Asthma Neg Hx    Cancer Neg Hx    Diabetes Neg Hx      Social History   Tobacco Use   Smoking status: Never    Passive exposure: Never   Smokeless tobacco: Never  Vaping Use   Vaping status: Never Used  Substance Use Topics   Alcohol use: Never   Drug use: Never    Prior to Admission medications   Medication Sig Start Date End Date Taking? Authorizing Provider  acetaminophen  (TYLENOL ) 500 MG tablet Take 2 tablets (1,000 mg total) by mouth every 6 (six) hours as needed for moderate pain or mild pain. 09/08/22  Yes Anyanwu, Ugonna A, MD  ferrous sulfate  325 (65 FE) MG EC tablet Take 1 tablet (325 mg total) by mouth every other day. 06/15/22 12/12/22  Teena Feast, MD  ibuprofen  (ADVIL ) 600 MG tablet Take 1 tablet (600 mg  total) by mouth every 6 (six) hours as needed for moderate pain or cramping. 09/08/22   Anyanwu, Ugonna A, MD  methocarbamol  (ROBAXIN ) 500 MG tablet Take 1 tablet (500 mg total) by mouth 2 (two) times daily. 02/13/24   Felicie Horning, PA-C  NIFEdipine  (ADALAT  CC) 60 MG 24 hr tablet Take 1 tablet (60 mg total) by mouth daily. 10/26/22   Granville Layer, MD  Prenatal Vit-Fe Fumarate-FA (MULTIVITAMIN-PRENATAL) 27-0.8 MG TABS tablet Take 1 tablet by mouth daily 01/09/22   Teddi Favors, DO    Current Facility-Administered Medications  Medication Dose Route Frequency Provider Last Rate Last Admin   0.9 %  sodium chloride  infusion   Intravenous Continuous Gonfa, Taye T, MD 75 mL/hr at 03/22/24 0315 New Bag at  03/22/24 0315   acetaminophen  (TYLENOL ) tablet 650 mg  650 mg Oral Q6H PRN Gonfa, Taye T, MD       Or   acetaminophen  (TYLENOL ) suppository 650 mg  650 mg Rectal Q6H PRN Gonfa, Taye T, MD       HYDROmorphone  (DILAUDID ) injection 0.5 mg  0.5 mg Intravenous Q4H PRN Gonfa, Taye T, MD       lactated ringers  1,000 mL with potassium chloride  20 mEq infusion   Intravenous Continuous Gonfa, Taye T, MD       magnesium  sulfate IVPB 2 g 50 mL  2 g Intravenous Once Gonfa, Taye T, MD       ondansetron  (ZOFRAN ) tablet 4 mg  4 mg Oral Q6H PRN Mae Schlossman, Taye T, MD       Or   ondansetron  (ZOFRAN ) injection 4 mg  4 mg Intravenous Q6H PRN Gonfa, Taye T, MD       oxyCODONE  (Oxy IR/ROXICODONE ) immediate release tablet 5 mg  5 mg Oral Q8H PRN Theadore Finger, MD        Allergies as of 03/21/2024   (No Known Allergies)     Review of Systems:    As per HPI, otherwise negative    Physical Exam:  Vital signs in last 24 hours: Temp:  [98.2 F (36.8 C)-99.6 F (37.6 C)] 98.2 F (36.8 C) (05/03 0827) Pulse Rate:  [70-111] 70 (05/03 0827) Resp:  [18-23] 18 (05/03 0827) BP: (118-131)/(80-104) 126/93 (05/03 0827) SpO2:  [96 %-100 %] 100 % (05/03 0827) Weight:  [68 kg] 68 kg (05/02 2234) Last BM Date : 03/21/24 General:   Pleasant African-American in NAD Head:  Normocephalic and atraumatic. Eyes:   No icterus.   Conjunctiva pink. Ears:  Normal auditory acuity. Neck:  Supple Lungs:  Respirations even and unlabored. Lungs clear to auscultation bilaterally.   No wheezes, crackles, or rhonchi.  Heart:  Regular rate and rhythm; no MRG Abdomen:  Soft, nondistended, tenderness to palpation in the right hemiabdomen without rigidity or guarding.  Normal bowel sounds. No appreciable masses or hepatomegaly.  Rectal:  Not performed.  Msk:  Symmetrical without gross deformities.  Extremities:  Without edema. Neurologic:  Alert and  oriented x4;  grossly normal neurologically. Skin:  Intact without significant lesions or  rashes. Psych:  Alert and cooperative. Normal affect.  LAB RESULTS: Recent Labs    03/21/24 2237 03/22/24 0708  WBC 8.7 5.2  HGB 11.7* 11.2*  HCT 35.0* 34.6*  PLT 360 306   BMET Recent Labs    03/21/24 2237 03/22/24 0708  NA 139 138  K 3.1* 3.6  CL 101 103  CO2 17* 24  GLUCOSE 77 96  BUN 11 7  CREATININE 0.78 0.72  CALCIUM  9.1 8.2*   LFT Recent Labs    03/22/24 0708  PROT 6.8  ALBUMIN 2.4*  AST 11*  ALT 10  ALKPHOS 54  BILITOT 0.6   PT/INR No results for input(s): "LABPROT", "INR" in the last 72 hours.  STUDIES: CT ABDOMEN PELVIS W CONTRAST Result Date: 03/22/2024 CLINICAL DATA:  Acute abdominal pain for 1, history of Crohn's EXAM: CT ABDOMEN AND PELVIS WITH CONTRAST TECHNIQUE: Multidetector CT imaging of the abdomen and pelvis was performed using the standard protocol following bolus administration of intravenous contrast. RADIATION DOSE REDUCTION: This exam was performed according to the departmental dose-optimization program which includes automated exposure control, adjustment of the mA and/or kV according to patient size and/or use of iterative reconstruction technique. CONTRAST:  OMNIPAQUE  IOHEXOL  300 MG/ML  SOLN COMPARISON:  01/19/2023 FINDINGS: Lower chest: No acute abnormality. Hepatobiliary: No focal liver abnormality is seen. No gallstones, gallbladder wall thickening, or biliary dilatation. Pancreas: Unremarkable. No pancreatic ductal dilatation or surrounding inflammatory changes. Spleen: Normal in size without focal abnormality. Adrenals/Urinary Tract: Adrenal glands are within normal limits. Kidneys demonstrate a normal enhancement pattern bilaterally. No renal calculi or obstructive changes are seen. The bladder is decompressed. Stomach/Bowel: No obstructive or inflammatory changes of the colon are noted. Appendix is within normal limits. Stomach is unremarkable. Proximal small bowel is unremarkable. Wall thickening is noted within the ileum. This is  slightly more involved when compared with the prior exam throughout the ileum. The proximal small bowel is within normal limits. Vascular/Lymphatic: No significant vascular findings are present. No enlarged abdominal or pelvic lymph nodes. Reproductive: Uterus and bilateral adnexa are unremarkable. IUD is noted in place. Other: No abdominal wall hernia or abnormality. No abdominopelvic ascites. Musculoskeletal: No acute or significant osseous findings. IMPRESSION: Increased involvement of the ileum with wall thickening consistent with the given clinical history of Crohn's. No perforation or fistulization is seen. Electronically Signed   By: Violeta Grey M.D.   On: 03/22/2024 00:56     PREVIOUS ENDOSCOPIES:            See above   Impression / Plan:   28 year old female with inflammatory ileal Crohn's disease with multiple admissions for flares, and lack of outpatient follow-up/medication adherence secondary to financial constraints/lack of insurance, admitted with 1 month of ongoing abdominal pain, nausea/vomiting, watery diarrhea.  CT again with evidence of inflammatory changes in the ileum, somewhat progressive since her last CT a year ago.  Thankfully, no evidence of obstruction or abscess/phlegmon.  Presentation consistent with Crohn's flare.  She has previously responded very well to IV/oral steroids.  Will plan again to treat with IV steroids until symptoms improve, and transition to oral with prolonged taper.  Will need to seek assistance from payment assistance programs to find a medication that she can afford as an outpatient.  Crohn's ileitis, acute flare - Solu-Medrol  20 mg IV twice daily, transition to oral once significant clinical improvement achieved - Advance diet to clear liquids today - Limit narcotics if possible, preferentially use acetaminophen  or tramadol - Recheck CRP tomorrow - Check baseline calprotectin - Check QuantiFERON gold/hepatitis B surface antigen - Patient should  be on DVT prophylaxis, high risk for VTE and hospitalized IBD patients - No need for repeat endoscopic evaluation at this time - Recommend social work consult to help patient find payment assistance programs to pay for maintenance Crohn's therapy - Nutrition consult  GI will continue to follow  Thanks   LOS: 0 days  Elois Hair  03/22/2024, 11:29 AM

## 2024-03-23 LAB — HEPATITIS B SURFACE ANTIGEN: Hepatitis B Surface Ag: NONREACTIVE

## 2024-03-23 LAB — C-REACTIVE PROTEIN: CRP: 9.8 mg/dL — ABNORMAL HIGH (ref <1.0)

## 2024-03-23 LAB — HIV ANTIBODY (ROUTINE TESTING W REFLEX): HIV Screen 4th Generation wRfx: NONREACTIVE

## 2024-03-23 NOTE — Progress Notes (Signed)
 PROGRESS NOTE  Jennifer Cain DOB: July 18, 1996   PCP: Pcp, No  Patient is from: Home.  DOA: 03/21/2024 LOS: 1  Chief complaints Chief Complaint  Patient presents with   Abdominal Pain     Brief Narrative / Interim history: 28 year old F with PMH of mild Crohn's ileitis not on meds presenting with nausea, vomiting and diarrhea, and admitted with Crohn's ileitis as noted on CT.  GI consulted and started on Solu-Medrol .  Subjective: Seen and examined earlier this morning.  No major events overnight of this morning.  Reports severe abdominal pain.  She rates the pain 10/10 although she does not appear to be in any distress.  Denies vomiting but reports spitting up.  Diarrhea resolved.  Objective: Vitals:   03/22/24 1106 03/22/24 1633 03/22/24 1932 03/23/24 0555  BP:  (!) 141/114 131/81 128/89  Pulse:  (!) 58 (!) 52 (!) 59  Resp:  19 18 16   Temp:  97.6 F (36.4 C) 97.7 F (36.5 C)   TempSrc:  Oral Oral   SpO2: 100% 100% 100% 100%  Weight:      Height:        Examination:  GENERAL: No apparent distress.  Nontoxic. HEENT: MMM.  Vision and hearing grossly intact.  NECK: Supple.  No apparent JVD.  RESP:  No IWOB.  Fair aeration bilaterally. CVS:  RRR. Heart sounds normal.  ABD/GI/GU: BS+. Abd soft.  Mild periumbilical tenderness. MSK/EXT:  Moves extremities. No apparent deformity. No edema.  SKIN: no apparent skin lesion or wound NEURO: Awake, alert and oriented appropriately.  No apparent focal neuro deficit. PSYCH: Calm. Normal affect.   Consultants:  Gastroenterology  Procedures: None  Microbiology summarized: None  Assessment and plan: Crohn's ileitis: Presents with nausea, vomiting, diarrhea and abdominal pain.  Was not able to keep anything down that prompted her to come to ED.  No fever or leukocytosis to suggest infectious process.  Not on medication for Crohn's.  Reports 10/10 pain although she does not appear to be in that much distress.  Exam  with mild periumbilical tenderness.  CRP 12.3>> 9.8.  Hep B nonreactive. -Appreciate help by GI-continue IV Solu-Medrol , limit narcotics, CRP, QuantiFERON gold and calprotectin -Continue clear liquid diet -Scheduled Tylenol  with as needed tramadol for pain control   High anion gap metabolic acidosis: Likely due to dehydration/possible starvation ketosis.  Resolved   Normocytic anemia: H&H stable - Monitor   Hypokalemia/hypomagnesemia:  - Monitor replenish as appropriate  Body mass index is 25.75 kg/m.          DVT prophylaxis:  enoxaparin  (LOVENOX ) injection 40 mg Start: 03/22/24 1615 SCDs Start: 03/22/24 1106  Code Status: Full code Family Communication: None at bedside Level of care: Med-Surg Status is: Inpatient Remains inpatient appropriate because: Crohn's ileitis   Final disposition: Home   35 minutes with more than 50% spent in reviewing records, counseling patient/family and coordinating care.   Sch Meds:  Scheduled Meds:  acetaminophen   1,000 mg Oral Q6H WA   enoxaparin  (LOVENOX ) injection  40 mg Subcutaneous Q24H   methylPREDNISolone  (SOLU-MEDROL ) injection  20 mg Intravenous Q12H   Continuous Infusions: PRN Meds:.ondansetron  **OR** ondansetron  (ZOFRAN ) IV, traMADol  Antimicrobials: Anti-infectives (From admission, onward)    None        I have personally reviewed the following labs and images: CBC: Recent Labs  Lab 03/21/24 2237 03/22/24 0708  WBC 8.7 5.2  HGB 11.7* 11.2*  HCT 35.0* 34.6*  MCV 86.0 88.9  PLT 360 306  BMP &GFR Recent Labs  Lab 03/21/24 2237 03/22/24 0708  NA 139 138  K 3.1* 3.6  CL 101 103  CO2 17* 24  GLUCOSE 77 96  BUN 11 7  CREATININE 0.78 0.72  CALCIUM  9.1 8.2*  MG  --  1.6*  PHOS  --  3.1   Estimated Creatinine Clearance: 99.2 mL/min (by C-G formula based on SCr of 0.72 mg/dL). Liver & Pancreas: Recent Labs  Lab 03/21/24 2237 03/22/24 0708  AST 14* 11*  ALT <5 10  ALKPHOS 78 54  BILITOT 0.5  0.6  PROT 8.0 6.8  ALBUMIN 3.3* 2.4*   Recent Labs  Lab 03/21/24 2237  LIPASE <10*   No results for input(s): "AMMONIA" in the last 168 hours. Diabetic: No results for input(s): "HGBA1C" in the last 72 hours. No results for input(s): "GLUCAP" in the last 168 hours. Cardiac Enzymes: No results for input(s): "CKTOTAL", "CKMB", "CKMBINDEX", "TROPONINI" in the last 168 hours. No results for input(s): "PROBNP" in the last 8760 hours. Coagulation Profile: No results for input(s): "INR", "PROTIME" in the last 168 hours. Thyroid  Function Tests: No results for input(s): "TSH", "T4TOTAL", "FREET4", "T3FREE", "THYROIDAB" in the last 72 hours. Lipid Profile: No results for input(s): "CHOL", "HDL", "LDLCALC", "TRIG", "CHOLHDL", "LDLDIRECT" in the last 72 hours. Anemia Panel: No results for input(s): "VITAMINB12", "FOLATE", "FERRITIN", "TIBC", "IRON ", "RETICCTPCT" in the last 72 hours. Urine analysis:    Component Value Date/Time   COLORURINE AMBER (A) 03/21/2024 2330   APPEARANCEUR HAZY (A) 03/21/2024 2330   LABSPEC 1.025 03/21/2024 2330   PHURINE 6.5 03/21/2024 2330   GLUCOSEU NEGATIVE 03/21/2024 2330   HGBUR SMALL (A) 03/21/2024 2330   BILIRUBINUR MODERATE (A) 03/21/2024 2330   KETONESUR >=80 (A) 03/21/2024 2330   PROTEINUR 100 (A) 03/21/2024 2330   NITRITE NEGATIVE 03/21/2024 2330   LEUKOCYTESUR NEGATIVE 03/21/2024 2330   Sepsis Labs: Invalid input(s): "PROCALCITONIN", "LACTICIDVEN"  Microbiology: No results found for this or any previous visit (from the past 240 hours).  Radiology Studies: No results found.    Jennifer Cain T. Angla Delahunt [facility]  If 7PM-7AM, please contact night-coverage [email or web address] 03/23/2024, 2:52 PM

## 2024-03-23 NOTE — Plan of Care (Signed)

## 2024-03-23 NOTE — Progress Notes (Signed)
 [facility] ROUNDING NOTE   Subjective: Patient feeling about the same today.  Had one episode vomiting and is tolerating small amounts of clears.  Abdominal pain about the same.  No bowel movements today.  CRP down a little from yesterday  HBsAg neg  Objective: Vital signs in last 24 hours: Temp:  [97.6 F (36.4 C)-97.7 F (36.5 C)] 97.7 F (36.5 C) (05/03 1932) Pulse Rate:  [52-59] 59 (05/04 0555) Resp:  [16-19] 16 (05/04 0555) BP: (128-141)/(81-114) 128/89 (05/04 0555) SpO2:  [100 %] 100 % (05/04 0555) Last BM Date : 03/21/24 General: NAD, tired appearing Af Am female Lungs:  CTA b/l, no w/r/r Heart:  RRR, no m/r/g Abdomen:  Soft, tenderness to palpation in R hemiabdomen, ND, +BS Ext:  No c/c/e    Intake/Output from previous day: 05/03 0701 - 05/04 0700 In: 2300 [P.O.:295; I.V.:2005] Out: -  Intake/Output this shift: No intake/output data recorded.   Lab Results: Recent Labs    03/21/24 2237 03/22/24 0708  WBC 8.7 5.2  HGB 11.7* 11.2*  PLT 360 306  MCV 86.0 88.9   BMET Recent Labs    03/21/24 2237 03/22/24 0708  NA 139 138  K 3.1* 3.6  CL 101 103  CO2 17* 24  GLUCOSE 77 96  BUN 11 7  CREATININE 0.78 0.72  CALCIUM  9.1 8.2*   LFT Recent Labs    03/21/24 2237 03/22/24 0708  PROT 8.0 6.8  ALBUMIN 3.3* 2.4*  AST 14* 11*  ALT <5 10  ALKPHOS 78 54  BILITOT 0.5 0.6   PT/INR No results for input(s): "INR" in the last 72 hours.    Imaging/Other results: CT ABDOMEN PELVIS W CONTRAST Result Date: 03/22/2024 CLINICAL DATA:  Acute abdominal pain for 1, history of Crohn's EXAM: CT ABDOMEN AND PELVIS WITH CONTRAST TECHNIQUE: Multidetector CT imaging of the abdomen and pelvis was performed using the standard protocol following bolus administration of intravenous contrast. RADIATION DOSE REDUCTION: This exam was performed according to the departmental dose-optimization program which includes automated exposure control, adjustment of the mA  and/or kV according to patient size and/or use of iterative reconstruction technique. CONTRAST:  OMNIPAQUE  IOHEXOL  300 MG/ML  SOLN COMPARISON:  01/19/2023 FINDINGS: Lower chest: No acute abnormality. Hepatobiliary: No focal liver abnormality is seen. No gallstones, gallbladder wall thickening, or biliary dilatation. Pancreas: Unremarkable. No pancreatic ductal dilatation or surrounding inflammatory changes. Spleen: Normal in size without focal abnormality. Adrenals/Urinary Tract: Adrenal glands are within normal limits. Kidneys demonstrate a normal enhancement pattern bilaterally. No renal calculi or obstructive changes are seen. The bladder is decompressed. Stomach/Bowel: No obstructive or inflammatory changes of the colon are noted. Appendix is within normal limits. Stomach is unremarkable. Proximal small bowel is unremarkable. Wall thickening is noted within the ileum. This is slightly more involved when compared with the prior exam throughout the ileum. The proximal small bowel is within normal limits. Vascular/Lymphatic: No significant vascular findings are present. No enlarged abdominal or pelvic lymph nodes. Reproductive: Uterus and bilateral adnexa are unremarkable. IUD is noted in place. Other: No abdominal wall hernia or abnormality. No abdominopelvic ascites. Musculoskeletal: No acute or significant osseous findings. IMPRESSION: Increased involvement of the ileum with wall thickening consistent with the given clinical history of Crohn's. No perforation or fistulization is seen. Electronically Signed   By: Violeta Grey M.D.   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  CT again with evidence of inflammatory  changes in the ileum, somewhat progressive since her last CT a year ago.  Thankfully, no evidence of obstruction or abscess/phlegmon.  Presentation consistent with Crohn's flare.  She has previously responded very well to IV/oral steroids.  Will plan again to treat with IV steroids until symptoms improve, and transition to oral with prolonged taper.  Will need to seek assistance from payment assistance programs to find a medication that she can afford as an outpatient.   Crohn's ileitis, acute flare - Continue Solu-Medrol  20 mg IV twice daily, transition to oral once significant clinical improvement achieved (first dose 5/3) - Continue diet to clear liquids today - Limit narcotics if possible, preferentially use acetaminophen  or tramadol - Trend CRP - Check baseline calprotectin (ordered) - F/u QuantiFERON gold for possible biologic therapy - Continue Lovenox  - No need for repeat endoscopic evaluation at this time - Recommend social work consult to help patient find payment assistance programs to pay for maintenance Crohn's therapy - Nutrition consult   GI will continue to follow  Dr. Karene Oto will be taking over inpatient GI service tomorrow   Elois Hair, MD  03/23/2024, 11:16 AM [facility]

## 2024-03-24 DIAGNOSIS — K50119 Crohn's disease of large intestine with unspecified complications: Principal | ICD-10-CM

## 2024-03-24 DIAGNOSIS — R1013 Epigastric pain: Secondary | ICD-10-CM

## 2024-03-24 DIAGNOSIS — R1310 Dysphagia, unspecified: Secondary | ICD-10-CM

## 2024-03-24 DIAGNOSIS — R1319 Other dysphagia: Secondary | ICD-10-CM

## 2024-03-24 LAB — CBC
HCT: 35 % — ABNORMAL LOW (ref 36.0–46.0)
Hemoglobin: 11.4 g/dL — ABNORMAL LOW (ref 12.0–15.0)
MCH: 28.6 pg (ref 26.0–34.0)
MCHC: 32.6 g/dL (ref 30.0–36.0)
MCV: 87.7 fL (ref 80.0–100.0)
Platelets: 384 10*3/uL (ref 150–400)
RBC: 3.99 MIL/uL (ref 3.87–5.11)
RDW: 14.6 % (ref 11.5–15.5)
WBC: 4.2 10*3/uL (ref 4.0–10.5)
nRBC: 0 % (ref 0.0–0.2)

## 2024-03-24 LAB — RENAL FUNCTION PANEL
Albumin: 2.3 g/dL — ABNORMAL LOW (ref 3.5–5.0)
Anion gap: 10 (ref 5–15)
BUN: 10 mg/dL (ref 6–20)
CO2: 25 mmol/L (ref 22–32)
Calcium: 8.9 mg/dL (ref 8.9–10.3)
Chloride: 104 mmol/L (ref 98–111)
Creatinine, Ser: 0.71 mg/dL (ref 0.44–1.00)
GFR, Estimated: >60 mL/min (ref >60)
Glucose, Bld: 87 mg/dL (ref 70–99)
Phosphorus: 3 mg/dL (ref 2.5–4.6)
Potassium: 4 mmol/L (ref 3.5–5.1)
Sodium: 139 mmol/L (ref 135–145)

## 2024-03-24 LAB — MAGNESIUM: Magnesium: 2.1 mg/dL (ref 1.7–2.4)

## 2024-03-24 LAB — C-REACTIVE PROTEIN: CRP: 6.2 mg/dL — ABNORMAL HIGH (ref <1.0)

## 2024-03-24 MED ORDER — PANTOPRAZOLE SODIUM 40 MG PO TBEC
40.0000 mg | DELAYED_RELEASE_TABLET | Freq: Every day | ORAL | Status: DC
  Administered 2024-03-24 – 2024-03-25 (×2): 40 mg via ORAL
  Filled 2024-03-24 (×2): qty 1

## 2024-03-24 NOTE — Progress Notes (Addendum)
 Patient ID: Jennifer Cain, female   DOB: December 27, 1995, 28 y.o.   MRN: [id number]     Attending physician's note   I have taken a history, reviewed the chart, and examined the patient. I performed a substantive portion of this encounter, including complete performance of at least one of the key components, in conjunction with the APP. I agree with the APP's note, impression, and recommendations with my edits.   Seems to be overall improved since hospital mission, but now reporting dysphagia and epigastric discomfort.  Reports this has been going on for the last week or so with difficulty swallowing.  Does have difficulty tolerating saliva at times.  Not odynophagia.  - Continue IV Solu-Medrol  - QuantiFERON gold and viral hepatitis panel drawn and pending - We discussed the need to transition to outpatient Crohn's therapy following discharge.  Major barrier for her has been lack of insurance and financial.  We discussed that today.  Would certainly benefit from social work assistance while in house to assist with insurance application, financial assistance, etc. - Plan for EGD tomorrow for diagnostic and potentially therapeutic intent, rule out UGI Crohn's, esophageal stricture, etc.  The indications, risks, and benefits of EGD were explained to the patient in detail. Risks include but are not limited to bleeding, perforation, adverse reaction to medications, and cardiopulmonary compromise. Sequelae include but are not limited to the possibility of surgery, hospitalization, and mortality. The patient verbalized understanding and wished to proceed.    Kayl Stogdill, DO, FACG [phone] office          Progress Note   Subjective   Day # 2 CC; Crohn's disease with exacerbation  CT abd/pelvis-proximal small bowel unremarkable, wall thickening is noted in the ileum, slightly more involved when compared with prior exam throughout the ileum  IV Solu-Medrol   Labs today-WBC 4.2/hemoglobin  11.4/hematocrit 35.0 CRP trending down now 6.2 BMET  unremarkable QuantiFERON gold and hepatitis B in process  Patient says she came to the hospital because she was unable to eat and had had symptoms for 5 to 6 days prior to admission.  She is still not eating anything and says that everything that she eats comes back up.  She points to her lower chest and says she feels as if everything is sitting in this area.  She did not have any episode of eating solid food where she felt as if she got anything stuck and does not necessarily feel like anything is stuck in her esophagus but is repeatedly spitting out saliva while I was in the room.  Mild lower abdominal pain   Objective   Vital signs in last 24 hours: Temp:  [97.5 F (36.4 C)-98.2 F (36.8 C)] 98.2 F (36.8 C) (05/05 0900) Pulse Rate:  [55-72] 55 (05/05 0900) Resp:  [16-19] 18 (05/05 0900) BP: (128-144)/(90-103) 142/93 (05/05 0900) SpO2:  [97 %-100 %] 100 % (05/05 0900) Last BM Date : 03/21/24 General:    Well-developed young African-American female in NAD Heart:  Regular rate and rhythm; no murmurs Lungs: Respirations even and unlabored, lungs CTA bilaterally Abdomen:  Soft, nondistended, bowel sounds are present , mild tenderness across the low mid abdomen Extremities:  Without edema. Neurologic:  Alert and oriented,  grossly normal neurologically. Psych:  Cooperative. Normal mood and affect.  Intake/Output from previous day: No intake/output data recorded. Intake/Output this shift: No intake/output data recorded.  Lab Results: Recent Labs    03/21/24 2237 03/22/24 0708 03/24/24 0814  WBC 8.7 5.2 4.2  HGB 11.7* 11.2* 11.4*  HCT 35.0* 34.6* 35.0*  PLT 360 306 384   BMET Recent Labs    03/21/24 2237 03/22/24 0708 03/24/24 0814  NA 139 138 139  K 3.1* 3.6 4.0  CL 101 103 104  CO2 17* 24 25  GLUCOSE 77 96 87  BUN 11 7 10   CREATININE 0.78 0.72 0.71  CALCIUM  9.1 8.2* 8.9   LFT Recent Labs    03/22/24 0708  03/24/24 0814  PROT 6.8  --   ALBUMIN 2.4* 2.3*  AST 11*  --   ALT 10  --   ALKPHOS 54  --   BILITOT 0.6  --    PT/INR No results for input(s): "LABPROT", "INR" in the last 72 hours.       Assessment / Plan:    #81-year-old female with inflammatory ileal Crohn's disease with multiple prior admissions with flares.  She has not had outpatient follow-up or ability to adhere to medications due to lack of insurance/financial constraints.  She had an admission about a month ago with abdominal pain nausea and vomiting and watery diarrhea.  Readmitted now 2 days ago with abdominal pain and complaints of nausea vomiting and inability to eat over the past 4 to 5 days.  Has  been started on IV Solu-Medrol  20 twice daily CT as outlined above does show mild ileal inflammation but no evidence of obstruction, no abscess or fistulization.  Today her primary symptom seems to be lack of ability to eat and feeling as if food is not making it to her stomach.  She is repeatedly spitting out small amounts of fluid and saliva into a cup.  She has not had prior esophageal issues or any previous evidence of upper gut Crohn's. I do not think her current symptoms are secondary to obstruction because her CT scan just shows wall thickening but no dilated bowel.   Question dysmotility, esophagitis,  Plan; continue full liquids, n.p.o. past midnight Add IV PPI twice daily ContinueSolu-Medrol  20 twice daily Patient will be scheduled for EGD with Dr. Karene Oto in a.m. tomorrow 03/25/2024.  Procedure was discussed in detail with the patient including indications risks and benefits and she is agreeable to proceed.  Further recommendations pending above     Principal Problem:   Crohn's disease (HCC)     LOS: 2 days   Amy EsterwoodPA-C  03/24/2024, 12:54 PM

## 2024-03-24 NOTE — Progress Notes (Signed)
 PROGRESS NOTE  Jennifer Cain ZOX:[id number] DOB: [birth year]   PCP: Pcp, No  Patient is from: Home.  DOA: 03/21/2024 LOS: 2  Chief complaints Chief Complaint  Patient presents with   Abdominal Pain     Brief Narrative / Interim history: 28 year old F with PMH of mild Crohn's ileitis not on meds currently due to lack of insurance/financial constraints presenting with nausea, vomiting and diarrhea, and admitted with Crohn's ileitis as noted on CT.  GI consulted and started on Solu-Medrol .  Diarrhea seems to have resolved.  Abdominal pain improved. Ongoing nausea and emesis.  GI planning EGD  Subjective: Seen and examined earlier this morning.  No major events overnight or this morning.  Continues to endorse abdominal pain.  Reports nausea and 1 episode of bilious emesis last night.  She is asking if she can have regular diet.  Playing video game on the phone.  Objective: Vitals:   03/23/24 2024 03/24/24 0516 03/24/24 0900 03/24/24 1643  BP: (!) 144/103 (!) 128/90 (!) 142/93 (!) 149/88  Pulse:  (!) 58 (!) 55 (!) 46  Resp: 16 16 18 18   Temp: (!) 97.5 F (36.4 C) 97.9 F (36.6 C) 98.2 F (36.8 C) 98.7 F (37.1 C)  TempSrc: Oral Oral    SpO2: 100% 97% 100% 100%  Weight:      Height:        Examination:  GENERAL: No apparent distress.  Nontoxic. HEENT: MMM.  Vision and hearing grossly intact.  NECK: Supple.  No apparent JVD.  RESP:  No IWOB.  Fair aeration bilaterally. CVS:  RRR. Heart sounds normal.  ABD/GI/GU: BS+. Abd soft.  No significant tenderness. MSK/EXT:  Moves extremities. No apparent deformity. No edema.  SKIN: no apparent skin lesion or wound NEURO: Awake, alert and oriented appropriately.  No apparent focal neuro deficit. PSYCH: Calm. Normal affect.   Consultants:  Gastroenterology  Procedures: None  Microbiology summarized: None  Assessment and plan: Crohn's ileitis: Presents with nausea, vomiting, diarrhea and abdominal pain.  Was not able to keep  anything down that prompted her to come to ED.  No fever or leukocytosis to suggest infectious process.  Not on medication for Crohn's.  Reports 10/10 pain although she does not appear to be in that much distress.  Exam with mild periumbilical tenderness.  CRP 12.3>> 9.8.  Hep B nonreactive. -Appreciate help by GI-continue IV Solu-Medrol , limit narcotics, CRP, QuantiFERON gold and calprotectin -Started IV Protonix  - Full liquid diet and n.p.o. after midnight for EGD -Scheduled Tylenol  with as needed tramadol for pain control  Nausea, vomiting and abdominal pain: Likely due to the above.  Improved. -GI planning EGD on 5/6 -Agree with IV Protonix  40 mg twice daily   High anion gap metabolic acidosis: Likely due to dehydration/possible starvation ketosis.  Resolved   Normocytic anemia: H&H stable - Monitor   Hypokalemia/hypomagnesemia:  - Monitor replenish as appropriate  Body mass index is 25.75 kg/m.          DVT prophylaxis:  enoxaparin  (LOVENOX ) injection 40 mg Start: 03/22/24 1615 SCDs Start: 03/22/24 1106  Code Status: Full code Family Communication: None at bedside Level of care: Med-Surg Status is: Inpatient Remains inpatient appropriate because: Crohn's ileitis, nausea, vomiting and abdominal pain   Final disposition: Home   35 minutes with more than 50% spent in reviewing records, counseling patient/family and coordinating care.   Sch Meds:  Scheduled Meds:  acetaminophen   1,000 mg Oral Q6H WA   enoxaparin  (LOVENOX ) injection  40 mg  Subcutaneous Q24H   methylPREDNISolone  (SOLU-MEDROL ) injection  20 mg Intravenous Q12H   pantoprazole   40 mg Oral Daily   Continuous Infusions: PRN Meds:.ondansetron  **OR** ondansetron  (ZOFRAN ) IV, traMADol  Antimicrobials: Anti-infectives (From admission, onward)    None        I have personally reviewed the following labs and images: CBC: Recent Labs  Lab 03/21/24 2237 03/22/24 0708 03/24/24 0814  WBC 8.7 5.2  4.2  HGB 11.7* 11.2* 11.4*  HCT 35.0* 34.6* 35.0*  MCV 86.0 88.9 87.7  PLT 360 306 384   BMP &GFR Recent Labs  Lab 03/21/24 2237 03/22/24 0708 03/24/24 0814  NA 139 138 139  K 3.1* 3.6 4.0  CL 101 103 104  CO2 17* 24 25  GLUCOSE 77 96 87  BUN 11 7 10   CREATININE 0.78 0.72 0.71  CALCIUM  9.1 8.2* 8.9  MG  --  1.6* 2.1  PHOS  --  3.1 3.0   Estimated Creatinine Clearance: 99.2 mL/min (by C-G formula based on SCr of 0.71 mg/dL). Liver & Pancreas: Recent Labs  Lab 03/21/24 2237 03/22/24 0708 03/24/24 0814  AST 14* 11*  --   ALT <5 10  --   ALKPHOS 78 54  --   BILITOT 0.5 0.6  --   PROT 8.0 6.8  --   ALBUMIN 3.3* 2.4* 2.3*   Recent Labs  Lab 03/21/24 2237  LIPASE <10*   No results for input(s): "AMMONIA" in the last 168 hours. Diabetic: No results for input(s): "HGBA1C" in the last 72 hours. No results for input(s): "GLUCAP" in the last 168 hours. Cardiac Enzymes: No results for input(s): "CKTOTAL", "CKMB", "CKMBINDEX", "TROPONINI" in the last 168 hours. No results for input(s): "PROBNP" in the last 8760 hours. Coagulation Profile: No results for input(s): "INR", "PROTIME" in the last 168 hours. Thyroid  Function Tests: No results for input(s): "TSH", "T4TOTAL", "FREET4", "T3FREE", "THYROIDAB" in the last 72 hours. Lipid Profile: No results for input(s): "CHOL", "HDL", "LDLCALC", "TRIG", "CHOLHDL", "LDLDIRECT" in the last 72 hours. Anemia Panel: No results for input(s): "VITAMINB12", "FOLATE", "FERRITIN", "TIBC", "IRON ", "RETICCTPCT" in the last 72 hours. Urine analysis:    Component Value Date/Time   COLORURINE AMBER (A) 03/21/2024 2330   APPEARANCEUR HAZY (A) 03/21/2024 2330   LABSPEC 1.025 03/21/2024 2330   PHURINE 6.5 03/21/2024 2330   GLUCOSEU NEGATIVE 03/21/2024 2330   HGBUR SMALL (A) 03/21/2024 2330   BILIRUBINUR MODERATE (A) 03/21/2024 2330   KETONESUR >=80 (A) 03/21/2024 2330   PROTEINUR 100 (A) 03/21/2024 2330   NITRITE NEGATIVE 03/21/2024 2330    LEUKOCYTESUR NEGATIVE 03/21/2024 2330   Sepsis Labs: Invalid input(s): "PROCALCITONIN", "LACTICIDVEN"  Microbiology: No results found for this or any previous visit (from the past 240 hours).  Radiology Studies: No results found.    Jennifer Cain T. Jennifer Cain [facility]  If 7PM-7AM, please contact night-coverage [email or web address] 03/24/2024, 4:43 PM

## 2024-03-24 NOTE — H&P (View-Only) (Signed)
 Patient ID: Jennifer Cain, female   DOB: December 27, 1995, 28 y.o.   MRN: [id number]     Attending physician's note   I have taken a history, reviewed the chart, and examined the patient. I performed a substantive portion of this encounter, including complete performance of at least one of the key components, in conjunction with the APP. I agree with the APP's note, impression, and recommendations with my edits.   Seems to be overall improved since hospital mission, but now reporting dysphagia and epigastric discomfort.  Reports this has been going on for the last week or so with difficulty swallowing.  Does have difficulty tolerating saliva at times.  Not odynophagia.  - Continue IV Solu-Medrol  - QuantiFERON gold and viral hepatitis panel drawn and pending - We discussed the need to transition to outpatient Crohn's therapy following discharge.  Major barrier for her has been lack of insurance and financial.  We discussed that today.  Would certainly benefit from social work assistance while in house to assist with insurance application, financial assistance, etc. - Plan for EGD tomorrow for diagnostic and potentially therapeutic intent, rule out UGI Crohn's, esophageal stricture, etc.  The indications, risks, and benefits of EGD were explained to the patient in detail. Risks include but are not limited to bleeding, perforation, adverse reaction to medications, and cardiopulmonary compromise. Sequelae include but are not limited to the possibility of surgery, hospitalization, and mortality. The patient verbalized understanding and wished to proceed.    Kayl Stogdill, DO, FACG [phone] office          Progress Note   Subjective   Day # 2 CC; Crohn's disease with exacerbation  CT abd/pelvis-proximal small bowel unremarkable, wall thickening is noted in the ileum, slightly more involved when compared with prior exam throughout the ileum  IV Solu-Medrol   Labs today-WBC 4.2/hemoglobin  11.4/hematocrit 35.0 CRP trending down now 6.2 BMET  unremarkable QuantiFERON gold and hepatitis B in process  Patient says she came to the hospital because she was unable to eat and had had symptoms for 5 to 6 days prior to admission.  She is still not eating anything and says that everything that she eats comes back up.  She points to her lower chest and says she feels as if everything is sitting in this area.  She did not have any episode of eating solid food where she felt as if she got anything stuck and does not necessarily feel like anything is stuck in her esophagus but is repeatedly spitting out saliva while I was in the room.  Mild lower abdominal pain   Objective   Vital signs in last 24 hours: Temp:  [97.5 F (36.4 C)-98.2 F (36.8 C)] 98.2 F (36.8 C) (05/05 0900) Pulse Rate:  [55-72] 55 (05/05 0900) Resp:  [16-19] 18 (05/05 0900) BP: (128-144)/(90-103) 142/93 (05/05 0900) SpO2:  [97 %-100 %] 100 % (05/05 0900) Last BM Date : 03/21/24 General:    Well-developed young African-American female in NAD Heart:  Regular rate and rhythm; no murmurs Lungs: Respirations even and unlabored, lungs CTA bilaterally Abdomen:  Soft, nondistended, bowel sounds are present , mild tenderness across the low mid abdomen Extremities:  Without edema. Neurologic:  Alert and oriented,  grossly normal neurologically. Psych:  Cooperative. Normal mood and affect.  Intake/Output from previous day: No intake/output data recorded. Intake/Output this shift: No intake/output data recorded.  Lab Results: Recent Labs    03/21/24 2237 03/22/24 0708 03/24/24 0814  WBC 8.7 5.2 4.2  HGB 11.7* 11.2* 11.4*  HCT 35.0* 34.6* 35.0*  PLT 360 306 384   BMET Recent Labs    03/21/24 2237 03/22/24 0708 03/24/24 0814  NA 139 138 139  K 3.1* 3.6 4.0  CL 101 103 104  CO2 17* 24 25  GLUCOSE 77 96 87  BUN 11 7 10   CREATININE 0.78 0.72 0.71  CALCIUM  9.1 8.2* 8.9   LFT Recent Labs    03/22/24 0708  03/24/24 0814  PROT 6.8  --   ALBUMIN 2.4* 2.3*  AST 11*  --   ALT 10  --   ALKPHOS 54  --   BILITOT 0.6  --    PT/INR No results for input(s): "LABPROT", "INR" in the last 72 hours.       Assessment / Plan:    #81-year-old female with inflammatory ileal Crohn's disease with multiple prior admissions with flares.  She has not had outpatient follow-up or ability to adhere to medications due to lack of insurance/financial constraints.  She had an admission about a month ago with abdominal pain nausea and vomiting and watery diarrhea.  Readmitted now 2 days ago with abdominal pain and complaints of nausea vomiting and inability to eat over the past 4 to 5 days.  Has  been started on IV Solu-Medrol  20 twice daily CT as outlined above does show mild ileal inflammation but no evidence of obstruction, no abscess or fistulization.  Today her primary symptom seems to be lack of ability to eat and feeling as if food is not making it to her stomach.  She is repeatedly spitting out small amounts of fluid and saliva into a cup.  She has not had prior esophageal issues or any previous evidence of upper gut Crohn's. I do not think her current symptoms are secondary to obstruction because her CT scan just shows wall thickening but no dilated bowel.   Question dysmotility, esophagitis,  Plan; continue full liquids, n.p.o. past midnight Add IV PPI twice daily ContinueSolu-Medrol  20 twice daily Patient will be scheduled for EGD with Dr. Karene Oto in a.m. tomorrow 03/25/2024.  Procedure was discussed in detail with the patient including indications risks and benefits and she is agreeable to proceed.  Further recommendations pending above     Principal Problem:   Crohn's disease (HCC)     LOS: 2 days   Amy EsterwoodPA-C  03/24/2024, 12:54 PM

## 2024-03-24 NOTE — Plan of Care (Signed)

## 2024-03-25 ENCOUNTER — Other Ambulatory Visit (HOSPITAL_COMMUNITY): Payer: Self-pay

## 2024-03-25 ENCOUNTER — Inpatient Hospital Stay (HOSPITAL_COMMUNITY): Payer: Self-pay | Admitting: Anesthesiology

## 2024-03-25 ENCOUNTER — Encounter (HOSPITAL_COMMUNITY): Payer: Self-pay | Admitting: Internal Medicine

## 2024-03-25 ENCOUNTER — Encounter (HOSPITAL_COMMUNITY)
Admission: EM | Disposition: A | Discharge status: Home / Self Care | Payer: Self-pay | Source: Home / Self Care | Attending: Internal Medicine

## 2024-03-25 DIAGNOSIS — R1319 Other dysphagia: Secondary | ICD-10-CM

## 2024-03-25 DIAGNOSIS — K50119 Crohn's disease of large intestine with unspecified complications: Secondary | ICD-10-CM

## 2024-03-25 DIAGNOSIS — K2950 Unspecified chronic gastritis without bleeding: Secondary | ICD-10-CM

## 2024-03-25 DIAGNOSIS — K2970 Gastritis, unspecified, without bleeding: Secondary | ICD-10-CM

## 2024-03-25 HISTORY — PX: ESOPHAGOGASTRODUODENOSCOPY: SHX5428

## 2024-03-25 LAB — HEPATITIS B CORE ANTIBODY, TOTAL: HEP B CORE AB: NEGATIVE

## 2024-03-25 LAB — HEPATITIS B SURFACE ANTIBODY,QUALITATIVE: Hep B S Ab: NONREACTIVE

## 2024-03-25 SURGERY — EGD (ESOPHAGOGASTRODUODENOSCOPY)
Anesthesia: Monitor Anesthesia Care

## 2024-03-25 MED ORDER — ONDANSETRON HCL 4 MG PO TABS
4.0000 mg | ORAL_TABLET | Freq: Four times a day (QID) | ORAL | 0 refills | Status: AC | PRN
  Filled 2024-03-25: qty 20, 5d supply, fill #0

## 2024-03-25 MED ORDER — SODIUM CHLORIDE 0.9 % IV SOLN
INTRAVENOUS | Status: AC | PRN
  Administered 2024-03-25: 500 mL via INTRAMUSCULAR

## 2024-03-25 MED ORDER — AMLODIPINE BESYLATE 5 MG PO TABS
5.0000 mg | ORAL_TABLET | Freq: Every day | ORAL | 0 refills | Status: DC
  Filled 2024-03-25: qty 30, 30d supply, fill #0

## 2024-03-25 MED ORDER — PREDNISONE 10 MG PO TABS
ORAL_TABLET | ORAL | 0 refills | Status: DC
  Filled 2024-03-25: qty 98, 35d supply, fill #0

## 2024-03-25 MED ORDER — PROPOFOL 500 MG/50ML IV EMUL
INTRAVENOUS | Status: DC | PRN
  Administered 2024-03-25 (×2): 30 mg via INTRAVENOUS
  Administered 2024-03-25: 100 ug/kg/min via INTRAVENOUS
  Administered 2024-03-25 (×2): 30 mg via INTRAVENOUS

## 2024-03-25 MED ORDER — PANTOPRAZOLE SODIUM 20 MG PO TBEC
20.0000 mg | DELAYED_RELEASE_TABLET | Freq: Every day | ORAL | 0 refills | Status: DC
  Filled 2024-03-25: qty 30, 30d supply, fill #0

## 2024-03-25 MED ORDER — LIDOCAINE 2% (20 MG/ML) 5 ML SYRINGE
INTRAMUSCULAR | Status: DC | PRN
  Administered 2024-03-25: 60 mg via INTRAVENOUS

## 2024-03-25 NOTE — Plan of Care (Signed)
 Patient A&O x 4. VSS, independent in the room. Pt has been NPO since midnight for EGD. Safety maintained. Call bell in reach will continue to monitor.   Problem: Education: Goal: Knowledge of General Education information will improve Description: Including pain rating scale, medication(s)/side effects and non-pharmacologic comfort measures Outcome: Progressing   Problem: Health Behavior/Discharge Planning: Goal: Ability to manage health-related needs will improve Outcome: Progressing   Problem: Clinical Measurements: Goal: Ability to maintain clinical measurements within normal limits will improve Outcome: Progressing Goal: Will remain free from infection Outcome: Progressing Goal: Diagnostic test results will improve Outcome: Progressing Goal: Respiratory complications will improve Outcome: Progressing Goal: Cardiovascular complication will be avoided Outcome: Progressing   Problem: Activity: Goal: Risk for activity intolerance will decrease Outcome: Progressing   Problem: Nutrition: Goal: Adequate nutrition will be maintained Outcome: Progressing   Problem: Coping: Goal: Level of anxiety will decrease Outcome: Progressing   Problem: Elimination: Goal: Will not experience complications related to bowel motility Outcome: Progressing Goal: Will not experience complications related to urinary retention Outcome: Progressing   Problem: Pain Managment: Goal: General experience of comfort will improve and/or be controlled Outcome: Progressing   Problem: Safety: Goal: Ability to remain free from injury will improve Outcome: Progressing   Problem: Skin Integrity: Goal: Risk for impaired skin integrity will decrease Outcome: Progressing

## 2024-03-25 NOTE — Plan of Care (Signed)

## 2024-03-25 NOTE — Progress Notes (Signed)
 Patient tolerated full liquids, now advancing to reg diet to see if she tolerates for potential discharge home per MD.

## 2024-03-25 NOTE — Anesthesia Preprocedure Evaluation (Signed)
 Anesthesia Evaluation  Patient identified by MRN, date of birth, ID band Patient awake    Reviewed: Allergy & Precautions, H&P , NPO status , Patient's Chart, lab work & pertinent test results  Airway Mallampati: II   Neck ROM: full    Dental   Pulmonary neg pulmonary ROS   breath sounds clear to auscultation       Cardiovascular negative cardio ROS  Rhythm:regular Rate:Normal     Neuro/Psych    GI/Hepatic Crohn disease   Endo/Other    Renal/GU      Musculoskeletal   Abdominal   Peds  Hematology   Anesthesia Other Findings   Reproductive/Obstetrics                             Anesthesia Physical Anesthesia Plan  ASA: 2  Anesthesia Plan: MAC   Post-op Pain Management:    Induction: Intravenous  PONV Risk Score and Plan: 2 and Propofol  infusion and Treatment may vary due to age or medical condition  Airway Management Planned: Nasal Cannula  Additional Equipment:   Intra-op Plan:   Post-operative Plan:   Informed Consent: I have reviewed the patients History and Physical, chart, labs and discussed the procedure including the risks, benefits and alternatives for the proposed anesthesia with the patient or authorized representative who has indicated his/her understanding and acceptance.     Dental advisory given  Plan Discussed with: CRNA, Anesthesiologist and Surgeon  Anesthesia Plan Comments:        Anesthesia Quick Evaluation

## 2024-03-25 NOTE — Discharge Summary (Addendum)
 Physician Discharge Summary  [place] Brier ZOX:[id number] DOB: Apr 13, 1996 DOA: 03/21/2024  PCP: Pcp, No  Admit date: 03/21/2024 Discharge date: 03/25/24  Admitted From: Home Disposition: Home Recommendations for Outpatient Follow-up:  Follow up with PCP in 1 week GI to arrange outpatient follow-up Check blood pressure, CMP and CBC at follow-up Please follow up on the following pending results: QuantiFERON gold, surgical pathology from EGD  Home Health: No need identified Equipment/Devices: No need identified  Discharge Condition: Stable CODE STATUS: Full code  Follow-up Information     Teena Feast, MD. Schedule an appointment as soon as possible for a visit in 1 week(s).   Specialty: Family Medicine Contact information: [address] [place] [address] [phone]                 Hospital course 28 year old F with PMH of mild Crohn's ileitis not on meds currently due to lack of insurance/financial constraints presenting with nausea, vomiting and diarrhea, and admitted with Crohn's ileitis as noted on CT.  GI consulted and started on Solu-Medrol .   Diarrhea seems to have resolved.  Abdominal pain improved. Ongoing nausea and emesis.  EGD on 5/5 showed some gastritis.  Biopsy taken.  Cleared for discharge by gastroenterology on prednisone  taper and low-dose PPI.  GI to arrange outpatient follow-up.   See individual problem list below for more.   Problems addressed during this hospitalization Crohn's ileitis: Presents with nausea, vomiting, diarrhea and abdominal pain.  Was not able to keep anything down that prompted her to come to ED.  No fever or leukocytosis to suggest infectious process.  Not on medication for Crohn's.  Reports 10/10 pain although she does not appear to be in that much distress.  Exam with mild periumbilical tenderness.  CRP 12.3>> 9.8>> 6.2.  Hep B nonreactive. -Received IV Solu-Medrol  in house.  Discharged on prednisone  taper  as below -P.o. Protonix  -GI to arrange outpatient follow-up   Nausea, vomiting and abdominal pain: Likely due to the above.  EGD with mild gastritis. -GI recommended discharging on p.o. Protonix  20 mg daily   High anion gap metabolic acidosis: Likely due to dehydration/possible starvation ketosis.  Resolved   Normocytic anemia: H&H stable   Hypokalemia/hypomagnesemia: Resolved  Elevated blood pressure: No history of HTN -Started low-dose amlodipine -Reassess blood pressure at follow-up           Time spent 35 minutes  Vital signs Vitals:   03/25/24 1427 03/25/24 1430 03/25/24 1440 03/25/24 1450  BP: (!) 157/85 (!) 153/97 (!) 153/110 (!) 166/100  Pulse: (!) 47 (!) 47 (!) 47 (!) 45  Temp: 97.8 F (36.6 C)     Resp: (!) 21 (!) 24 19 19   Height:      Weight:      SpO2: 100% 99% 98% 100%  TempSrc: Temporal     BMI (Calculated):         Discharge exam  GENERAL: No apparent distress.  Nontoxic. HEENT: MMM.  Vision and hearing grossly intact.  NECK: Supple.  No apparent JVD.  RESP:  No IWOB.  Fair aeration bilaterally. CVS:  RRR. Heart sounds normal.  ABD/GI/GU: BS+. Abd soft, NTND.  MSK/EXT:  Moves extremities. No apparent deformity. No edema.  SKIN: no apparent skin lesion or wound NEURO: Awake and alert. Oriented appropriately.  No apparent focal neuro deficit. PSYCH: Calm. Normal affect.   Discharge Instructions Discharge Instructions     Diet general   Complete by: As directed  Discharge instructions   Complete by: As directed    It has been a pleasure taking care of you!  You were hospitalized due to Crohn's flareup for which you have been started on a steroid.  Your symptoms improved.  We are discharging you on more prednisone  as recommended by gastroenterology.  Please take your medications as prescribed.  Follow-up with gastroenterology per their recommendation.   Take care,   Increase activity slowly   Complete by: As directed       Allergies  as of 03/25/2024   No Known Allergies      Medication List     TAKE these medications    acetaminophen  500 MG tablet Commonly known as: TYLENOL  Take 2 tablets (1,000 mg total) by mouth every 6 (six) hours as needed for moderate pain or mild pain.   amLODipine 5 MG tablet Commonly known as: NORVASC Take 1 tablet (5 mg total) by mouth daily.   ondansetron  4 MG tablet Commonly known as: Zofran  Take 1 tablet (4 mg total) by mouth every 6 (six) hours as needed for up to 5 days for nausea or vomiting.   pantoprazole  20 MG tablet Commonly known as: Protonix  Take 1 tablet (20 mg total) by mouth daily.   predniSONE  10 MG tablet Commonly known as: DELTASONE  Take 4 tablets (40 mg total) by mouth daily for 14 days, THEN 3 tablets (30 mg total) daily for 7 days, THEN 2 tablets (20 mg total) daily for 7 days, THEN 1 tablet (10 mg total) daily for 7 days. Start taking on: Mar 25, 2024        Consultations: Gastroenterology  Procedures/Studies: 5/5-EGD with minimal antral gastritis (biopsied)   CT ABDOMEN PELVIS W CONTRAST Result Date: 03/22/2024 CLINICAL DATA:  Acute abdominal pain for 1, history of Crohn's EXAM: CT ABDOMEN AND PELVIS WITH CONTRAST TECHNIQUE: Multidetector CT imaging of the abdomen and pelvis was performed using the standard protocol following bolus administration of intravenous contrast. RADIATION DOSE REDUCTION: This exam was performed according to the departmental dose-optimization program which includes automated exposure control, adjustment of the mA and/or kV according to patient size and/or use of iterative reconstruction technique. CONTRAST:  OMNIPAQUE  IOHEXOL  300 MG/ML  SOLN COMPARISON:  01/19/2023 FINDINGS: Lower chest: No acute abnormality. Hepatobiliary: No focal liver abnormality is seen. No gallstones, gallbladder wall thickening, or biliary dilatation. Pancreas: Unremarkable. No pancreatic ductal dilatation or surrounding inflammatory changes. Spleen:  Normal in size without focal abnormality. Adrenals/Urinary Tract: Adrenal glands are within normal limits. Kidneys demonstrate a normal enhancement pattern bilaterally. No renal calculi or obstructive changes are seen. The bladder is decompressed. Stomach/Bowel: No obstructive or inflammatory changes of the colon are noted. Appendix is within normal limits. Stomach is unremarkable. Proximal small bowel is unremarkable. Wall thickening is noted within the ileum. This is slightly more involved when compared with the prior exam throughout the ileum. The proximal small bowel is within normal limits. Vascular/Lymphatic: No significant vascular findings are present. No enlarged abdominal or pelvic lymph nodes. Reproductive: Uterus and bilateral adnexa are unremarkable. IUD is noted in place. Other: No abdominal wall hernia or abnormality. No abdominopelvic ascites. Musculoskeletal: No acute or significant osseous findings. IMPRESSION: Increased involvement of the ileum with wall thickening consistent with the given clinical history of Crohn's. No perforation or fistulization is seen. Electronically Signed   By: Violeta Grey M.D.   On: 03/22/2024 00:56       The results of significant diagnostics from this hospitalization (including imaging, microbiology,  ancillary and laboratory) are listed below for reference.     Microbiology: No results found for this or any previous visit (from the past 240 hours).   Labs:  CBC: Recent Labs  Lab 03/21/24 2237 03/22/24 0708 03/24/24 0814  WBC 8.7 5.2 4.2  HGB 11.7* 11.2* 11.4*  HCT 35.0* 34.6* 35.0*  MCV 86.0 88.9 87.7  PLT 360 306 384   BMP &GFR Recent Labs  Lab 03/21/24 2237 03/22/24 0708 03/24/24 0814  NA 139 138 139  K 3.1* 3.6 4.0  CL 101 103 104  CO2 17* 24 25  GLUCOSE 77 96 87  BUN 11 7 10   CREATININE 0.78 0.72 0.71  CALCIUM  9.1 8.2* 8.9  MG  --  1.6* 2.1  PHOS  --  3.1 3.0   Estimated Creatinine Clearance: 99.2 mL/min (by C-G formula  based on SCr of 0.71 mg/dL). Liver & Pancreas: Recent Labs  Lab 03/21/24 2237 03/22/24 0708 03/24/24 0814  AST 14* 11*  --   ALT <5 10  --   ALKPHOS 78 54  --   BILITOT 0.5 0.6  --   PROT 8.0 6.8  --   ALBUMIN 3.3* 2.4* 2.3*   Recent Labs  Lab 03/21/24 2237  LIPASE <10*   No results for input(s): "AMMONIA" in the last 168 hours. Diabetic: No results for input(s): "HGBA1C" in the last 72 hours. No results for input(s): "GLUCAP" in the last 168 hours. Cardiac Enzymes: No results for input(s): "CKTOTAL", "CKMB", "CKMBINDEX", "TROPONINI" in the last 168 hours. No results for input(s): "PROBNP" in the last 8760 hours. Coagulation Profile: No results for input(s): "INR", "PROTIME" in the last 168 hours. Thyroid  Function Tests: No results for input(s): "TSH", "T4TOTAL", "FREET4", "T3FREE", "THYROIDAB" in the last 72 hours. Lipid Profile: No results for input(s): "CHOL", "HDL", "LDLCALC", "TRIG", "CHOLHDL", "LDLDIRECT" in the last 72 hours. Anemia Panel: No results for input(s): "VITAMINB12", "FOLATE", "FERRITIN", "TIBC", "IRON ", "RETICCTPCT" in the last 72 hours. Urine analysis:    Component Value Date/Time   COLORURINE AMBER (A) 03/21/2024 2330   APPEARANCEUR HAZY (A) 03/21/2024 2330   LABSPEC 1.025 03/21/2024 2330   PHURINE 6.5 03/21/2024 2330   GLUCOSEU NEGATIVE 03/21/2024 2330   HGBUR SMALL (A) 03/21/2024 2330   BILIRUBINUR MODERATE (A) 03/21/2024 2330   KETONESUR >=80 (A) 03/21/2024 2330   PROTEINUR 100 (A) 03/21/2024 2330   NITRITE NEGATIVE 03/21/2024 2330   LEUKOCYTESUR NEGATIVE 03/21/2024 2330   Sepsis Labs: Invalid input(s): "PROCALCITONIN", "LACTICIDVEN"   SIGNED:  Theadore Finger, MD  [facility] 03/25/2024, 4:42 PM

## 2024-03-25 NOTE — Interval H&P Note (Signed)
 History and Physical Interval Note:  No acute events overnight.  Plan for EGD today to evaluate for UGI pathology with bxs and/or esophageal dilation as appropriate.   03/25/2024 11:20 AM  Jennifer Cain  has presented today for surgery, with the diagnosis of nausea/ vomting/ dysphagia.  The various methods of treatment have been discussed with the patient and family. After consideration of risks, benefits and other options for treatment, the patient has consented to  Procedure(s): EGD (ESOPHAGOGASTRODUODENOSCOPY) (N/A) as a surgical intervention.  The patient's history has been reviewed, patient examined, no change in status, stable for surgery.  I have reviewed the patient's chart and labs.  Questions were answered to the patient's satisfaction.     Jennifer Cain

## 2024-03-25 NOTE — Progress Notes (Signed)
 1530 Patient arrived back from PACU. A&Ox4. Follows commands. No voice complaints of pain or nausea. Up independent with steady gait.

## 2024-03-25 NOTE — Op Note (Signed)
 [facility] Patient Name: Jennifer Cain Procedure Date : 03/25/2024 MRN: [id number] Attending MD: Harry Lindau , MD, [id number] Date of Birth: 1996-08-10 CSN: [id number] Age: 28 Admit Type: Inpatient Procedure:                Upper GI endoscopy Indications:              Epigastric abdominal pain, Dysphagia Providers:                Harry Lindau, MD, Bradley Caffey, Arlin Benes, Technician, Olan Bering Referring MD:              Medicines:                Monitored Anesthesia Care Complications:            No immediate complications. Estimated Blood Loss:     Estimated blood loss was minimal. Procedure:                Pre-Anesthesia Assessment:                           - Prior to the procedure, a History and Physical                            was performed, and patient medications and                            allergies were reviewed. The patient's tolerance of                            previous anesthesia was also reviewed. The risks                            and benefits of the procedure and the sedation                            options and risks were discussed with the patient.                            All questions were answered, and informed consent                            was obtained. Prior Anticoagulants: The patient has                            taken no anticoagulant or antiplatelet agents. ASA                            Grade Assessment: II - A patient with mild systemic                            disease. After reviewing the risks and benefits,  the patient was deemed in satisfactory condition to                            undergo the procedure.                           After obtaining informed consent, the endoscope was                            passed under direct vision. Throughout the                            procedure, the patient's blood pressure, pulse, and                             oxygen saturations were monitored continuously. The                            [id number]-[id number] ([id number] Olympus endoscope was introduced                            through the mouth, and advanced to the second part                            of duodenum. The upper GI endoscopy was                            accomplished without difficulty. The patient                            tolerated the procedure well. Scope In: Scope Out: Findings:      The examined esophagus was normal.      The Z-line was regular and was found 32 cm from the incisors.      Localized minimal inflammation characterized by erythema was found in       the gastric antrum. Biopsies were taken with a cold forceps for       histology and Helicobacter pylori testing. Estimated blood loss was       minimal.      The gastric fundus, gastric body and incisura were normal. Additional       mucosal biopsies were taken with a cold forceps for Helicobacter pylori       testing. Estimated blood loss was minimal.      The gastroesophageal flap valve was visualized endoscopically and       classified as Hill Grade III (minimal fold, loose to endoscope, hiatal       hernia likely).      The examined duodenum was normal. Impression:               - Normal esophagus.                           - Z-line regular, 32 cm from the incisors.                           - Minimal non-ulcer antral gastritis. Biopsied.                           -  Normal gastric fundus, gastric body and incisura.                            Biopsied.                           - Gastroesophageal flap valve classified as Hill                            Grade III (minimal fold, loose to endoscope, hiatal                            hernia likely).                           - Normal examined duodenum. Recommendation:           - Return patient to hospital ward.                           - Advance diet as tolerated today.                           -  Continue present medications.                           - Await pathology results. Procedure Code(s):        --- Professional ---                           [phone], Esophagogastroduodenoscopy, flexible,                            transoral; with biopsy, single or multiple Diagnosis Code(s):        --- Professional ---                           K29.70, Gastritis, unspecified, without bleeding                           R10.13, Epigastric pain                           R13.10, Dysphagia, unspecified CPT copyright 2022 American Medical Association. All rights reserved. The codes documented in this report are preliminary and upon coder review may  be revised to meet current compliance requirements. Harry Lindau, MD 03/25/2024 2:27:26 PM Number of Addenda: 0

## 2024-03-25 NOTE — Progress Notes (Signed)
 DISCHARGE NOTE HOME [place] Double to be discharged Home per MD order. Discussed prescriptions and follow up appointments with the patient. Prescriptions given to patient; medication list explained in detail. Patient verbalized understanding.  Skin clean, dry and intact without evidence of skin break down, no evidence of skin tears noted. IV catheter discontinued intact. Site without signs and symptoms of complications. Dressing and pressure applied. Pt denies pain at the site currently. No complaints noted.  Patient free of lines, drains, and wounds.   An After Visit Summary (AVS) was printed and given to the patient. Patient escorted via wheelchair, and discharged home via private auto.  Tonda Francisco, RN

## 2024-03-25 NOTE — Progress Notes (Signed)
 Patient ate half of dinner. Did have some nausea which PRN zofran  given as ordered. Patient states she feels comfortable in discharging still. 1820 MD paged to notify awaiting response on if okay to discharge.   1830 MD agrees patient is okay for discharge. Discharge RN is now covering discharge process.

## 2024-03-25 NOTE — Transfer of Care (Signed)
 Immediate Anesthesia Transfer of Care Note  Patient: Jennifer Cain  Procedure(s) Performed: EGD (ESOPHAGOGASTRODUODENOSCOPY)  Patient Location: Endoscopy Unit  Anesthesia Type:MAC  Level of Consciousness: awake  Airway & Oxygen Therapy: Patient Spontanous Breathing and Patient connected to nasal cannula oxygen  Post-op Assessment: Report given to RN and Post -op Vital signs reviewed and stable  Post vital signs: Reviewed and stable  Last Vitals:  Vitals Value Taken Time  BP 153/97 03/25/24 1430  Temp 36.6 C 03/25/24 1427  Pulse 49 03/25/24 1432  Resp 17 03/25/24 1432  SpO2 100 % 03/25/24 1432  Vitals shown include unfiled device data.  Last Pain:  Vitals:   03/25/24 1427  TempSrc: Temporal  PainSc: 0-No pain      Patients Stated Pain Goal: 0 (03/23/24 2030)  Complications: No notable events documented.

## 2024-03-26 ENCOUNTER — Telehealth: Payer: Self-pay

## 2024-03-26 LAB — QUANTIFERON-TB GOLD PLUS (RQFGPL)
QuantiFERON Mitogen Value: 0.18 [IU]/mL
QuantiFERON Nil Value: 0.06 [IU]/mL
QuantiFERON TB1 Ag Value: 0.06 [IU]/mL
QuantiFERON TB2 Ag Value: 0.06 [IU]/mL

## 2024-03-26 LAB — QUANTIFERON-TB GOLD PLUS: QuantiFERON-TB Gold Plus: UNDETERMINED — AB

## 2024-03-26 NOTE — Telephone Encounter (Signed)
 Phone call to patient to make her aware that she is scheduled to follow up with Santina Cull, PA-C on 04-16-24 at 10:20am.  Patient agreed to plan and verbalized understanding. No further questions.

## 2024-03-26 NOTE — Anesthesia Postprocedure Evaluation (Signed)
 Anesthesia Post Note  Patient: Jennifer Cain  Procedure(s) Performed: EGD (ESOPHAGOGASTRODUODENOSCOPY)     Patient location during evaluation: Endoscopy Anesthesia Type: MAC Level of consciousness: awake and alert Pain management: pain level controlled Vital Signs Assessment: post-procedure vital signs reviewed and stable Respiratory status: spontaneous breathing, nonlabored ventilation, respiratory function stable and patient connected to nasal cannula oxygen Cardiovascular status: stable and blood pressure returned to baseline Postop Assessment: no apparent nausea or vomiting Anesthetic complications: no   No notable events documented.  Last Vitals:  Vitals:   03/25/24 1450 03/25/24 1741  BP: (!) 166/100 (!) 125/92  Pulse: (!) 45 (!) 51  Resp: 19 18  Temp:    SpO2: 100% 100%    Last Pain:  Vitals:   03/25/24 1741  TempSrc:   PainSc: 0-No pain                 Lailee Hoelzel S

## 2024-03-26 NOTE — Telephone Encounter (Signed)
-----   Message from Annis Kinder sent at 03/25/2024  2:28 PM EDT ----- I anticipate this patient will be discharged today or tomorrow.  Sending home on prednisone  40 mg daily x 2 weeks, then taper by 10 mg every 7 days.  Will need follow-up appointment with me or one of the APP's in the next 2-4 weeks.  Thanks.

## 2024-03-27 ENCOUNTER — Encounter (HOSPITAL_COMMUNITY): Payer: Self-pay | Admitting: Gastroenterology

## 2024-03-27 LAB — SURGICAL PATHOLOGY

## 2024-03-29 ENCOUNTER — Encounter: Payer: Self-pay | Admitting: Gastroenterology

## 2024-04-15 NOTE — Progress Notes (Addendum)
 04/16/2024 Jennifer Cain [id number] 1996/02/06  Referring provider: No ref. provider found Primary GI doctor: Dr. 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 Recent admission May 2025 with Crohn's flare seen on CT Last colonoscopy 2022 Negative hepatitis B surface antigen and TB during hospitalization Responds well to steroids, has actually never been on maintenance therapy for Crohn's disease due to medical noncompliance and socioeconomic hardship Was on 1 month taper of steroids, about 2 weeks ago she had return of diarrhea when she tapered from 30 mg to 20 mg, no urgency, continuing epigastric pain has improved some but continues, taper did not effect it About 20 lbs weight loss - add on ensure - Will refill prednisone  taper 30 mg 14 days and taper down 20 mg 14 days then 10 mg 7 days, did discuss prednisone  side effects and how this is not a long-term medication - Will get CRP sed rate, CBC, c-Met, B12, iron  and ferritin - Will schedule for upper GI with small bowel follow-through, may need to consider MRE - Long discussion with the patient in regards to Crohn's disease and pathophysiology, discussed that we need consistency with medication and treatment in order to prevent complications in future such as hospitalizations, surgery, infection etc. - Gave patient again information for financial assistance through [place] and encouraged her to follow-up with Medicaid - Discussed about anti-TNF's specifically Remicade  as patient prefers an infusion, believe with inconsistencies patient is likely not a candidate for Imuran.  Sent in for infusion center.  -Will discuss with Dr. San before prescribing - Discussed immunizations, discussed risk factors with anti-TNF's such as need to have skin checks, regular Pap smears, risk of lymphoma etc. - Would suggest follow-up colonoscopy 6 to 12 months for mucosal healing - Close follow-up 6-8 weeks or next available  with Dr. 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 Intermittent GERD Protonix  20 mg once daily, may have helped some with epigastric Still uncertain if this may be stricture/UGI crohn's, no evidence on CT - increase protonix  to 40 mg once a day, some improvement with the medication and for gastric protection while she is on steroids - Upper GI with small bowel follow-through to evaluate for stricture  Socioeconomic hardship  resulting in poor follow-ups and medical noncompliance - Given papers again for [place] assistance - Encourage patient to follow-up with Medicaid see if she is eligible  Patient Care Team: Pcp, No as PCP - General Lola Donnice HERO, MD as PCP - OBGYN (Family Medicine)  HISTORY OF PRESENT ILLNESS: 28 y.o. female presents for evaluation of inflammatory Crohn's ileitis with history of poor outpatient adherence.  Last seen in the office on June 2023 with recent hospitalization for Crohn's flare 5/3 through 5/6.   IBD history: She was diagnosed with Crohn's ileitis in August 2019 when hospitalized with abdominal pain, nausea, vomiting and unintentional weight loss.  EGD unremarkable.  Colonoscopy with mild terminal ileitis.  She responded very quickly to steroids but did not follow-up as directed following her hospitalization. She was seen by Dr. San in Oct 2019 with plans  to start Remicade  vs. Entyvio, but never followed up.   She was rehospitalized June 2022 with anemia, heme positive stool and abdominal pain and underwent repeat GI evaluation at that  time MR enterography showed active Crohn's of the distal and terminal ileum EGD negative Colonoscopy negative other than mild erythema at the terminal ileum, however biopsies showed active ileitis and random biopsies from the colon also showed findings consistent with active colitis. 03/22/2024 show in for Crohn's flare CT  Increased involvement of the ileum with wall thickening consistent with the given clinical history of Crohn's. No perforation or fistulization is seen.  EGD negative H. pylori gastritis.  Responded well to IV Solu-Medrol  negative TB Gold and hepatitis.  Last colonoscopy: Colonoscopy June 2022 Mild patchy erythema in terminal ileum with biopsy showing active ileitis Colon appeared normal but biopsies with active colitis. Last small bowel imaging:   MR enterography May 05, 2021 1. Wall thickening and abnormal enhancement of the distal and terminal ileum consistent with active Crohn's disease. 2. No other significant findings. 03/22/2024 CT ABP with contrast ncreased involvement of the ileum with wall thickening consistent with the given clinical history of Crohn's. No perforation or fistulization is seen. Extraintestinal manifestations:  Has bumps on tongue that burn, possible glossitis, bilateral knee pain/joint pain Surgical history: no surgery.  Other significant medical history:  Current History Discussed the use of AI scribe software for clinical note transcription with the patient, who gave verbal consent to proceed.  History of Present Illness   Jennifer Cain is a 28 year old female with Crohn's disease who presents with symptoms following a recent hospitalization for a Crohn's flare. She is accompanied by her 70-month-old child, Jennifer Cain.  She was hospitalized from May 3rd to May 6th for a Crohn's flare, during which she experienced nausea and vomiting. These symptoms have since improved, but she now has small, painful, burning bumps on her tongue that started a few  days ago. No rashes are present elsewhere on her body.  She describes joint pain in her legs, particularly in her knees, as soreness similar to post-exercise discomfort. No joint pain is noted in her feet, ankles, or back.  She was treated with IV Solu-Medrol  during her hospital stay and was discharged on a steroid taper. Currently, she is on the last phase of her prednisone  taper, taking one tablet daily. Diarrhea returned approximately two weeks ago, coinciding with the reduction from 30 mg to 20 mg of prednisone . No nausea, vomiting, or blood in her stool, but there are increased bowel movements without urgency or weight loss.  She experiences abdominal pain located in the upper abdomen, which is unchanged with the steroid taper. Eating provides some relief, and she does not get full easily. Certain foods, like a cold cut sandwich with tomatoes and lettuce, exacerbate her stomach discomfort.  She is taking pantoprazole  20 mg once daily since her hospital discharge and experiences occasional heartburn. There is noted improvement in her symptoms since starting the medication. No numbness, tingling, or fatigue.  She has a history of low iron  in 2023 and is not currently on an iron  supplement. She denies smoking, alcohol use, and NSAID use. She is on birth control and denies any current pregnancy plans.      Recent labs: 03/24/2024 CRP 6.2     Component Value Date/Time   CRP 6.2 (H) 03/24/2024 0814   CRP 9.8 (H) 03/23/2024 0539   CRP 12.3 (H) 03/22/2024 0708    03/22/2024 SED RATE 62  Fecal cal 05/18/2022 7770 03/24/2024 WBC 4.2 HGB 11.4 MCV 87.7 Platelets 384 05/17/2022 Iron  47 Ferritin 10.0  B12 525 03/22/2024 AST 11 ALT 10 Alkphos 54 TBili 0.6  TB GOLD 03/24/2024 Indeterminate or TB skin if indeterminate.  HepBsAG 03/23/2024 NON REACTIVE  TPMT Activity: No results found for requested labs within last 1095 days.  IBD Health Care Maintenance: Given information in regards to vaccinations encouraged  to get influenza and pneumonia vaccinations.  Immunization History  Administered Date(s) Administered   Influenza,inj,Quad PF,6+ Mos 07/09/2018   Tdap 06/14/2022   Wt Readings from Last 3 Encounters:  04/16/24 137 lb (62.1 kg)  03/25/24 149 lb 14.6 oz (68 kg)  02/13/24 157 lb 8 oz (71.4 kg)    RELEVANT GI HISTORY, LABS, IMAGING: CT abdomen/pelvis Mar 22, 2024 IMPRESSION: Increased involvement of the ileum with wall thickening consistent with the given clinical history of Crohn's. No perforation or fistulization is seen.   CT abdomen/pelvis January 19, 2023 IMPRESSION: Active inflammatory Crohn's disease involving a long segment of the mid/distal ileum, as above. No evidence of complication.   MR enterography May 05, 2021 IMPRESSION: 1. Wall thickening and abnormal enhancement of the distal and terminal ileum consistent with active Crohn's disease. 2. No other significant findings.   CT abdomen/pelvis March 16, 2018 IMPRESSION: 1. Circumferential wall thickening of the mid and distal small bowel compatible with enteritis, potentially infectious or inflammatory in etiology. 2. Multiple enlarged small bowel mesenteric lymph nodes may be reactive in etiology. 3. Bilateral lower lobe ground-glass nodularity may be infectious or inflammatory in etiology.   EGD August 2019 Normal   Colonoscopy August 2019 Normal-appearing colon mucosa.  TI not fully intubated but was essentially normal.  Mucus noted but no erosions or overt inflammation.  Biopsies demonstrating acute ileitis without chronic changes.     EGD June 2022 Normal   Colonoscopy June 2022 Mild patchy erythema in terminal ileum with biopsy showing active ileitis Colon appeared normal but biopsies with active colitis  CBC    Component Value Date/Time   WBC 4.2 03/24/2024 0814   RBC 3.99 03/24/2024 0814   HGB 11.4 (L) 03/24/2024 0814   HGB 9.6 (L) 06/19/2022 1358   HCT 35.0 (L) 03/24/2024 0814   HCT 29.1 (L)  06/19/2022 1358   PLT 384 03/24/2024 0814   PLT 242 06/19/2022 1358   MCV 87.7 03/24/2024 0814   MCV 88 06/19/2022 1358   MCH 28.6 03/24/2024 0814   MCHC 32.6 03/24/2024 0814   RDW 14.6 03/24/2024 0814   RDW 13.6 06/19/2022 1358   LYMPHSABS 2.2 06/16/2022 1933   LYMPHSABS 1.8 02/08/2022 1123   MONOABS 0.6 06/16/2022 1933   EOSABS 0.1 06/16/2022 1933   EOSABS 0.0 02/08/2022 1123   BASOSABS 0.0 06/16/2022 1933   BASOSABS 0.0 02/08/2022 1123   Recent Labs    03/21/24 2237 03/22/24 0708 03/24/24 0814  HGB 11.7* 11.2* 11.4*    CMP     Component Value Date/Time   NA 139 03/24/2024 0814   NA 136 02/08/2022 1123   K 4.0 03/24/2024 0814   CL 104 03/24/2024 0814   CO2 25 03/24/2024 0814   GLUCOSE 87 03/24/2024 0814   BUN 10 03/24/2024 0814   BUN 9 02/08/2022 1123   CREATININE 0.71 03/24/2024 0814   CALCIUM  8.9 03/24/2024 0814   PROT 6.8 03/22/2024 0708   PROT 8.1 02/08/2022 1123   ALBUMIN 2.3 (L) 03/24/2024 0814   ALBUMIN 4.3 02/08/2022 1123   AST 11 (L)  03/22/2024 0708   ALT 10 03/22/2024 0708   ALKPHOS 54 03/22/2024 0708   BILITOT 0.6 03/22/2024 0708   BILITOT 0.4 02/08/2022 1123   GFRNONAA >60 03/24/2024 0814   GFRAA >60 07/24/2018 0644      Latest Ref Rng & Units 03/24/2024    8:14 AM 03/22/2024    7:08 AM 03/21/2024   10:37 PM  Hepatic Function  Total Protein 6.5 - 8.1 g/dL  6.8  8.0   Albumin 3.5 - 5.0 g/dL 2.3  2.4  3.3   AST 15 - 41 U/L  11  14   ALT 0 - 44 U/L  10  <5   Alk Phosphatase 38 - 126 U/L  54  78   Total Bilirubin 0.0 - 1.2 mg/dL  0.6  0.5       Current Medications:   Current Outpatient Medications (Endocrine & Metabolic):    predniSONE  (DELTASONE ) 10 MG tablet, Take 3 tablets (30 mg total) by mouth daily for 14 days, THEN 2 tablets (20 mg total) daily for 14 days, THEN 1 tablet (10 mg total) daily for 7 days, THEN 1 tablet (10 mg total) every other day for 10 days.  Current Outpatient Medications (Cardiovascular):    amLODipine  (NORVASC ) 5 MG  tablet, Take 1 tablet (5 mg total) by mouth daily.   Current Outpatient Medications (Analgesics):    acetaminophen  (TYLENOL ) 500 MG tablet, Take 2 tablets (1,000 mg total) by mouth every 6 (six) hours as needed for moderate pain or mild pain.   Current Outpatient Medications (Other):    pantoprazole  (PROTONIX ) 40 MG tablet, Take 1 tablet (40 mg total) by mouth daily.  Medical History:  Past Medical History:  Diagnosis Date   Crohn disease (HCC)    Allergies: No Known Allergies   Surgical History:  She  has a past surgical history that includes Esophagogastroduodenoscopy (egd) with propofol  (N/A, 07/20/2018); Colonoscopy with propofol  (N/A, 07/20/2018); biopsy (07/20/2018); Esophagogastroduodenoscopy (egd) with propofol  (N/A, 05/04/2021); Colonoscopy with propofol  (N/A, 05/04/2021); biopsy (05/04/2021); Cesarean section (N/A, 09/06/2022); and Esophagogastroduodenoscopy (N/A, 03/25/2024). Family History:  Her family history includes Heart disease in her father; Stroke in her mother.  REVIEW OF SYSTEMS  : All other systems reviewed and negative except where noted in the History of Present Illness.  PHYSICAL EXAM: BP 120/78 (BP Location: Left Arm, Patient Position: Sitting, Cuff Size: Normal)   Pulse 100   Ht 5' 4 (1.626 m)   Wt 137 lb (62.1 kg)   BMI 23.52 kg/m  Physical Exam   GENERAL APPEARANCE: Thin appearing, in no apparent distress. HEENT: No cervical lymphadenopathy, unremarkable thyroid , sclerae anicteric, conjunctiva pink. Glossitis present on tongue. RESPIRATORY: Respiratory effort normal, breath sounds equal bilaterally without rales, rhonchi, or wheezing. Lungs clear to auscultation. CARDIO: Regular rate and rhythm with no murmurs, rubs, or gallops, peripheral pulses intact. ABDOMEN: Soft, non-distended, increased bowel sounds right upper quadrant, right lower quadrant discomfort, no rebound, no mass appreciated. RECTAL: Declines. MUSCULOSKELETAL: Full range of motion, normal  gait, without edema. SKIN: Dry, intact without rashes or lesions. No jaundice. NEURO: Alert, oriented, no focal deficits. PSYCH: Cooperative, normal mood and affect.      Alan JONELLE Coombs, PA-C 11:16 AM

## 2024-04-16 ENCOUNTER — Other Ambulatory Visit (INDEPENDENT_AMBULATORY_CARE_PROVIDER_SITE_OTHER): Payer: Self-pay

## 2024-04-16 ENCOUNTER — Ambulatory Visit (INDEPENDENT_AMBULATORY_CARE_PROVIDER_SITE_OTHER): Payer: Self-pay | Admitting: Physician Assistant

## 2024-04-16 ENCOUNTER — Encounter: Payer: Self-pay | Admitting: Physician Assistant

## 2024-04-16 VITALS — BP 120/78 | HR 100 | Ht 64.0 in | Wt 137.0 lb

## 2024-04-16 DIAGNOSIS — K5000 Crohn's disease of small intestine without complications: Secondary | ICD-10-CM

## 2024-04-16 DIAGNOSIS — Z862 Personal history of diseases of the blood and blood-forming organs and certain disorders involving the immune mechanism: Secondary | ICD-10-CM

## 2024-04-16 DIAGNOSIS — E538 Deficiency of other specified B group vitamins: Secondary | ICD-10-CM

## 2024-04-16 DIAGNOSIS — K140 Glossitis: Secondary | ICD-10-CM

## 2024-04-16 DIAGNOSIS — R1013 Epigastric pain: Secondary | ICD-10-CM

## 2024-04-16 DIAGNOSIS — Z599 Problem related to housing and economic circumstances, unspecified: Secondary | ICD-10-CM

## 2024-04-16 DIAGNOSIS — D649 Anemia, unspecified: Secondary | ICD-10-CM

## 2024-04-16 DIAGNOSIS — K219 Gastro-esophageal reflux disease without esophagitis: Secondary | ICD-10-CM

## 2024-04-16 LAB — IBC + FERRITIN
Ferritin: 83.9 ng/mL (ref 10.0–291.0)
Iron: 34 ug/dL — ABNORMAL LOW (ref 42–145)
Saturation Ratios: 14.3 % — ABNORMAL LOW (ref 20.0–50.0)
TIBC: 238 ug/dL — ABNORMAL LOW (ref 250.0–450.0)
Transferrin: 170 mg/dL — ABNORMAL LOW (ref 212.0–360.0)

## 2024-04-16 LAB — HEPATIC FUNCTION PANEL
ALT: 7 U/L (ref 0–35)
AST: 7 U/L (ref 0–37)
Albumin: 3.5 g/dL (ref 3.5–5.2)
Alkaline Phosphatase: 68 U/L (ref 39–117)
Bilirubin, Direct: 0.1 mg/dL (ref 0.0–0.3)
Total Bilirubin: 0.4 mg/dL (ref 0.2–1.2)
Total Protein: 7.7 g/dL (ref 6.0–8.3)

## 2024-04-16 LAB — CBC WITH DIFFERENTIAL/PLATELET
Basophils Absolute: 0 10*3/uL (ref 0.0–0.1)
Basophils Relative: 0.2 % (ref 0.0–3.0)
Eosinophils Absolute: 0 10*3/uL (ref 0.0–0.7)
Eosinophils Relative: 0.1 % (ref 0.0–5.0)
HCT: 34.1 % — ABNORMAL LOW (ref 36.0–46.0)
Hemoglobin: 11.2 g/dL — ABNORMAL LOW (ref 12.0–15.0)
Lymphocytes Relative: 15.5 % (ref 12.0–46.0)
Lymphs Abs: 0.7 10*3/uL (ref 0.7–4.0)
MCHC: 33 g/dL (ref 30.0–36.0)
MCV: 88.7 fl (ref 78.0–100.0)
Monocytes Absolute: 0.2 10*3/uL (ref 0.1–1.0)
Monocytes Relative: 3.9 % (ref 3.0–12.0)
Neutro Abs: 3.6 10*3/uL (ref 1.4–7.7)
Neutrophils Relative %: 80.3 % — ABNORMAL HIGH (ref 43.0–77.0)
Platelets: 386 10*3/uL (ref 150.0–400.0)
RBC: 3.84 Mil/uL — ABNORMAL LOW (ref 3.87–5.11)
RDW: 15.6 % — ABNORMAL HIGH (ref 11.5–15.5)
WBC: 4.5 10*3/uL (ref 4.0–10.5)

## 2024-04-16 LAB — BASIC METABOLIC PANEL WITH GFR
BUN: 11 mg/dL (ref 6–23)
CO2: 28 meq/L (ref 19–32)
Calcium: 9 mg/dL (ref 8.4–10.5)
Chloride: 104 meq/L (ref 96–112)
Creatinine, Ser: 0.59 mg/dL (ref 0.40–1.20)
GFR: 122.95 mL/min (ref >60.00)
Glucose, Bld: 67 mg/dL — ABNORMAL LOW (ref 70–99)
Potassium: 3.3 meq/L — ABNORMAL LOW (ref 3.5–5.1)
Sodium: 140 meq/L (ref 135–145)

## 2024-04-16 LAB — SEDIMENTATION RATE: Sed Rate: 92 mm/h — ABNORMAL HIGH (ref 0–20)

## 2024-04-16 LAB — VITAMIN B12: Vitamin B-12: 314 pg/mL (ref 211–911)

## 2024-04-16 LAB — C-REACTIVE PROTEIN: CRP: 8.2 mg/dL (ref 0.5–20.0)

## 2024-04-16 MED ORDER — PANTOPRAZOLE SODIUM 40 MG PO TBEC: 40.0000 mg | DELAYED_RELEASE_TABLET | Freq: Every day | ORAL | 3 refills | Status: DC

## 2024-04-16 MED ORDER — PREDNISONE 10 MG PO TABS: ORAL_TABLET | ORAL | 0 refills | Status: AC

## 2024-04-16 NOTE — Patient Instructions (Signed)
 Your provider has requested that you go to the basement level for lab work before leaving today. Press "B" on the elevator. The lab is located at the first door on the left as you exit the elevator.  We have sent the following medications to your pharmacy for you to pick up at your convenience: Prednisone  and pantoprazole   You have been scheduled for a small bowel follow thru at [facility]. Your appointment is on 05/05/24 at 10 am. Please arrive 30 minutes prior to your test for registration. Make certain not to have anything to eat or drink starting Midnight on the night before your test. If for some reason you need to reschedule your test, please call radiology at [phone].  ____________________________________________________________________ The Small Bowel Follow Thru examination is used to visualize the entire small bowel (intestines); specifically the connection between the small and large intestine. You will be positioned on a flat x-ray table and an image of your abdomen taken. Then the technologist will show the x-ray to the radiologist. The radiologist will instruct your technologist how much (1-2 cups) barium sulfate  you will drink and when to begin taking the timed x-rays, usually 15-30 minutes after you begin drinking. Barium is a harmless substance that will highlight your small intestine by absorbing x-ray. The taste is chalky and it feels very heavy both in the cup and in your stomach.  After the first x-ray is taken and shown to the radiologist, he/she will determine when the next image is to be taken. This is repeated until the barium has reached the end of the small intestine and enters the beginning of the colon (cecum). At such time when the barium spills into the colon, you will be positioned on the x-ray table once again. The radiologist will use a fluoroscopic camera to take some detailed pictures of the connection between your small intestine and colon. The fluoroscope is  an x-ray unit that works with a television/computer screen. The radiologist will apply pressure to your abdomen with his/her hand and a lead glove, a plastic paddle, or a paddle with an inflated rubber balloon on the end. This is to spread apart your loops of intestine so he/she can see all areas.   This test typically takes around 1 hour to complete.  **Important** Drink plenty of water (8-10 cups/day) for a few days following the procedure to avoid constipation and blockage. The barium will make your stools white for a few days. ____________________________________________________________________   Health Care Maintenance in IBD  Screening Prior to Initiation of Advanced Therapy  Hepatitis B surface antigen, hepatitis B surface antibody, and hepatitis B core antibody Quantiferon TB Gold. If positive need chest xray and T spot. Documentation of varicella vaccination, and without knowledge of childhood disease should also be tested for antibodies to varicella-zoster virus.  Seronegative patients who are not yet immunosuppressed should have VCV vaccination. Complete 4 weeks prior to starting medication. This is extremely important prior to initiation of S1Ps. Prior to initiating an oral S1Ps: Complete blood count including lymphocyte count, LFTs, EKG to screen for cardiac conduction abnormalities, and ophthalmic evaluation to screen for uveitis or macular edema Baseline dermatologic and with etrasimod Avoid initiation of biologic in setting of uncontrolled active infection.  Vaccinations -COVID-19 all adults -Influenza annually -Pneumonia vaccine patient's greater than or equal to 19 -Shingrix all patients with IBD greater than or equal to 19 -HPV all adults greater than or equal to 21 and 28 years of age Between the ages  of 26-45 of age shared decision between patient and provider -Hepatitis A for all adults not previously vaccinated -Hepatitis B all adults with IBD -Meningococcal A, C, W,  Y: Men B -Tdap all adults & pregnant patients MMR 2 dose live vaccine Varicella 2 dose live vaccine  Pearls: Vaccines are not associated with IBD flares and are safe to administer if no contraindications are present. The live vaccines are contraindicated in immunocompromise patients (intranasal flu, yellow fever, VZV) Vaccine should be offered ordered at all follow-up appointments  Bone health Evidence of osteoporosis in patients with IBD is reported to be as high as 42%. Recommend Bone density scans with PCP.  Women over age 36 in men over age 59 should ensure daily intake of vitamin D  [phone] IUs and calcium  1200 mg.  Smoking cessation Colon cancer screening per guidelines Other cancer screening With melanoma and nonmelanoma skin malignancies are associated with therapies used for IBD.   Non melanoma skin cancers associated with past or current use of thiopurine's.  Patient should be advised to avoid excessive sun's exposure and use a high-strength sunscreen and sun protective measures. All IBD patients who have taken, are currently taking, or are planning to start immunomodulators or biologic therapy should  undergo annual skin examinations by a dermatologist. Cervical cancer screening should be annual for women with IBD on immunosuppressive therapy. Anal Pap smear (MSM, immunosuppression, women with history of cervical, vulvar or vaginal SIL or cancer, woman with history of cervical HPV 16 infection)  Mental health High rates of depression anxiety and IBD patients as high as 20% Sceening for anxiety and depression annually  Labs Hemoglobin, CRP, electrolytes, albumin, ferritin, transferrin, vitamin D , B12 B6. Malnutrition labs include magnesium , phosphorus, folate, thiamine , zinc.   Therapeutic drug monitoring can be used in: Patients with active disease despite thiopurine therapy Patient is with adverse effects that are possibly due to thiopurine therapy Lipid panel at  baseline in 8 weeks when initiating therapy with a Jak inhibitor

## 2024-04-17 ENCOUNTER — Ambulatory Visit: Payer: Self-pay | Admitting: Physician Assistant

## 2024-04-17 ENCOUNTER — Encounter: Payer: Self-pay | Admitting: Physician Assistant

## 2024-04-17 DIAGNOSIS — E876 Hypokalemia: Secondary | ICD-10-CM

## 2024-04-17 MED ORDER — POTASSIUM CHLORIDE CRYS ER 20 MEQ PO TBCR: 20.0000 meq | EXTENDED_RELEASE_TABLET | Freq: Once | ORAL | 0 refills | Status: DC

## 2024-04-17 NOTE — Addendum Note (Signed)
 Addended by: Santina Cull on: 04/17/2024 11:42 AM   Modules accepted: Orders

## 2024-04-23 NOTE — Progress Notes (Signed)
 Agree with the assessment and plan as outlined by Santina Cull, PA-C. Agree with plan to initiate Remicade and seeking [place] financial assistance.  Depending on affordability, may also need to seek assistance from manufacturer on this or another biologic agent that offers patient assistance program (ie [employer]).  Kaid Seeberger, DO, [facility]

## 2024-05-05 ENCOUNTER — Encounter (HOSPITAL_COMMUNITY): Payer: Self-pay

## 2024-05-05 ENCOUNTER — Ambulatory Visit (HOSPITAL_COMMUNITY): Admission: RE | Admit: 2024-05-05 | Payer: Self-pay | Source: Ambulatory Visit

## 2024-05-13 ENCOUNTER — Telehealth: Payer: Self-pay

## 2024-05-13 NOTE — Telephone Encounter (Signed)
 Jennifer Cain, patient has been approved for patient assistance and will be scheduled as soon as possible.  Patient is receiving Advance Medication - Supplied Externally. Medication: Remicade Manufacture: [employer] Approval Dates: Approved from 05/12/2024 until 05/11/2025. ID:  Reason: Self Pay First DOS: TBD

## 2024-05-14 NOTE — Telephone Encounter (Signed)
Please see note from pt and advise.  °

## 2024-05-21 ENCOUNTER — Ambulatory Visit: Payer: Self-pay | Admitting: *Deleted

## 2024-05-21 VITALS — BP 118/81 | HR 85 | Temp 97.8°F | Resp 14 | Ht 63.0 in | Wt 120.6 lb

## 2024-05-21 DIAGNOSIS — K5000 Crohn's disease of small intestine without complications: Secondary | ICD-10-CM

## 2024-05-21 MED ORDER — ACETAMINOPHEN 325 MG PO TABS
650.0000 mg | ORAL_TABLET | Freq: Once | ORAL | Status: AC
  Administered 2024-05-21: 650 mg via ORAL
  Filled 2024-05-21: qty 2

## 2024-05-21 MED ORDER — DIPHENHYDRAMINE HCL 25 MG PO CAPS
25.0000 mg | ORAL_CAPSULE | Freq: Once | ORAL | Status: AC
  Administered 2024-05-21: 25 mg via ORAL
  Filled 2024-05-21: qty 1

## 2024-05-21 MED ORDER — METHYLPREDNISOLONE SODIUM SUCC 40 MG IJ SOLR
40.0000 mg | Freq: Once | INTRAMUSCULAR | Status: AC
  Administered 2024-05-21: 40 mg via INTRAVENOUS
  Filled 2024-05-21: qty 1

## 2024-05-21 MED ORDER — SODIUM CHLORIDE 0.9 % IV SOLN
5.0000 mg/kg | Freq: Once | INTRAVENOUS | Status: AC
  Administered 2024-05-21: 300 mg via INTRAVENOUS
  Filled 2024-05-21: qty 30

## 2024-05-21 NOTE — Progress Notes (Signed)
 Diagnosis: Crohn's Disease  Provider:  Praveen Mannam MD  Procedure: IV Infusion  IV Type: Peripheral, IV Location: R Hand  Remicade (Infliximab), Dose: 300 mg  Infusion Start Time: 1236  Infusion Stop Time: 1440  Post Infusion IV Care: Observation period completed  Discharge: Condition: Good, Destination: Home . AVS Declined  Performed by:  Mathew Therisa NOVAK, RN

## 2024-06-03 ENCOUNTER — Encounter: Payer: Self-pay | Admitting: Physician Assistant

## 2024-06-04 ENCOUNTER — Ambulatory Visit: Payer: Self-pay

## 2024-06-04 MED ORDER — ACETAMINOPHEN 325 MG PO TABS: 650.0000 mg | ORAL_TABLET | Freq: Once | ORAL | Status: DC

## 2024-06-04 MED ORDER — METHYLPREDNISOLONE SODIUM SUCC 40 MG IJ SOLR
40.0000 mg | Freq: Once | INTRAMUSCULAR | Status: DC
Start: 2024-06-04 — End: 2024-06-05

## 2024-06-04 MED ORDER — DIPHENHYDRAMINE HCL 25 MG PO CAPS
25.0000 mg | ORAL_CAPSULE | Freq: Once | ORAL | Status: DC
Start: 2024-06-04 — End: 2024-06-05

## 2024-06-05 ENCOUNTER — Encounter: Payer: Self-pay | Admitting: Physician Assistant

## 2024-06-18 ENCOUNTER — Ambulatory Visit: Payer: Self-pay | Admitting: Gastroenterology

## 2024-07-24 ENCOUNTER — Telehealth: Payer: Self-pay

## 2024-07-24 NOTE — Telephone Encounter (Signed)
-----   Message from Alan JONELLE Coombs sent at 07/24/2024 12:53 PM EDT ----- Please set up follow up with Dr. San, needs OV

## 2024-07-24 NOTE — Telephone Encounter (Signed)
 Patient has been scheduled for a follow up with Dr. San on 09/09/24 at 9:20 am. Appt information sent via MyChart letter and a copy of the letter will be mailed to patient.

## 2024-08-20 ENCOUNTER — Ambulatory Visit (INDEPENDENT_AMBULATORY_CARE_PROVIDER_SITE_OTHER): Payer: Self-pay

## 2024-08-20 ENCOUNTER — Encounter: Payer: Self-pay | Admitting: Gastroenterology

## 2024-08-20 VITALS — BP 113/73 | HR 65 | Temp 98.2°F | Resp 16 | Ht 63.0 in | Wt 118.8 lb

## 2024-08-20 DIAGNOSIS — K5000 Crohn's disease of small intestine without complications: Secondary | ICD-10-CM

## 2024-08-20 MED ORDER — ACETAMINOPHEN 325 MG PO TABS
650.0000 mg | ORAL_TABLET | Freq: Once | ORAL | Status: AC
  Administered 2024-08-20: 650 mg via ORAL
  Filled 2024-08-20: qty 2

## 2024-08-20 MED ORDER — METHYLPREDNISOLONE SODIUM SUCC 40 MG IJ SOLR
40.0000 mg | Freq: Once | INTRAMUSCULAR | Status: AC
  Administered 2024-08-20: 40 mg via INTRAVENOUS
  Filled 2024-08-20: qty 1

## 2024-08-20 MED ORDER — SODIUM CHLORIDE 0.9 % IV SOLN
5.0000 mg/kg | Freq: Once | INTRAVENOUS | Status: AC
  Administered 2024-08-20: 300 mg via INTRAVENOUS
  Filled 2024-08-20: qty 30

## 2024-08-20 MED ORDER — DIPHENHYDRAMINE HCL 25 MG PO CAPS
25.0000 mg | ORAL_CAPSULE | Freq: Once | ORAL | Status: AC
  Administered 2024-08-20: 25 mg via ORAL
  Filled 2024-08-20: qty 1

## 2024-08-20 NOTE — Progress Notes (Signed)
 Diagnosis: Crohn's Disease  Provider:  Praveen Mannam MD  Procedure: IV Infusion  IV Type: Peripheral, IV Location: L Forearm  Remicade  (Infliximab ), Dose: 300 mg  Infusion Start Time: 1332  Infusion Stop Time: 1542  Post Infusion IV Care: Peripheral IV Discontinued  Discharge: Condition: Good, Destination: Home . AVS Declined  Performed by:  Leita FORBES Miles, LPN

## 2024-09-09 ENCOUNTER — Ambulatory Visit: Payer: Self-pay | Admitting: Gastroenterology

## 2024-09-11 ENCOUNTER — Telehealth: Payer: Self-pay

## 2024-09-11 NOTE — Telephone Encounter (Signed)
-----   Message from Sandor LULLA Flatter sent at 09/11/2024  9:39 AM EDT ----- This patient was a last-minute cancellation with me in the office again this week.  She has several no-shows or last-minute cancellations.  She has moderate to severe Crohn's disease with multiple previous hospitalizations, but we finally have her heading in the right direction on Remicade .  Can you please send a certified letter to let her know that keeping regular follow-up in our office is extremely important for her ongoing care.  Any future missed appointments, including same-day cancellations, will result in her dismissal from our practice as I cannot continue prescribing Remicade  without regular in-office evaluations and labs to see how she is doing.  She has been rescheduled with Deanna, and she very much needs to keep that appointment.  Thanks.

## 2024-09-11 NOTE — Telephone Encounter (Signed)
 We no longer send certified letters, Dr. San notified. Working on letter to patient, last letter that was mailed was returned. I have sent a MyChart message to patient inquiring about an updated address.

## 2024-09-17 ENCOUNTER — Ambulatory Visit: Payer: Self-pay

## 2024-09-17 VITALS — BP 122/92 | HR 89 | Temp 98.0°F | Resp 18 | Ht 64.0 in | Wt 130.0 lb

## 2024-09-17 DIAGNOSIS — K5000 Crohn's disease of small intestine without complications: Secondary | ICD-10-CM

## 2024-09-17 MED ORDER — DIPHENHYDRAMINE HCL 25 MG PO CAPS
25.0000 mg | ORAL_CAPSULE | Freq: Once | ORAL | Status: AC
  Administered 2024-09-17: 25 mg via ORAL
  Filled 2024-09-17: qty 1

## 2024-09-17 MED ORDER — SODIUM CHLORIDE 0.9 % IV SOLN
5.0000 mg/kg | Freq: Once | INTRAVENOUS | Status: AC
  Administered 2024-09-17: 300 mg via INTRAVENOUS
  Filled 2024-09-17: qty 30

## 2024-09-17 MED ORDER — METHYLPREDNISOLONE SODIUM SUCC 40 MG IJ SOLR
40.0000 mg | Freq: Once | INTRAMUSCULAR | Status: AC
  Administered 2024-09-17: 40 mg via INTRAVENOUS
  Filled 2024-09-17: qty 1

## 2024-09-17 MED ORDER — ACETAMINOPHEN 325 MG PO TABS
650.0000 mg | ORAL_TABLET | Freq: Once | ORAL | Status: AC
  Administered 2024-09-17: 650 mg via ORAL
  Filled 2024-09-17: qty 2

## 2024-09-17 NOTE — Progress Notes (Signed)
 Diagnosis: Crohn's Disease  Provider:  Mannam, Praveen MD  Procedure: IV Infusion  IV Type: Peripheral, IV Location: L Forearm  Remicade  (Infliximab ), Dose: 300 mg  Infusion Start Time: 1351  Infusion Stop Time: 1616  Post Infusion IV Care: Peripheral IV Discontinued  Discharge: Condition: Good, Destination: Home . AVS Declined  Performed by:  Rocky FORBES Sar, RN

## 2024-10-23 ENCOUNTER — Ambulatory Visit: Payer: Self-pay | Admitting: Gastroenterology

## 2024-10-23 ENCOUNTER — Other Ambulatory Visit: Payer: Self-pay

## 2024-10-23 ENCOUNTER — Encounter: Payer: Self-pay | Admitting: Gastroenterology

## 2024-10-23 VITALS — BP 124/78 | HR 67 | Ht 63.0 in | Wt 143.4 lb

## 2024-10-23 DIAGNOSIS — D509 Iron deficiency anemia, unspecified: Secondary | ICD-10-CM

## 2024-10-23 DIAGNOSIS — R5383 Other fatigue: Secondary | ICD-10-CM

## 2024-10-23 DIAGNOSIS — K219 Gastro-esophageal reflux disease without esophagitis: Secondary | ICD-10-CM

## 2024-10-23 DIAGNOSIS — E538 Deficiency of other specified B group vitamins: Secondary | ICD-10-CM

## 2024-10-23 DIAGNOSIS — R634 Abnormal weight loss: Secondary | ICD-10-CM

## 2024-10-23 DIAGNOSIS — K50919 Crohn's disease, unspecified, with unspecified complications: Secondary | ICD-10-CM

## 2024-10-23 DIAGNOSIS — R1013 Epigastric pain: Secondary | ICD-10-CM

## 2024-10-23 LAB — COMPREHENSIVE METABOLIC PANEL WITH GFR
ALT: 7 U/L (ref 0–35)
AST: 11 U/L (ref 0–37)
Albumin: 4 g/dL (ref 3.5–5.2)
Alkaline Phosphatase: 60 U/L (ref 39–117)
BUN: 10 mg/dL (ref 6–23)
CO2: 27 meq/L (ref 19–32)
Calcium: 9.3 mg/dL (ref 8.4–10.5)
Chloride: 104 meq/L (ref 96–112)
Creatinine, Ser: 0.6 mg/dL (ref 0.40–1.20)
GFR: 122 mL/min (ref >60.00)
Glucose, Bld: 63 mg/dL — ABNORMAL LOW (ref 70–99)
Potassium: 4 meq/L (ref 3.5–5.1)
Sodium: 139 meq/L (ref 135–145)
Total Bilirubin: 0.3 mg/dL (ref 0.2–1.2)
Total Protein: 7.4 g/dL (ref 6.0–8.3)

## 2024-10-23 LAB — CBC WITH DIFFERENTIAL/PLATELET
Basophils Absolute: 0 K/uL (ref 0.0–0.1)
Basophils Relative: 0.6 % (ref 0.0–3.0)
Eosinophils Absolute: 0.1 K/uL (ref 0.0–0.7)
Eosinophils Relative: 1.1 % (ref 0.0–5.0)
HCT: 36.1 % (ref 36.0–46.0)
Hemoglobin: 11.9 g/dL — ABNORMAL LOW (ref 12.0–15.0)
Lymphocytes Relative: 52 % — ABNORMAL HIGH (ref 12.0–46.0)
Lymphs Abs: 2.6 K/uL (ref 0.7–4.0)
MCHC: 33 g/dL (ref 30.0–36.0)
MCV: 85.9 fl (ref 78.0–100.0)
Monocytes Absolute: 0.6 K/uL (ref 0.1–1.0)
Monocytes Relative: 12.1 % — ABNORMAL HIGH (ref 3.0–12.0)
Neutro Abs: 1.7 K/uL (ref 1.4–7.7)
Neutrophils Relative %: 34.2 % — ABNORMAL LOW (ref 43.0–77.0)
Platelets: 256 K/uL (ref 150.0–400.0)
RBC: 4.2 Mil/uL (ref 3.87–5.11)
RDW: 17.9 % — ABNORMAL HIGH (ref 11.5–15.5)
WBC: 4.9 K/uL (ref 4.0–10.5)

## 2024-10-23 LAB — IBC + FERRITIN
Ferritin: 6.6 ng/mL — ABNORMAL LOW (ref 10.0–291.0)
Iron: 32 ug/dL — ABNORMAL LOW (ref 42–145)
Saturation Ratios: 7.8 % — ABNORMAL LOW (ref 20.0–50.0)
TIBC: 408.8 ug/dL (ref 250.0–450.0)
Transferrin: 292 mg/dL (ref 212.0–360.0)

## 2024-10-23 LAB — B12 AND FOLATE PANEL
Folate: 13.9 ng/mL (ref >5.9)
Vitamin B-12: 260 pg/mL (ref 211–911)

## 2024-10-23 LAB — SEDIMENTATION RATE: Sed Rate: 48 mm/h — ABNORMAL HIGH (ref 0–20)

## 2024-10-23 LAB — C-REACTIVE PROTEIN: CRP: 2.6 mg/dL (ref 0.5–20.0)

## 2024-10-23 NOTE — Progress Notes (Signed)
 Chief Complaint:follow-up Crohns Primary GI Doctor:Dr. San  HPI: 28 y.o. 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   IBD history: She was diagnosed with Crohn's ileitis in August 2019 when hospitalized with abdominal pain, nausea, vomiting and unintentional weight loss.  EGD unremarkable.  Colonoscopy with mild terminal ileitis.  She responded very quickly to steroids but did not follow-up as directed following her hospitalization. She was seen by Dr. San in Oct 2019 with plans to start Remicade  vs. Entyvio, but never followed up.   She was rehospitalized June 2022 with anemia, heme positive stool and abdominal pain and underwent repeat GI evaluation at that time MR enterography showed active Crohn's of the distal and terminal ileum EGD negative Colonoscopy negative other than mild erythema at the terminal ileum, however biopsies showed active ileitis and random biopsies from the colon also showed findings consistent with active colitis. 03/22/2024 Hospital admission for Crohn's flare CT  Increased involvement of the ileum with wall thickening consistent with the given clinical history of Crohn's. No perforation or fistulization is seen.  EGD negative H. pylori gastritis.  Responded well to IV Solu-Medrol  negative TB Gold and hepatitis. 04/16/24 Seen in GI office by Alan, PA. Prednisone  taper refill given. Discussion about compliance. Given information on [facility] assistance. Plans to start Remicade .  EGD 03/25/2024: Normal esophagus.  Z-line regular.  Minimal nonulcer atrial gastritis.  Normal gastric fundus, gastric body, and incisura.  Gastroesophageal flap valve classified as Hill grade 3. normal examined duodenum. Neg biopsy. No h pylori.  Last colonoscopy: Colonoscopy June 2022 Mild patchy erythema in terminal ileum with biopsy showing active ileitis Colon appeared normal but biopsies with active  colitis. Last small bowel imaging:   MR enterography May 05, 2021 1. Wall thickening and abnormal enhancement of the distal and terminal ileum consistent with active Crohn's disease. 2. No other significant findings. 03/22/2024 CT ABP with contrast ncreased involvement of the ileum with wall thickening consistent with the given clinical history of Crohn's. No perforation or fistulization is seen. Extraintestinal manifestations:  Has bumps on tongue that burn, possible glossitis, bilateral knee pain/joint pain Surgical history: no surgery.  03/24/24 Negative hepatitis B surface antigen and TB during hospitalization  Other significant medical history:   Interval History Patient last seen in GI office on 04/16/24 by Alan, PA  Patient presents for follow-up on Crohn's disease.  Patient started Remicade  on July 2nd. She completed steroid taper after visit in [place]. She reports she has missed two Remicade  appointments,however was able to reschedule in given timeframe. Her next dose is due on 12/23. She reports she has not had any issues with the infusions. She reports 3 loose BM's per day. She has some urgency. She has intermittent lower abdominal cramping. No blood in stool.  She reports having a lot of fatigue. She works third shift and has two year old.  Appetite is good. No nausea or vomiting. She has gained 13lbs since October.   Her GERD/epigastric pain has improved, she feels it is exacerbated with her Crohns. Not currently taking any medication for it.   She has started taking the iron  supplement as recommended back in [place], but never started the b 12 supplement, no particular reason. Expressed importance of taking medication as recommended.  She also shares with me she was able to get [facility] for future doctors visits.   Wt Readings from Last 3 Encounters:  10/23/24 143 lb 6 oz (65 kg)  09/17/24 130 lb (59 kg)  08/20/24 118 lb 12.8 oz (53.9 kg)     Past Medical  History:  Diagnosis Date   Crohn disease (HCC)     Past Surgical History:  Procedure Laterality Date   BIOPSY  07/20/2018   Procedure: BIOPSY;  Surgeon: Rollin Dover, MD;  Location: [facility];  Service: Endoscopy;;   BIOPSY  05/04/2021   Procedure: BIOPSY;  Surgeon: Eda Iha, MD;  Location: [facility];  Service: Gastroenterology;;   CESAREAN SECTION N/A 09/06/2022   Procedure: CESAREAN SECTION;  Surgeon: Kandis Devaughn Sayres, MD;  Location: [facility] LD ORS;  Service: Obstetrics;  Laterality: N/A;   COLONOSCOPY WITH PROPOFOL  N/A 07/20/2018   Procedure: COLONOSCOPY WITH PROPOFOL ;  Surgeon: Rollin Dover, MD;  Location: [facility];  Service: Endoscopy;  Laterality: N/A;   COLONOSCOPY WITH PROPOFOL  N/A 05/04/2021   Procedure: COLONOSCOPY WITH PROPOFOL ;  Surgeon: Eda Iha, MD;  Location: [facility];  Service: Gastroenterology;  Laterality: N/A;   ESOPHAGOGASTRODUODENOSCOPY N/A 03/25/2024   Procedure: EGD (ESOPHAGOGASTRODUODENOSCOPY);  Surgeon: San Sandor GAILS, DO;  Location: [facility] ENDOSCOPY;  Service: Gastroenterology;  Laterality: N/A;   ESOPHAGOGASTRODUODENOSCOPY (EGD) WITH PROPOFOL  N/A 07/20/2018   Procedure: ESOPHAGOGASTRODUODENOSCOPY (EGD) WITH PROPOFOL ;  Surgeon: Rollin Dover, MD;  Location: [facility];  Service: Endoscopy;  Laterality: N/A;   ESOPHAGOGASTRODUODENOSCOPY (EGD) WITH PROPOFOL  N/A 05/04/2021   Procedure: ESOPHAGOGASTRODUODENOSCOPY (EGD) WITH PROPOFOL ;  Surgeon: Eda Iha, MD;  Location: [facility];  Service: Gastroenterology;  Laterality: N/A;    Current Outpatient Medications  Medication Sig Dispense Refill   acetaminophen  (TYLENOL ) 500 MG tablet Take 2 tablets (1,000 mg total) by mouth every 6 (six) hours as needed for moderate pain or mild pain. 60 tablet 2   No current facility-administered medications for this visit.    Allergies as of 10/23/2024   (No Known Allergies)    Family History  Problem Relation Age of Onset   Stroke Mother     Heart disease Father    GI Disease Neg Hx    Autoimmune disease Neg Hx    Asthma Neg Hx    Cancer Neg Hx    Diabetes Neg Hx     Review of Systems:    Constitutional: No weight loss, fever, chills, weakness or fatigue HEENT: Eyes: No change in vision               Ears, Nose, Throat:  No change in hearing or congestion Skin: No rash or itching Cardiovascular: No chest pain, chest pressure or palpitations   Respiratory: No SOB or cough Gastrointestinal: See HPI and otherwise negative Genitourinary: No dysuria or change in urinary frequency Neurological: No headache, dizziness or syncope Musculoskeletal: No new muscle or joint pain Hematologic: No bleeding or bruising Psychiatric: No history of depression or anxiety    Physical Exam:  Vital signs: BP 124/78   Pulse 67   Ht 5' 3 (1.6 m)   Wt 143 lb 6 oz (65 kg)   BMI 25.40 kg/m   Constitutional:   Pleasant A.A. female appears to be in NAD, Well developed, Well nourished, alert and cooperative Eyes:   PEERL, EOMI. No icterus. Conjunctiva pink. Neck:  Supple Throat: Oral cavity and pharynx without inflammation, swelling or lesion.  Respiratory: Respirations even and unlabored. Lungs clear to auscultation bilaterally.   No wheezes, crackles, or rhonchi.  Cardiovascular: Normal S1, S2. Regular rate and rhythm. No peripheral edema, cyanosis or pallor.  Gastrointestinal:  Soft, nondistended, generalized abd tenderness. No rebound or guarding. Normal bowel  sounds. No appreciable masses or hepatomegaly. Rectal:  Not performed.  Msk:  Symmetrical without gross deformities. Without edema, no deformity or joint abnormality.  Neurologic:  Alert and  oriented x4;  grossly normal neurologically.  Skin:   Dry and intact without significant lesions or rashes.  RELEVANT LABS AND IMAGING: CBC    Latest Ref Rng & Units 04/16/2024   11:27 AM 03/24/2024    8:14 AM 03/22/2024    7:08 AM  CBC  WBC 4.0 - 10.5 K/uL 4.5  4.2  5.2   Hemoglobin  12.0 - 15.0 g/dL 88.7  88.5  88.7   Hematocrit 36.0 - 46.0 % 34.1  35.0  34.6   Platelets 150.0 - 400.0 K/uL 386.0  384  306      CMP     Latest Ref Rng & Units 04/16/2024   11:27 AM 03/24/2024    8:14 AM 03/22/2024    7:08 AM  CMP  Glucose 70 - 99 mg/dL 67  87  96   BUN 6 - 23 mg/dL 11  10  7    Creatinine 0.40 - 1.20 mg/dL 9.40  9.28  9.27   Sodium 135 - 145 mEq/L 140  139  138   Potassium 3.5 - 5.1 mEq/L 3.3  4.0  3.6   Chloride 96 - 112 mEq/L 104  104  103   CO2 19 - 32 mEq/L 28  25  24    Calcium  8.4 - 10.5 mg/dL 9.0  8.9  8.2   Total Protein 6.0 - 8.3 g/dL 7.7   6.8   Total Bilirubin 0.2 - 1.2 mg/dL 0.4   0.6   Alkaline Phos 39 - 117 U/L 68   54   AST 0 - 37 U/L 7   11   ALT 0 - 35 U/L 7   10      Lab Results  Component Value Date   TSH 1.500 06/17/2018     Assessment/Plan: Encounter Diagnoses  Name Primary?   Crohn's disease with complication, unspecified gastrointestinal tract location (HCC) Yes   Iron  deficiency anemia, unspecified iron  deficiency anemia type    B12 deficiency    Other fatigue    Loss of weight    Gastroesophageal reflux disease without esophagitis    Abdominal pain, epigastric    28 year old African-American female patient with ileal Crohn's disease.  Patient was recently started on Remicade  in July and receiving infusions every 8 weeks.  Prior to start inflammatory marker CRP was 8.2 and sed rate was 92.  Patient also had iron  deficiency anemia with hemoglobin 11.2, iron  34, ferritin 83.  B12 314.  Patient was instructed to take both oral iron  and B12.  Patient has been taking the oral iron  however did not start the B12 supplement.  Reinforced the importance of doing this today.  Patient does experience a lot of fatigue.  Will go ahead and recheck her lab work and vitamin deficiencies today along with collecting a fecal calprotectin.  Will discuss with Dr. Cirigliano if you would like to get a drug level and antibody before her next infusion. She will  need follow-up colonoscopy at some point next year to evaluate for mucosal healing.  I spent a few extra minutes with the patient to encourage her to keep showing up to her appointments so that we can continue to manage her Crohn's disease.  I am hopeful with the patient assistance that this Jazsmin Couse make things a little bit easier for her financially.  #  1 Ileal Crohn's disease -Continue Remicade  every 8 weeks -Check CBC, CMET, Sed rate, CRP, iron  panel/TIBC,  vitamin b 12  -Collect fecal calprotectin  -will discuss with Dr. San if he wants Remicade  level and antibody level - Would suggest follow-up colonoscopy 6 to 12 months for mucosal healing  #2 Weight loss, appetite has improved, gained 13lbs #3 Epigastric pain/GERD- resolved #4 Iron  deficiency anemia #5 Fatigue - Continue iron  supplement p.o. daily -Will recheck level today #6 B12 deficiency anemia -Reinforced importance of taking B12 supplement -Will recheck today #7 Hypokalemia, post hospitalization in [place] -will recheck today #8 Socioeconomic hardship  -Patient was able to get [place] assistance.   -follow-up 3 months to monitor compliance with APP or Dr. San  Thank you for the courtesy of this consult. Please call me with any questions or concerns.   Zyriah Mask, FNP-C [facility] 10/23/2024, 1:08 PM  Cc: No ref. provider found

## 2024-10-23 NOTE — Patient Instructions (Addendum)
 B 12 deficiency  over-the-counter once daily can B complex or B12 supplement 1000 mcg daily    Iron  deficiency Continue iron  supplement   Your provider has requested that you go to the basement level for lab work before leaving today. Press B on the elevator. The lab is located at the first door on the left as you exit the elevator.  _______________________________________________________  If your blood pressure at your visit was 140/90 or greater, please contact your primary care physician to follow up on this.  _______________________________________________________  If you are age 28 or older, your body mass index should be between 23-30. Your Body mass index is 25.4 kg/m. If this is out of the aforementioned range listed, please consider follow up with your Primary Care Provider.  If you are age 42 or younger, your body mass index should be between 19-25. Your Body mass index is 25.4 kg/m. If this is out of the aformentioned range listed, please consider follow up with your Primary Care Provider.   ________________________________________________________  The [facility] providers would like to encourage you to use MYCHART to communicate with providers for non-urgent requests or questions.  Due to long hold times on the telephone, sending your provider a message by [facility] may be a faster and more efficient way to get a response.  Please allow 48 business hours for a response.  Please remember that this is for non-urgent requests.  _______________________________________________________  [facility] is using a team-based approach to care.  Your team is made up of your doctor and two to three APPS. Our APPS (Nurse Practitioners and Physician Assistants) work with your physician to ensure care continuity for you. They are fully qualified to address your health concerns and develop a treatment plan. They communicate directly with your gastroenterologist to care for you.  Seeing the Advanced Practice Practitioners on your physician's team can help you by facilitating care more promptly, often allowing for earlier appointments, access to diagnostic testing, procedures, and other specialty referrals.   Thank you for trusting me with your gastrointestinal care. Deanna May, FNP-C

## 2024-11-10 NOTE — Telephone Encounter (Signed)
-----   Message from [facility], NP sent at 10/30/2024  9:00 AM EST ----- [name]- can you follow-up with patient to see if she wants to do iv iron  for low iron  please.  Deanna, NP

## 2024-11-11 ENCOUNTER — Ambulatory Visit: Payer: Self-pay

## 2024-11-19 NOTE — Progress Notes (Signed)
 Agree with the assessment and plan as outlined by [employer] [facility], FNP-C.  Since she seems to be doing well on maintenance therapy, I think checking infliximab  trough and antibody level 1 week prior to the next infusion would be very helpful in proactive monitoring.  Yandriel Boening, DO, [facility]

## 2024-11-21 ENCOUNTER — Telehealth: Payer: Self-pay

## 2024-11-21 ENCOUNTER — Other Ambulatory Visit: Payer: Self-pay

## 2024-11-21 DIAGNOSIS — K50919 Crohn's disease, unspecified, with unspecified complications: Secondary | ICD-10-CM

## 2024-11-21 NOTE — Telephone Encounter (Signed)
 Message Received: Today Jennifer Cain, Jennifer J, NP  P Lbgi Pod B Triage Pod B- Please order drug monitoring infliximab  as recommended by Dr. San per his recommendations below. Please explain to patient how to correctly get them done.  [name], NP       Previous Messages  Office Visit for Crohn's Disease 10/23/2024 Jennifer Cain, Jennifer J, NP - [facility] Diagnoses  Crohn's Disease With Complication, Unspecified Gastrointestinal Tract Location (hcc) (Primary) Iron  deficiency anemia, unspecified iron  deficiency anemia type B12 deficiency Other fatigue Loss of weight Gastroesophageal reflux disease without esophagitis Abdominal pain, epigastric Orders Signed This Visit  (7) Orders Pended This Visit  None Progress Notes  Cirigliano, Vito V, DO at 10/23/2024 11:00 AM  Status: Signed  Agree with the assessment and plan as outlined by [facility] May, FNP-C.   Since she seems to be doing well on maintenance therapy, I think checking infliximab  trough and antibody level 1 week prior to the next infusion would be very helpful in proactive monitoring.   Vito Cirigliano, DO, [facility]

## 2024-11-21 NOTE — Telephone Encounter (Signed)
 Order placed in Epic.  Left message for pt to call back

## 2024-11-24 NOTE — Telephone Encounter (Signed)
 Pt made aware of recommendations.  Pt stated that she has an infusion this Wednesday. Pt was notified that she could come today and have the lab drawn. Location to lab provided.  Pt verbalized understanding with all questions answered.

## 2024-11-25 ENCOUNTER — Other Ambulatory Visit (INDEPENDENT_AMBULATORY_CARE_PROVIDER_SITE_OTHER): Payer: Self-pay

## 2024-11-25 DIAGNOSIS — K50919 Crohn's disease, unspecified, with unspecified complications: Secondary | ICD-10-CM

## 2024-11-26 ENCOUNTER — Ambulatory Visit: Payer: Self-pay

## 2024-11-30 LAB — SERIAL MONITORING

## 2024-12-02 LAB — INFLIXIMAB+AB (SERIAL MONITOR)
Anti-Infliximab Antibody: 1012 ng/mL
Infliximab Drug Level: <0.4 ug/mL

## 2024-12-05 ENCOUNTER — Ambulatory Visit: Payer: Self-pay
